# Patient Record
Sex: Female | Born: 1995 | Race: White | Hispanic: No | Marital: Married | Smoking: Former smoker
Health system: Southern US, Community
[De-identification: ages and names within clinical notes are randomized; demographics above are authoritative.]

## PROBLEM LIST (undated history)

## (undated) DIAGNOSIS — F3281 Premenstrual dysphoric disorder: Secondary | ICD-10-CM

## (undated) DIAGNOSIS — O99891 Other specified diseases and conditions complicating pregnancy: Secondary | ICD-10-CM

## (undated) DIAGNOSIS — Z8659 Personal history of other mental and behavioral disorders: Secondary | ICD-10-CM

## (undated) DIAGNOSIS — A6009 Herpesviral infection of other urogenital tract: Secondary | ICD-10-CM

## (undated) DIAGNOSIS — O9989 Other specified diseases and conditions complicating pregnancy, childbirth and the puerperium: Secondary | ICD-10-CM

## (undated) DIAGNOSIS — F329 Major depressive disorder, single episode, unspecified: Secondary | ICD-10-CM

## (undated) DIAGNOSIS — E7800 Pure hypercholesterolemia, unspecified: Secondary | ICD-10-CM

## (undated) DIAGNOSIS — F419 Anxiety disorder, unspecified: Secondary | ICD-10-CM

## (undated) DIAGNOSIS — F32A Depression, unspecified: Secondary | ICD-10-CM

## (undated) HISTORY — DX: Premenstrual dysphoric disorder: F32.81

## (undated) HISTORY — PX: CERVICAL BIOPSY: SHX590

## (undated) HISTORY — DX: Anxiety disorder, unspecified: F41.9

## (undated) HISTORY — DX: Pure hypercholesterolemia, unspecified: E78.00

## (undated) HISTORY — DX: Depression, unspecified: F32.A

## (undated) HISTORY — PX: OTHER SURGICAL HISTORY: SHX169

---
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
## 2015-02-09 ENCOUNTER — Emergency Department: Admit: 2015-02-09 | Disposition: A | Discharge status: Home / Self Care | Payer: Self-pay | Admitting: Internal Medicine

## 2015-02-09 LAB — MONONUCLEOSIS SCREEN: MONO TEST: NEGATIVE

## 2015-02-09 LAB — CBC
HCT: 41.1 % (ref 35.0–47.0)
HGB: 13.7 g/dL (ref 12.0–16.0)
MCH: 31.1 pg (ref 26.0–34.0)
MCHC: 33.2 g/dL (ref 32.0–36.0)
MCV: 94 fL (ref 80–100)
Platelet: 207 10*3/uL (ref 150–440)
RBC: 4.39 10*6/uL (ref 3.80–5.20)
RDW: 12.5 % (ref 11.5–14.5)
WBC: 14.7 10*3/uL — ABNORMAL HIGH (ref 3.6–11.0)

## 2015-02-11 LAB — BETA STREP CULTURE(ARMC)

## 2015-03-07 NOTE — H&P (Signed)
L&D Evaluation:  History:  HPI Pt is an 19 yo G1P0 at 40.[redacted] weeks GA. She presents to L&D with reports of ctx that started at 12:00pm today. She reports +FM, denies vb, lof. She is A+, VI, RI, and GBS-.   Presents with contractions   Patient's Medical History No Chronic Illness  hx of gonorrhea and chlamydia in 2011   Patient's Surgical History none   Medications Pre Natal Vitamins   Allergies NKDA   Social History none   Family History Non-Contributory   ROS:  ROS All systems were reviewed.  HEENT, CNS, GI, GU, Respiratory, CV, Renal and Musculoskeletal systems were found to be normal.   Exam:  Vital Signs stable   General no apparent distress   Mental Status clear   Chest clear   Heart normal sinus rhythm   Abdomen gravid, tender with contractions   Back no CVAT   Edema no edema   Pelvic cervix at 1525- 2/70/0   cervix at 1645 2/70/0   Mebranes Intact   FHT normal rate with no decels, cat 1 fht   Fetal Heart Rate 120   Ucx regular, 4-6 min   Skin dry, no lesions   Lymph no lymphadenopathy   Impression:  Impression reactive NST, IUP at 40.2, no cervical change   Plan:  Plan discharge, therapeutic rest, labor precautions given.   Follow Up Appointment need to schedule   Electronic Signatures: Jannet MantisSubudhi, Kacie Huxtable (CNM)  (Signed 28-Sep-14 17:00)  Authored: L&D Evaluation   Last Updated: 28-Sep-14 17:00 by Jannet MantisSubudhi, Alese Furniss (CNM)

## 2015-03-07 NOTE — H&P (Signed)
L&D Evaluation:  History Expanded:  HPI 19 yo G1P0 at 39 weeks w contractions since MN, mild and irreg, a little stronger this pm.  No vb or rom.  [facility].   Gravida 1   Term 0   Blood Type (Maternal) A positive   Maternal Varicella Immune   Rubella Results (Maternal) immune   Presents with contractions   Patient's Medical History No Chronic Illness   Patient's Surgical History none   Medications Pre Natal Vitamins   Allergies NKDA   Social History none   Family History Non-Contributory   ROS:  ROS All systems were reviewed.  HEENT, CNS, GI, GU, Respiratory, CV, Renal and Musculoskeletal systems were found to be normal.   Exam:  Vital Signs stable   General no apparent distress   Mental Status clear   Abdomen gravid, non-tender   Estimated Fetal Weight Average for gestational age   Back no CVAT   Edema no edema   Pelvic no external lesions, CL/70/-3   Mebranes Intact   FHT normal rate with no decels   Ucx irregular   Impression:  Impression early labor, vs false labor   Plan:  Plan EFM/NST, monitor contractions and for cervical change   Follow Up Appointment already scheduled   Electronic Signatures: Letitia LibraHarris, Jorrell Kuster Paul (MD)  (Signed 19-Sep-14 18:18)  Authored: L&D Evaluation   Last Updated: 19-Sep-14 18:18 by Letitia LibraHarris, Tarek Cravens Paul (MD)

## 2015-03-07 NOTE — H&P (Signed)
L&D Evaluation:  History:  HPI Pt is an 19 yo G1P0 at 40.[redacted] weeks GA by an EDC=07/23/2013 by LMP=10/16/2012. She presents to L&D with reports of ctx that started at 12:00pm yesterday and worsened this AM. She was seen yesterday in L&D and  was 3cm dilated.  On presentation she was 3 /80%/-1, but has now progressed to 5-6cm with ambulation and showering. She reports +FM, denies vb, lof.  She is A+, VI, RI, and GBS-. PNC remarkable for teen pregnancy, a first trimester ultrasound confirming dates, an echogenic intracardiac  focus in fetal heart with a normal fetal echo, and an elevated HSV IGGII (conversion since 2011). She was begun on Valtrex prophyllaxis during later pregnancy.   Presents with contractions   Patient's Medical History No Chronic Illness  hx of gonorrhea and chlamydia in 2011.  Positive HSV II  IGG   Patient's Surgical History none   Medications Pre Natal Vitamins  Valtrex 500 mgm daily   Allergies NKDA   Social History none   Family History Non-Contributory   ROS:  ROS see HPI   Exam:  Vital Signs stable   Urine Protein not completed   General breathing thru contractions   Mental Status clear   Chest clear   Heart normal sinus rhythm, no murmur/gallop/rubs   Abdomen gravid, tender with contractions   Estimated Fetal Weight Average for gestational age   Fetal Position cephalic   Edema trace   Pelvic no external lesions, 5-6/90%/0   Mebranes Intact   FHT normal rate with no decels, 130s with accels to 160s to 170s   FHT Description CAt 1   Ucx regular, q3-4 min apart   Skin dry   Impression:  Impression IUP at 40.3 weeks in active labor   Plan:  Plan EFM/NST, monitor contractions and for cervical change, Intermittent monitoring OK if up ambulating or in shower Stadol for pain if desires or epidural..   Electronic Signatures: Trinna BalloonGutierrez, Cully Luckow L (CNM)  (Signed 29-Sep-14 15:46)  Authored: L&D Evaluation   Last Updated: 29-Sep-14 15:46  by Trinna BalloonGutierrez, Shaunda Tipping L (CNM)

## 2015-07-31 ENCOUNTER — Ambulatory Visit: Payer: Self-pay | Admitting: Urology

## 2015-07-31 ENCOUNTER — Encounter: Payer: Self-pay | Admitting: Urology

## 2015-08-10 ENCOUNTER — Ambulatory Visit (INDEPENDENT_AMBULATORY_CARE_PROVIDER_SITE_OTHER): Payer: Self-pay | Admitting: Obstetrics and Gynecology

## 2015-08-10 ENCOUNTER — Encounter: Payer: Self-pay | Admitting: Obstetrics and Gynecology

## 2015-08-10 VITALS — BP 104/73 | HR 73 | Resp 16 | Ht 62.0 in | Wt 107.0 lb

## 2015-08-10 DIAGNOSIS — R300 Dysuria: Secondary | ICD-10-CM

## 2015-08-10 LAB — URINALYSIS, COMPLETE
BILIRUBIN UA: NEGATIVE
Glucose, UA: NEGATIVE
Ketones, UA: NEGATIVE
NITRITE UA: NEGATIVE
PH UA: 6.5 (ref 5.0–7.5)
Protein, UA: NEGATIVE
RBC, UA: NEGATIVE
Specific Gravity, UA: 1.015 (ref 1.005–1.030)
UUROB: 0.2 mg/dL (ref 0.2–1.0)

## 2015-08-10 LAB — MICROSCOPIC EXAMINATION
RBC MICROSCOPIC, UA: NONE SEEN /HPF (ref 0–?)
Renal Epithel, UA: NONE SEEN /hpf

## 2015-08-10 NOTE — Patient Instructions (Addendum)
Start daily probiotics Start taking antiviral medications daily for herpes outbreak prevention. Increase daily water intake and follow IC dietary recommendations. Keep a bladder diary for a few days to monitor your symptoms.  Follow up with your Gyn doctor as needed for heavy menses.

## 2015-08-10 NOTE — Progress Notes (Signed)
08/10/2015 11:53 AM   Chelsea Parks 1996/03/05 [id number]  Referring provider: No referring provider defined for this encounter.  Chief Complaint  Patient presents with  . Dysuria    [facility]  . Establish Care    HPI: Patient is a 19 year old female is in today with her mother as a referral from her primary care provider for 2 month history of urinary frequency and intermittent dysuria. She reports small volume voids approximately every 2 hours. Nocturia 1-2 times per night. She states that she recently stopped using her NuvaRing for contraception.  Patient was diagnosed and treated for chlamydial infection 2 months ago. She reports that her test of cure was negative. She also had a recent genital herpes outbreak which she reports has resolved. After treatment for her chlamydial infection she was also diagnosed with a vaginal yeast infection. She states that she has continued to experience some increase in vaginal discharge. She denies any vaginal itching or pain.  He reports that her general herpes was diagnosed approximately 2 years ago. She was prescribed daily suppressive therapy but states that she has not been taking it as directed and only takes it when she has an outbreak.  Distal symptoms include occasional stress urinary incontinence. Patient reports that symptoms have occurred since she gave birth to her baby 2 years ago.  Daily H2O intake little to none.  PMH: No past medical history on file.  Surgical History: Past Surgical History  Procedure Laterality Date  . No surgical history      Home Medications:    Medication List       This list is accurate as of: 08/10/15 11:53 AM.  Always use your most recent med list.               etonogestrel-ethinyl estradiol 0.12-0.015 MG/24HR vaginal ring  Commonly known as:  NUVARING  Place 1 each vaginally every 28 (twenty-eight) days. Insert vaginally and leave in place for 3  consecutive weeks, then remove for 1 week.        Allergies: No Known Allergies  Family History: Family History  Problem Relation Age of Onset  . Lupus Maternal Aunt   . Pancreatic cancer Maternal Uncle   . Stroke Maternal Grandfather   . Kidney cancer Neg Hx     Social History:  reports that she has never smoked. She does not have any smokeless tobacco history on file. She reports that she does not drink alcohol or use illicit drugs.  ROS: UROLOGY Frequent Urination?: Yes Hard to postpone urination?: Yes Burning/pain with urination?: Yes Get up at night to urinate?: Yes Leakage of urine?: Yes Urine stream starts and stops?: Yes Trouble starting stream?: Yes Do you have to strain to urinate?: Yes Blood in urine?: No Urinary tract infection?: No Sexually transmitted disease?: No Injury to kidneys or bladder?: No Painful intercourse?: No Weak stream?: Yes Currently pregnant?: No Vaginal bleeding?: No Last menstrual period?: n/a  Gastrointestinal Nausea?: Yes Vomiting?: No Indigestion/heartburn?: No Diarrhea?: No Constipation?: No  Constitutional Fever: No Night sweats?: No Weight loss?: No Fatigue?: Yes  Skin Skin rash/lesions?: No Itching?: No  Eyes Blurred vision?: No Double vision?: No  Ears/Nose/Throat Sore throat?: No Sinus problems?: No  Hematologic/Lymphatic Swollen glands?: No Easy bruising?: No  Cardiovascular Leg swelling?: No Chest pain?: No  Respiratory Cough?: No Shortness of breath?: No  Endocrine Excessive thirst?: No  Musculoskeletal Back pain?: No Joint pain?: No  Neurological Headaches?: Yes Dizziness?: No  Psychologic Depression?: No Anxiety?: No  Physical Exam: BP 104/73 mmHg  Pulse 73  Resp 16  Ht  (1.575 m)  Wt 107 lb (48.535 kg)  BMI 19.57 kg/m2  Constitutional:  Alert and oriented, No acute distress. HEENT: [place] AT, moist mucus membranes.  Trachea midline, no masses. Cardiovascular: No  clubbing, cyanosis, or edema. Respiratory: Normal respiratory effort, no increased work of breathing. GI: Abdomen is soft, nontender, nondistended, no abdominal masses GU: No CVA tenderness. Pelvic:normal external genitalia  Mild CMT, moderate cervical discharge (light green in color), no rashes or lesions, urethra normal, minimal cystocele with no demonstrable SUI Skin: No rashes, bruises or suspicious lesions. Lymph: No cervical or inguinal adenopathy. Neurologic: Grossly intact, no focal deficits, moving all 4 extremities. Psychiatric: Normal mood and affect.  Laboratory Data:   Urinalysis No results found for: COLORURINE, APPEARANCEUR, LABSPEC, PHURINE, GLUCOSEU, HGBUR, BILIRUBINUR, KETONESUR, PROTEINUR, UROBILINOGEN, NITRITE, LEUKOCYTESUR  Pertinent Imaging:  Assessment & Plan:    1. Dysuria/Urinary Frequency- Urinary frequency and intermittent dysuria over the last 2 months. UA unremarkable today. I believed patient's urinary symptoms are most likely due to residual urethral inflammation related to her recent vaginal chlamydial/yeast infections as well as genital herpes outbreak. She was instructed to significantly increase her daily water intake and start a probiotic. I also provided her with a bladder diary for her to document her symptoms and fluid intake for a few days to bring to her next appointment. Patient also provided a list of dietary bladder irritants to avoid. - Urinalysis, Complete -Urine Culture  3. H/o Chlamydial infection- History of recent chlamydial infection. Patient states that her test of cure was negative but today she is continues to have mild cervical motion tenderness as well as a light greenish discharge. I have sent her urine today for GC chlamydial testing.  4. Genital Herpes- Patient instructed to start taking her suppressive antivirals daily as directed.  5. SUI-  she reports minimal episodes of stress urinary Incontinence. Minimal cystocele noted on  exam today. I advised her to begin performing regular Ke[place] exercises to strengthen her pelvic floor.  Return in about 1 month (around 09/10/2015) for recheck frequency and dysuria.  These notes generated with voice recognition software. I apologize for typographical errors.  Earlie Lou, FNP  [facility] [address] [place], [place] [address] [phone]

## 2015-08-12 LAB — CHLAMYDIA/GONOCOCCUS/TRICHOMONAS, NAA
Chlamydia by NAA: POSITIVE — AB
GONOCOCCUS BY NAA: NEGATIVE
Trich vag by NAA: NEGATIVE

## 2015-08-13 LAB — CULTURE, URINE COMPREHENSIVE

## 2015-08-14 ENCOUNTER — Telehealth: Payer: Self-pay

## 2015-08-14 DIAGNOSIS — A749 Chlamydial infection, unspecified: Secondary | ICD-10-CM

## 2015-08-14 MED ORDER — AZITHROMYCIN 1 G PO PACK: 1.0000 g | PACK | Freq: Once | ORAL | Status: DC

## 2015-08-14 NOTE — Telephone Encounter (Signed)
Spoke with pt in reference to chlamydia infection. Made pt aware for the need of further treatment, no sexual contact for a week post tx, partner needs to be treated, and the need to see PCP. Pt voiced understanding. Medication sent to pharmacy. Infection reported to local health dept.

## 2015-08-14 NOTE — Telephone Encounter (Signed)
-----   Message from Fernanda DrumLindsay C Overton, FNP sent at 08/14/2015  8:47 AM EDT ----- Please notify patient that her urine culture was negative for infection.  Her chlamydia test did come back positive. Either the patient was not appropriately treated initially in the infection didn't not resolve or she has been reexposed. Please send in a prescription for her to take azithromycin 1 g as a single dose. She needs to avoid any sexual activity until at least one week after she takes her antibiotics. Her partner needs to also be treated prior to resuming sexual intercourse and she is to use condoms with every sexual encounter. She needs to see her primary care provider and be tested for all STI's because if she has been exposed to chlamydia she could have also been exposed to other infections or viruses. The continued infection is also likely contributing to patient's urinary symptoms. Please report infection to the CDC.  thanks

## 2015-08-24 ENCOUNTER — Telehealth: Payer: Self-pay

## 2015-08-24 ENCOUNTER — Other Ambulatory Visit: Payer: Self-pay

## 2015-08-24 DIAGNOSIS — A749 Chlamydial infection, unspecified: Secondary | ICD-10-CM

## 2015-08-24 MED ORDER — AZITHROMYCIN 1 G PO PACK: 1.0000 g | PACK | Freq: Once | ORAL | Status: DC

## 2015-08-24 NOTE — Telephone Encounter (Signed)
Pt called c/o that her abx was not called in to her pharmacy nor reported to the health dept. Nurse made pt aware [facility]x was [facility] [place] and her case had been reported to the Health Dept. Pt stated that "I spent a whole bunch of money to come see yall thinking it was something serious when really it was just chlamydia and now I have a big bill that I cant pay." Pt then went on to say that "I have had chlamydia for a long time and I just cant get rid of it. [facility] told me I did not have it and yall say I do, so I dont know what to do." Nurse reinforced with pt the need to [facility] at [facility], complete the therapy, and not to have any sexual contact until infection is cleared. Pt stated "well I have sex almost every day and have been since I was originally told I had chlamydia." Nurse reinforced with pt the need to complete abx therapy and use protection during intercourse. Pt stated "yeah well ok bye" and hung up.

## 2015-08-25 ENCOUNTER — Telehealth: Payer: Self-pay

## 2015-08-25 DIAGNOSIS — B379 Candidiasis, unspecified: Secondary | ICD-10-CM

## 2015-08-25 MED ORDER — FLUCONAZOLE 150 MG PO TABS: 150.0000 mg | ORAL_TABLET | Freq: Once | ORAL | Status: DC

## 2015-08-25 NOTE — Telephone Encounter (Signed)
Medication sent to pharmacy  

## 2015-08-25 NOTE — Telephone Encounter (Signed)
Pt called stating if she has to take the abx for chlamydia then she needs a diflucan. Please advise.

## 2015-08-25 NOTE — Telephone Encounter (Signed)
Please send in a Diflucan 150 mg once.  thanks

## 2015-09-15 ENCOUNTER — Ambulatory Visit: Payer: Self-pay | Admitting: Obstetrics and Gynecology

## 2015-09-15 ENCOUNTER — Encounter: Payer: Self-pay | Admitting: Obstetrics and Gynecology

## 2015-10-29 NOTE — L&D Delivery Note (Signed)
Delivery Note At 6:13 AM a viable female was delivered via Vaginal, Spontaneous Delivery (Presentation: Direct OA).  APGAR: 8, 9; weight 7 lb 13.2 oz (3550 g).   Placenta status: delivered spontaneously, intact with 3 vessel cord, .  Cord:  N/a with the following complications: none.    Anesthesia:  Epidural Episiotomy: None Lacerations: None Suture Repair: n/a Est. Blood Loss (mL): 300  Called to see patient.  Mom pushed to deliver a viable female infant.  The head followed by shoulders, which delivered without difficulty, and the rest of the body.  No nuchal cord noted.  Baby to mom's chest.  Cord clamped and cut after > 1 min delay.  No cord blood obtained.  Placenta delivered spontaneously, intact, with a 3-vessel cord.  No perineal lacerations noted.  All counts correct.  Hemostasis obtained with IV pitocin and fundal massage. EBL 300 mL.    Mom to postpartum.  Baby to Couplet care / Skin to Skin.  [name] 05/29/2016, 7:08 AM

## 2015-11-02 LAB — OB RESULTS CONSOLE RUBELLA ANTIBODY, IGM: Rubella: IMMUNE

## 2015-11-02 LAB — OB RESULTS CONSOLE HEPATITIS B SURFACE ANTIGEN: Hepatitis B Surface Ag: NEGATIVE

## 2015-11-02 LAB — OB RESULTS CONSOLE VARICELLA ZOSTER ANTIBODY, IGG: VARICELLA IGG: IMMUNE

## 2015-11-02 LAB — OB RESULTS CONSOLE HIV ANTIBODY (ROUTINE TESTING): HIV: NONREACTIVE

## 2016-03-04 LAB — OB RESULTS CONSOLE RPR: RPR: NONREACTIVE

## 2016-05-28 ENCOUNTER — Inpatient Hospital Stay: Payer: Medicaid Other | Admitting: Anesthesiology

## 2016-05-28 ENCOUNTER — Encounter: Payer: Self-pay | Admitting: Anesthesiology

## 2016-05-28 ENCOUNTER — Encounter: Payer: Self-pay | Admitting: Certified Nurse Midwife

## 2016-05-28 ENCOUNTER — Inpatient Hospital Stay
Admission: EM | Admit: 2016-05-28 | Discharge: 2016-05-30 | DRG: 775 | Disposition: A | Discharge status: Home / Self Care | Payer: Medicaid Other | Attending: Certified Nurse Midwife | Admitting: Certified Nurse Midwife

## 2016-05-28 DIAGNOSIS — Z823 Family history of stroke: Secondary | ICD-10-CM | POA: Diagnosis not present

## 2016-05-28 DIAGNOSIS — Z800 Family history of malignant neoplasm of digestive organs: Secondary | ICD-10-CM

## 2016-05-28 DIAGNOSIS — Z79899 Other long term (current) drug therapy: Secondary | ICD-10-CM | POA: Diagnosis not present

## 2016-05-28 DIAGNOSIS — Z3A40 40 weeks gestation of pregnancy: Secondary | ICD-10-CM | POA: Diagnosis not present

## 2016-05-28 HISTORY — DX: Personal history of other mental and behavioral disorders: Z86.59

## 2016-05-28 HISTORY — DX: Other specified diseases and conditions complicating pregnancy: O99.891

## 2016-05-28 HISTORY — DX: Other specified diseases and conditions complicating pregnancy, childbirth and the puerperium: O99.89

## 2016-05-28 HISTORY — DX: Herpesviral infection of other urogenital tract: A60.09

## 2016-05-28 LAB — TYPE AND SCREEN
ABO/RH(D): A POS
ANTIBODY SCREEN: NEGATIVE

## 2016-05-28 LAB — CBC
HCT: 36.3 % (ref 35.0–47.0)
Hemoglobin: 12.9 g/dL (ref 12.0–16.0)
MCH: 32.7 pg (ref 26.0–34.0)
MCHC: 35.4 g/dL (ref 32.0–36.0)
MCV: 92.5 fL (ref 80.0–100.0)
Platelets: 244 10*3/uL (ref 150–440)
RBC: 3.93 MIL/uL (ref 3.80–5.20)
RDW: 13.2 % (ref 11.5–14.5)
WBC: 21.3 10*3/uL — ABNORMAL HIGH (ref 3.6–11.0)

## 2016-05-28 LAB — CHLAMYDIA/NGC RT PCR (ARMC ONLY)
Chlamydia Tr: NOT DETECTED
N GONORRHOEAE: NOT DETECTED

## 2016-05-28 LAB — OB RESULTS CONSOLE GC/CHLAMYDIA
CHLAMYDIA, DNA PROBE: NEGATIVE
Gonorrhea: NEGATIVE

## 2016-05-28 MED ORDER — OXYTOCIN 40 UNITS IN LACTATED RINGERS INFUSION - SIMPLE MED
INTRAVENOUS | Status: AC
  Administered 2016-05-29: 2 m[IU]/min via INTRAVENOUS
  Filled 2016-05-28: qty 1000

## 2016-05-28 MED ORDER — MISOPROSTOL 200 MCG PO TABS
800.0000 ug | ORAL_TABLET | Freq: Once | ORAL | Status: DC | PRN
  Filled 2016-05-28: qty 4

## 2016-05-28 MED ORDER — BUTORPHANOL TARTRATE 1 MG/ML IJ SOLN
1.0000 mg | INTRAMUSCULAR | Status: DC | PRN
Start: 2016-05-28 — End: 2016-05-29
  Administered 2016-05-28: 1 mg via INTRAVENOUS
  Filled 2016-05-28: qty 1

## 2016-05-28 MED ORDER — FENTANYL 2.5 MCG/ML W/ROPIVACAINE 0.2% IN NS 100 ML EPIDURAL INFUSION (ARMC-ANES)
EPIDURAL | Status: DC | PRN
  Administered 2016-05-28: 9 mL/h via EPIDURAL
  Administered 2016-05-29: 250 ug via EPIDURAL

## 2016-05-28 MED ORDER — CEFAZOLIN SODIUM-DEXTROSE 2-3 GM-% IV SOLR
2.0000 g | Freq: Two times a day (BID) | INTRAVENOUS | Status: DC
  Administered 2016-05-28: 2 g via INTRAVENOUS
  Filled 2016-05-28 (×2): qty 50

## 2016-05-28 MED ORDER — AMMONIA AROMATIC IN INHA
RESPIRATORY_TRACT | Status: DC
Start: 2016-05-28 — End: 2016-05-29
  Filled 2016-05-28: qty 10

## 2016-05-28 MED ORDER — FENTANYL 2.5 MCG/ML W/ROPIVACAINE 0.2% IN NS 100 ML EPIDURAL INFUSION (ARMC-ANES)
EPIDURAL | Status: AC
  Filled 2016-05-28: qty 100

## 2016-05-28 MED ORDER — BUPIVACAINE HCL (PF) 0.25 % IJ SOLN
INTRAMUSCULAR | Status: DC | PRN
  Administered 2016-05-28 (×2): 4 mL via EPIDURAL

## 2016-05-28 MED ORDER — LIDOCAINE-EPINEPHRINE (PF) 1.5 %-1:200000 IJ SOLN
INTRAMUSCULAR | Status: DC | PRN
  Administered 2016-05-28: 3 mL

## 2016-05-28 MED ORDER — MISOPROSTOL 200 MCG PO TABS
ORAL_TABLET | ORAL | Status: DC
Start: 2016-05-28 — End: 2016-05-29
  Filled 2016-05-28: qty 4

## 2016-05-28 MED ORDER — LIDOCAINE HCL (PF) 1 % IJ SOLN: 30.0000 mL | INTRAMUSCULAR | Status: DC | PRN

## 2016-05-28 MED ORDER — OXYTOCIN 40 UNITS IN LACTATED RINGERS INFUSION - SIMPLE MED
2.5000 [IU]/h | INTRAVENOUS | Status: DC
  Filled 2016-05-28: qty 1000

## 2016-05-28 MED ORDER — LACTATED RINGERS IV SOLN
500.0000 mL | INTRAVENOUS | Status: DC | PRN
Start: 2016-05-28 — End: 2016-05-29

## 2016-05-28 MED ORDER — CEFAZOLIN SODIUM-DEXTROSE 2-4 GM/100ML-% IV SOLN
INTRAVENOUS | Status: AC
  Administered 2016-05-28: 2000 mg
  Filled 2016-05-28: qty 100

## 2016-05-28 MED ORDER — LACTATED RINGERS IV SOLN
INTRAVENOUS | Status: DC
  Administered 2016-05-28: 125 mL/h via INTRAVENOUS
  Administered 2016-05-29: 01:00:00 via INTRAVENOUS

## 2016-05-28 MED ORDER — ONDANSETRON HCL 4 MG/2ML IJ SOLN: 4.0000 mg | Freq: Four times a day (QID) | INTRAMUSCULAR | Status: DC | PRN

## 2016-05-28 MED ORDER — OXYTOCIN 10 UNIT/ML IJ SOLN
INTRAMUSCULAR | Status: DC
Start: 2016-05-28 — End: 2016-05-29
  Filled 2016-05-28: qty 2

## 2016-05-28 MED ORDER — AMMONIA AROMATIC IN INHA: 0.3000 mL | Freq: Once | RESPIRATORY_TRACT | Status: DC | PRN

## 2016-05-28 MED ORDER — FLUCONAZOLE 50 MG PO TABS
150.0000 mg | ORAL_TABLET | Freq: Once | ORAL | Status: AC
Start: 2016-05-28 — End: 2016-05-28
  Administered 2016-05-28: 150 mg via ORAL
  Filled 2016-05-28: qty 3

## 2016-05-28 MED ORDER — LIDOCAINE HCL (PF) 1 % IJ SOLN
INTRAMUSCULAR | Status: AC
  Filled 2016-05-28: qty 30

## 2016-05-28 MED ORDER — LACTATED RINGERS IV SOLN: 500.0000 mL | INTRAVENOUS | Status: DC | PRN

## 2016-05-28 MED ORDER — OXYTOCIN BOLUS FROM INFUSION
500.0000 mL | Freq: Once | INTRAVENOUS | Status: AC
  Administered 2016-05-29: 500 mL via INTRAVENOUS

## 2016-05-28 NOTE — H&P (Signed)
OB History & Physical   History of Present Illness:  Chief Complaint:  Strong contractions since 0730 this morning. HPI:  Chelsea Parks is a 20 y.o. G1P0 female with EDC=05/29/2016 at [redacted]w[redacted]d dated by a 11 week ultrasound.  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 Marland Kitchen  She is currently taking Valtrex daily for PPX. She presents to L&D for evaluation of labor.   She denies any bleeding or LOF. Has a little vulvar itching. Baby active.    Prenatal care site: Prenatal care at [facility] has also  been remarkable for a normal anatomy scan, receiving TDAP on 03/28/2016, and a history of postpartum depression with G1. Desires to breast and bottle feed.      Maternal Medical History:   Past Medical History:  Diagnosis Date  . H/O postpartum depression, currently pregnant   . Herpes genitalis in women     Past Surgical History:  Procedure Laterality Date  . no surgical history      No Known Allergies  Prior to Admission medications   Valtrex 500 mgm daily Prenatal vitamins daily          Social History: She  reports that she has never smoked. She does not have any smokeless tobacco history on file. She reports that she does not drink alcohol or use drugs.  Family History: family history includes Lupus in her maternal aunt; Pancreatic cancer in her maternal uncle; Stroke in her maternal grandfather.   Review of Systems: Negative x 10 systems reviewed except as noted in the HPI.      Physical Exam:  Vital Signs: BP 119/71   Pulse 97   Temp 97.6 F (36.4 C) (Oral)   Ht 5\' 2"  (1.575 m)   Wt 62.6 kg (138 lb)   BMI 25.24 kg/m  General: appears uncomfortable, breathing thru contractions. HEENT: normocephalic, atraumatic Heart: regular rate & rhythm.  No murmurs Lungs: clear to auscultation bilaterally Abdomen: soft, gravid, non-tender;  EFW: 7# Pelvic:   External: Normal external female genitalia, no lesions seen, discharge yellowish mucoid  Vagina:  yellow mucoid discharge  Wet prep positive for hyphae  Cervix: 1.5/90%/-2vtx on RN exam on arrival, and then 2 hours later  3/C/-1  Extremities: non-tender, symmetric,no edema bilaterally.  DTRs: +1 to +2  Neurologic: Alert & oriented x 3.    Pertinent Results:  Prenatal Labs: Blood type/Rh A positive  Antibody screen negative  Rubella Varicella Immune immune  RPR Non reactive  HBsAg negative  HIV nonreactive  GC negative  Chlamydia negative  Genetic screening CF negative  1 hour GTT 73  3 hour GTT NA  GBS negative on 05/03/2016   Baseline FHR: 120s with accelerations to 150 and moderate variability Toco: q2-20min apart with some coupling  Placental Location: posterior on anatomy scan  Assessment:  Chelsea Parks is a 20 y.o. G1P0 female at [redacted]w[redacted]d in early labor.   No evidence of herpetic lesions, monilial vulvovaginitis  Plan:  1. Admit to Labor & Delivery - notify attending   2. CBC, T&S, Clrs, IVF 3. GBS negative.   4. Consents obtained. 5. Diflucan x 1 dose  6. Epidural when appropriate  Kentaro Alewine  05/28/2016 12:43 PM

## 2016-05-28 NOTE — OB Triage Note (Signed)
G2 P1 EDC 05/29/2016 [facility] with complaint of uc's since 0730 this am, now Q 5 - 6 mins apart. Pt states active fetal movement noted this morning. Denies leaking fluid, or spotting. Pt placed on EFM for evaluation of contraction pattern Pt states GBS neg but will ask provider to assess records. Will notify [facility] CNM of patients arrival to unit. Ellison Carwin RNC

## 2016-05-28 NOTE — Anesthesia Preprocedure Evaluation (Signed)
Anesthesia Evaluation  Patient identified by MRN, date of birth, ID band Patient awake    Reviewed: Allergy & Precautions, H&P , Patient's Chart, lab work & pertinent test results  History of Anesthesia Complications Negative for: history of anesthetic complications  Airway Mallampati: II       Dental no notable dental hx. (+) Teeth Intact   Pulmonary    Pulmonary exam normal        Cardiovascular Normal cardiovascular exam     Neuro/Psych    GI/Hepatic negative GI ROS,   Endo/Other    Renal/GU      Musculoskeletal   Abdominal   Peds  Hematology negative hematology ROS (+)   Anesthesia Other Findings   Reproductive/Obstetrics (+) Pregnancy                             Anesthesia Physical Anesthesia Plan  ASA: II  Anesthesia Plan: Epidural   Post-op Pain Management:    Induction:   Airway Management Planned:   Additional Equipment:   Intra-op Plan:   Post-operative Plan:   Informed Consent: I have reviewed the patients History and Physical, chart, labs and discussed the procedure including the risks, benefits and alternatives for the proposed anesthesia with the patient or authorized representative who has indicated his/her understanding and acceptance.     Plan Discussed with: Anesthesiologist  Anesthesia Plan Comments:         Anesthesia Quick Evaluation

## 2016-05-28 NOTE — Anesthesia Procedure Notes (Addendum)
Epidural Patient location during procedure: OB Start time: 05/28/2016 2:30 PM End time: 05/28/2016 2:39 PM  Staffing Anesthesiologist: Yves Dill Resident/CRNA: Malva Cogan Performed: resident/CRNA   Preanesthetic Checklist Completed: patient identified, site marked, surgical consent, pre-op evaluation, IV checked, risks and benefits discussed and monitors and equipment checked  Epidural Patient position: sitting Prep: Betadine Patient monitoring: heart rate, continuous pulse ox and blood pressure Approach: midline Location: L3-L4 Injection technique: LOR saline  Needle:  Needle type: Tuohy  Needle gauge: 17 G Needle length: 9 cm Needle insertion depth: 5 cm Catheter type: closed end flexible Catheter size: 19 Gauge Catheter at skin depth: 9 cm Test dose: negative and 1.5% lidocaine with Epi 1:200 K  Assessment Events: blood not aspirated, injection not painful, no injection resistance, negative IV test and no paresthesia  Additional Notes Reason for block:procedure for pain

## 2016-05-29 ENCOUNTER — Encounter: Payer: Self-pay | Admitting: Obstetrics and Gynecology

## 2016-05-29 LAB — RPR: RPR: NONREACTIVE

## 2016-05-29 MED ORDER — IBUPROFEN 600 MG PO TABS
600.0000 mg | ORAL_TABLET | Freq: Four times a day (QID) | ORAL | Status: DC
  Administered 2016-05-29 (×2): 600 mg via ORAL
  Filled 2016-05-29 (×2): qty 1

## 2016-05-29 MED ORDER — ACETAMINOPHEN 325 MG PO TABS
650.0000 mg | ORAL_TABLET | ORAL | Status: DC | PRN
Start: 2016-05-29 — End: 2016-05-30

## 2016-05-29 MED ORDER — DIBUCAINE 1 % RE OINT: 1.0000 "application " | TOPICAL_OINTMENT | RECTAL | Status: DC | PRN

## 2016-05-29 MED ORDER — TERBUTALINE SULFATE 1 MG/ML IJ SOLN: 0.2500 mg | Freq: Once | INTRAMUSCULAR | Status: DC | PRN

## 2016-05-29 MED ORDER — FENTANYL 2.5 MCG/ML W/ROPIVACAINE 0.2% IN NS 100 ML EPIDURAL INFUSION (ARMC-ANES)
EPIDURAL | Status: AC
  Filled 2016-05-29: qty 100

## 2016-05-29 MED ORDER — ONDANSETRON HCL 4 MG/2ML IJ SOLN: 4.0000 mg | INTRAMUSCULAR | Status: DC | PRN

## 2016-05-29 MED ORDER — HYDROCODONE-ACETAMINOPHEN 5-325 MG PO TABS
1.0000 | ORAL_TABLET | Freq: Four times a day (QID) | ORAL | Status: DC | PRN
  Administered 2016-05-29: 1 via ORAL
  Filled 2016-05-29: qty 1

## 2016-05-29 MED ORDER — IBUPROFEN 600 MG PO TABS
ORAL_TABLET | ORAL | Status: AC
  Administered 2016-05-29: 600 mg
  Filled 2016-05-29: qty 1

## 2016-05-29 MED ORDER — ONDANSETRON HCL 4 MG PO TABS: 4.0000 mg | ORAL_TABLET | ORAL | Status: DC | PRN

## 2016-05-29 MED ORDER — DIPHENHYDRAMINE HCL 25 MG PO CAPS: 25.0000 mg | ORAL_CAPSULE | Freq: Four times a day (QID) | ORAL | Status: DC | PRN

## 2016-05-29 MED ORDER — OXYTOCIN 40 UNITS IN LACTATED RINGERS INFUSION - SIMPLE MED
1.0000 m[IU]/min | INTRAVENOUS | Status: DC
  Administered 2016-05-29: 2 m[IU]/min via INTRAVENOUS

## 2016-05-29 MED ORDER — BENZOCAINE-MENTHOL 20-0.5 % EX AERO: 1.0000 "application " | INHALATION_SPRAY | CUTANEOUS | Status: DC | PRN

## 2016-05-29 MED ORDER — PRENATAL MULTIVITAMIN CH
1.0000 | ORAL_TABLET | Freq: Every day | ORAL | Status: DC
  Administered 2016-05-29: 1 via ORAL
  Filled 2016-05-29: qty 1

## 2016-05-29 MED ORDER — FERROUS SULFATE 325 (65 FE) MG PO TABS
325.0000 mg | ORAL_TABLET | Freq: Two times a day (BID) | ORAL | Status: DC
  Administered 2016-05-29 – 2016-05-30 (×3): 325 mg via ORAL
  Filled 2016-05-29 (×3): qty 1

## 2016-05-29 MED ORDER — IBUPROFEN 600 MG PO TABS
600.0000 mg | ORAL_TABLET | Freq: Four times a day (QID) | ORAL | Status: DC
  Administered 2016-05-30 (×2): 600 mg via ORAL
  Filled 2016-05-29 (×2): qty 1

## 2016-05-29 MED ORDER — SENNOSIDES-DOCUSATE SODIUM 8.6-50 MG PO TABS
2.0000 | ORAL_TABLET | ORAL | Status: DC
  Administered 2016-05-29: 2 via ORAL
  Filled 2016-05-29: qty 2

## 2016-05-29 MED ORDER — SIMETHICONE 80 MG PO CHEW: 80.0000 mg | CHEWABLE_TABLET | ORAL | Status: DC | PRN

## 2016-05-29 MED ORDER — COCONUT OIL OIL: 1.0000 "application " | TOPICAL_OIL | Status: DC | PRN

## 2016-05-29 MED ORDER — WITCH HAZEL-GLYCERIN EX PADS: 1.0000 "application " | MEDICATED_PAD | CUTANEOUS | Status: DC | PRN

## 2016-05-29 NOTE — Discharge Summary (Signed)
OB Discharge Summary     Patient Name: Chelsea Parks DOB: 12/30/1995 MRN: [id number]  Date of admission: 05/28/2016 Delivering MD: [name], MD  Date of Delivery: 05/29/2016  Date of discharge: 05/30/16  Admitting diagnosis:  1) intrauterine pregnancy at [redacted]w[redacted]d  2) Labor  Intrauterine pregnancy: [redacted]w[redacted]d      Secondary diagnosis: None     Discharge diagnosis: Term Pregnancy Delivered                                                                                                Post partum procedures:none  Augmentation: Pitocin  Complications: None  Hospital course:  Onset of Labor With Vaginal Delivery     20 y.o. yo G1P0 at [redacted]w[redacted]d was admitted in Active Labor on 05/28/2016. Patient had an uncomplicated labor course as follows:  Membrane Rupture Time/Date: 3:00 PM ,05/28/2016   Intrapartum Procedures: Episiotomy: None [1]                                         Lacerations:  None [1]  Patient had a delivery of a Viable infant. 05/29/2016  Information for the patient's newborn:  Chelsea Parks [[id number]]       Pateint had an uncomplicated postpartum course.  She is ambulating, tolerating a regular diet, passing flatus, and urinating well. Patient is discharged home in stable condition on 05/29/16.    Physical exam  Vitals:   05/29/16 1918 05/29/16 2334 05/30/16 0325 05/30/16 0846  BP: 107/70 109/64 91/66 (!) 110/52  Pulse: (!) 108 96 84 93  Resp: 18 18 20 20   Temp: 98.1 F (36.7 C) 98 F (36.7 C) 97.8 F (36.6 C) 98 F (36.7 C)  TempSrc: Oral Oral Oral Oral  SpO2:    100%  Weight:      Height:       General: alert, cooperative and no distress Lochia: appropriate Uterine Fundus: firm Incision: N/A DVT Evaluation: No evidence of DVT seen on physical exam. No cords or calf tenderness. No significant calf/ankle edema.  Labs: Lab Results  Component Value Date   WBC 16.2 (H) 05/30/2016   HGB 11.1 (L) 05/30/2016   HCT 32.2 (L) 05/30/2016   MCV 94.7  05/30/2016   PLT 199 05/30/2016   Admission HCT: 36.3  No flowsheet data found.  Discharge instruction:  Discharge instructions:   Call office if you have any of the following: headache, visual changes, fever >100 F, chills, breast concerns, excessive vaginal bleeding, incision drainage or problems, leg pain or redness, depression or any other concerns.   Activity: Do not lift > 10 lbs for 6 weeks.  No intercourse or tampons for 6 weeks.  No driving for 1-2 weeks.    Medications:    Medication List    STOP taking these medications   valACYclovir 500 MG tablet Commonly known as:  VALTREX     TAKE these medications   ibuprofen 600 MG tablet Commonly known as:  ADVIL,MOTRIN Take 1 tablet (600 mg total)  by mouth every 6 (six) hours.   multivitamin-prenatal 27-0.8 MG Tabs tablet Take 1 tablet by mouth daily at 12 noon.        Diet: routine diet  Activity: Advance as tolerated. Pelvic rest for 6 weeks.   Outpatient follow up: Follow-up Information    [name], MD Follow up in 2 week(s).   Specialty:  Obstetrics and Gynecology Why:  pp depression check Contact information: [address] [place] [facility] [place] [address] [phone]            Postpartum contraception: Nexplanon Rhogam Given postpartum: no Rubella vaccine given postpartum: no Varicella vaccine given postpartum: no TDaP given antepartum or postpartum: AP  Newborn Data: Live born  Birth Weight:   APGAR: 8, 9  Baby Feeding: Bottle and Breast  Disposition:home with mother  SIGNED: 05/30/16 11:12 AM Marta Antu, CNM

## 2016-05-29 NOTE — Progress Notes (Signed)
Patient ID: Chelsea Parks, female   DOB: 1996-06-11, 20 y.o.   MRN: [id number] Labor Check  Subj:  Complaints: comfortable with epidural   Obj:  BP 95/69   Pulse (!) 111   Temp 99.4 F (37.4 C) (Oral)   Ht 5\' 2"  (1.575 m)   Wt 62.6 kg (138 lb)   BMI 25.24 kg/m     Cervix: Dilation: 8 / Effacement (%): 90 / Station: 0  Baseline FHR: 135    Variability: moderate    Accelerations: present    Decelerations: absent (distant variable decels) Contractions: present frequency: 3 q 10 min  A/P: 20 y.o. G1P0 female at [redacted]w[redacted]d with active labor, now arrested (slight change from before).  1.  Labor: add pitocin and she has made very slow progress.  2.  FWB: reassuring, Overall assessment: category 1  3.  GBS negative  4.  Pain: epidural 5.  Recheck: 2 hour prn   Thomasene Mohair, MD 05/29/2016 3:28 AM

## 2016-05-30 LAB — CBC
HEMATOCRIT: 32.2 % — AB (ref 35.0–47.0)
HEMOGLOBIN: 11.1 g/dL — AB (ref 12.0–16.0)
MCH: 32.5 pg (ref 26.0–34.0)
MCHC: 34.4 g/dL (ref 32.0–36.0)
MCV: 94.7 fL (ref 80.0–100.0)
Platelets: 199 10*3/uL (ref 150–440)
RBC: 3.41 MIL/uL — AB (ref 3.80–5.20)
RDW: 13 % (ref 11.5–14.5)
WBC: 16.2 10*3/uL — AB (ref 3.6–11.0)

## 2016-05-30 MED ORDER — IBUPROFEN 600 MG PO TABS: 600.0000 mg | ORAL_TABLET | Freq: Four times a day (QID) | ORAL | 0 refills | Status: DC

## 2016-05-30 NOTE — Progress Notes (Signed)
Discharge instructions complete and prescriptions given. Patient verbalizes understanding of teaching. Patient discharged home at 1330. 

## 2016-05-30 NOTE — Anesthesia Postprocedure Evaluation (Signed)
Anesthesia Post Note  Patient: Chelsea Parks  Procedure(s) Performed: * No procedures listed *  Patient location during evaluation: Mother Baby Anesthesia Type: Epidural Level of consciousness: awake and alert, oriented and patient cooperative Pain management: satisfactory to patient Vital Signs Assessment: post-procedure vital signs reviewed and stable Respiratory status: spontaneous breathing, respiratory function stable and nonlabored ventilation Cardiovascular status: blood pressure returned to baseline and stable Postop Assessment: no headache, no backache, patient able to bend at knees and no signs of nausea or vomiting Anesthetic complications: no    Last Vitals:  Vitals:   05/30/16 0325 05/30/16 0846  BP: 91/66 (!) 110/52  Pulse: 84 93  Resp: 20 20  Temp: 36.6 C 36.7 C    Last Pain:  Vitals:   05/30/16 0846  TempSrc: Oral  PainSc:                  Melton Krebs

## 2016-08-10 ENCOUNTER — Emergency Department
Admission: EM | Admit: 2016-08-10 | Discharge: 2016-08-10 | Disposition: A | Discharge status: Home / Self Care | Payer: Medicaid Other | Attending: Student | Admitting: Student

## 2016-08-10 DIAGNOSIS — N12 Tubulo-interstitial nephritis, not specified as acute or chronic: Secondary | ICD-10-CM | POA: Insufficient documentation

## 2016-08-10 DIAGNOSIS — R300 Dysuria: Secondary | ICD-10-CM | POA: Diagnosis present

## 2016-08-10 LAB — COMPREHENSIVE METABOLIC PANEL
ALT: 17 U/L (ref 14–54)
AST: 19 U/L (ref 15–41)
Albumin: 4.8 g/dL (ref 3.5–5.0)
Alkaline Phosphatase: 90 U/L (ref 38–126)
Anion gap: 10 (ref 5–15)
BUN: 13 mg/dL (ref 6–20)
CO2: 24 mmol/L (ref 22–32)
Calcium: 9.5 mg/dL (ref 8.9–10.3)
Chloride: 105 mmol/L (ref 101–111)
Creatinine, Ser: 0.84 mg/dL (ref 0.44–1.00)
GFR calc Af Amer: >60 mL/min (ref >60)
GFR calc non Af Amer: >60 mL/min (ref >60)
Glucose, Bld: 117 mg/dL — ABNORMAL HIGH (ref 65–99)
Potassium: 3.3 mmol/L — ABNORMAL LOW (ref 3.5–5.1)
Sodium: 139 mmol/L (ref 135–145)
Total Bilirubin: 1.2 mg/dL (ref 0.3–1.2)
Total Protein: 8 g/dL (ref 6.5–8.1)

## 2016-08-10 LAB — CBC
HEMATOCRIT: 39.9 % (ref 35.0–47.0)
HEMOGLOBIN: 13.7 g/dL (ref 12.0–16.0)
MCH: 31.5 pg (ref 26.0–34.0)
MCHC: 34.2 g/dL (ref 32.0–36.0)
MCV: 92.1 fL (ref 80.0–100.0)
Platelets: 228 10*3/uL (ref 150–440)
RBC: 4.33 MIL/uL (ref 3.80–5.20)
RDW: 13.1 % (ref 11.5–14.5)
WBC: 11.9 10*3/uL — ABNORMAL HIGH (ref 3.6–11.0)

## 2016-08-10 LAB — URINALYSIS COMPLETE WITH MICROSCOPIC (ARMC ONLY)
BILIRUBIN URINE: NEGATIVE
Glucose, UA: NEGATIVE mg/dL
Ketones, ur: NEGATIVE mg/dL
Nitrite: NEGATIVE
PH: 6 (ref 5.0–8.0)
Protein, ur: 100 mg/dL — AB
SPECIFIC GRAVITY, URINE: 1.009 (ref 1.005–1.030)

## 2016-08-10 LAB — POCT PREGNANCY, URINE: Preg Test, Ur: NEGATIVE

## 2016-08-10 LAB — LIPASE, BLOOD: Lipase: 23 U/L (ref 11–51)

## 2016-08-10 MED ORDER — FLUCONAZOLE 150 MG PO TABS: 150.0000 mg | ORAL_TABLET | Freq: Every day | ORAL | 0 refills | Status: AC

## 2016-08-10 MED ORDER — SODIUM CHLORIDE 0.9 % IV BOLUS (SEPSIS)
500.0000 mL | Freq: Once | INTRAVENOUS | Status: AC
  Administered 2016-08-10: 500 mL via INTRAVENOUS

## 2016-08-10 MED ORDER — IBUPROFEN 600 MG PO TABS: 600.0000 mg | ORAL_TABLET | Freq: Three times a day (TID) | ORAL | 0 refills | Status: DC | PRN

## 2016-08-10 MED ORDER — ONDANSETRON 4 MG PO TBDP: 4.0000 mg | ORAL_TABLET | Freq: Three times a day (TID) | ORAL | 0 refills | Status: DC | PRN

## 2016-08-10 MED ORDER — CEPHALEXIN 500 MG PO CAPS: 500.0000 mg | ORAL_CAPSULE | Freq: Two times a day (BID) | ORAL | 0 refills | Status: AC

## 2016-08-10 MED ORDER — CEFTRIAXONE SODIUM IN DEXTROSE 20 MG/ML IV SOLN
1.0000 g | Freq: Once | INTRAVENOUS | Status: AC
  Administered 2016-08-10: 1 g via INTRAVENOUS
  Filled 2016-08-10: qty 50

## 2016-08-10 NOTE — ED Triage Notes (Signed)
Pt arrives to ED with c/o "whole left side" abdominal pain with N/V/D that stated today. Pt reports her urine has had an "ammonia smell" X3-4 days. Pr also states she has cervical biopsy scheduled for this Thursday d/t an "abnormal HPV test". Pt states she had a BC implant placed 2-3 weeks ago and wonders if her s/x's could be related. Pt denies CP or SHOB.

## 2016-08-10 NOTE — ED Provider Notes (Addendum)
[facility] Emergency Department Provider Note   ____________________________________________   First MD Initiated Contact with Patient 08/10/16 0303     (approximate)  I have reviewed the triage vital signs and the nursing notes.   HISTORY  Chief Complaint Abdominal Pain; Dysuria; and Urinary Tract Infection    HPI Chelsea Parks is a 20 y.o. female with no chronic medical problems who presents for evaluation of 4 days painful urination, increased urinary frequency, urinary hesitancy, gradual onset, constant, severe, no modifying factors. She reports her urine has also had a very foul odor. Last night she also developed nonbloody nonbilious emesis. No diarrhea. No fevers or chills. She is having some left flank plain. She is approximately 2 months postpartum status post vaginal delivery which was uncomplicated. 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 He denies any history of kidney stones. She denies any chest pain or difficulty breathing. She is concerned that  her symptoms could be related to nexplanon which was implanted a few weeks ago. She is not breast-feeding.   Past Medical History:  Diagnosis Date  . H/O postpartum depression, currently pregnant   . Herpes genitalis in women     Patient Active Problem List   Diagnosis Date Noted  . Indication for care in labor or delivery 05/28/2016    Past Surgical History:  Procedure Laterality Date  . no surgical history      Prior to Admission medications   Medication Sig Start Date End Date Taking? Authorizing Provider  cephALEXin (KEFLEX) 500 MG capsule Take 1 capsule (500 mg total) by mouth 2 (two) times daily. 08/10/16 08/17/16  Gayla Doss, MD  fluconazole (DIFLUCAN) 150 MG tablet Take 1 tablet (150 mg total) by mouth daily. 08/10/16 08/11/16  Gayla Doss, MD  ibuprofen (ADVIL,MOTRIN) 600 MG tablet Take 1 tablet  (600 mg total) by mouth every 8 (eight) hours as needed for moderate pain. 08/10/16   Gayla Doss, MD  ondansetron (ZOFRAN ODT) 4 MG disintegrating tablet Take 1 tablet (4 mg total) by mouth every 8 (eight) hours as needed for nausea or vomiting. 08/10/16   Gayla Doss, MD  Prenatal Vit-Fe Fumarate-FA (MULTIVITAMIN-PRENATAL) 27-0.8 MG TABS tablet Take 1 tablet by mouth daily at 12 noon.    Historical Provider, MD    Allergies Review of patient's allergies indicates no known allergies.  Family History  Problem Relation Age of Onset  . Lupus Maternal Aunt   . Pancreatic cancer Maternal Uncle   . Stroke Maternal Grandfather   . Kidney cancer Neg Hx     Social History Social History  Substance Use Topics  . Smoking status: Never Smoker  . Smokeless tobacco: Never Used  . Alcohol use No    Review of Systems Constitutional: No fever/chills Eyes: No visual changes. ENT: No sore throat. Cardiovascular: Denies chest pain. Respiratory: Denies shortness of breath. Gastrointestinal: No abdominal pain.  + nausea, + vomiting.  No diarrhea.  No constipation. Genitourinary: Positive for dysuria. Musculoskeletal: Positive for left flank pain. Skin: Negative for rash. Neurological: Negative for headaches, focal weakness or numbness.  10-point ROS otherwise negative.  ____________________________________________   PHYSICAL EXAM:  Vitals:   08/10/16 0400 08/10/16 0415 08/10/16 0430 08/10/16 0503  BP: 109/69 103/68 105/63 110/67  Pulse: 79 77 90 84  Resp:    16  Temp:      TempSrc:  SpO2: 100% 99% 98% 99%  Weight:      Height:        VITAL SIGNS: ED Triage Vitals  Enc Vitals Group     BP 08/10/16 0048 118/78     Pulse Rate 08/10/16 0048 (!) 105     Resp 08/10/16 0048 16     Temp 08/10/16 0048 98.2 F (36.8 C)     Temp Source 08/10/16 0048 Oral     SpO2 08/10/16 0048 100 %     Weight 08/10/16 0050 135 lb (61.2 kg)     Height 08/10/16 0050 5\' 2"  (1.575 m)     Head  Circumference --      Peak Flow --      Pain Score 08/10/16 0050 8     Pain Loc --      Pain Edu? --      Excl. in GC? --     Constitutional: Alert and oriented. Well appearing and in no acute distress. Eyes: Conjunctivae are normal. PERRL. EOMI. Head: Atraumatic. Nose: No congestion/rhinnorhea. Mouth/Throat: Mucous membranes are moist.  Oropharynx non-erythematous. Neck: No stridor. Up without meningismus. Cardiovascular: Normal rate, regular rhythm. Grossly normal heart sounds.  Good peripheral circulation. Respiratory: Normal respiratory effort.  No retractions. Lungs CTAB. Gastrointestinal: Soft and nontender. No distention. Normal bowel sounds. No CVA tenderness. Genitourinary: deferred Musculoskeletal: No lower extremity tenderness nor edema.  No joint effusions. Neurologic:  Normal speech and language. No gross focal neurologic deficits are appreciated. No gait instability. Skin:  Skin is warm, dry and intact. No rash noted. Psychiatric: Mood and affect are normal. Speech and behavior are normal.  ____________________________________________   LABS (all labs ordered are listed, but only abnormal results are displayed)  Labs Reviewed  COMPREHENSIVE METABOLIC PANEL - Abnormal; Notable for the following:       Result Value   Potassium 3.3 (*)    Glucose, Bld 117 (*)    All other components within normal limits  CBC - Abnormal; Notable for the following:    WBC 11.9 (*)    All other components within normal limits  URINALYSIS COMPLETEWITH MICROSCOPIC ([facility] ONLY) - Abnormal; Notable for the following:    Color, Urine YELLOW (*)    APPearance CLOUDY (*)    Hgb urine dipstick 2+ (*)    Protein, ur 100 (*)    Leukocytes, UA 3+ (*)    Bacteria, UA RARE (*)    Squamous Epithelial / LPF 0-5 (*)    All other components within normal limits  URINE CULTURE  CULTURE, BLOOD (ROUTINE X 2)  CULTURE, BLOOD (ROUTINE X 2)  LIPASE, BLOOD  POC URINE PREG, ED  POCT PREGNANCY, URINE    ____________________________________________  EKG  none ____________________________________________  RADIOLOGY  none ____________________________________________   PROCEDURES  Procedure(s) performed: None  Procedures  Critical Care performed: No  ____________________________________________   INITIAL IMPRESSION / ASSESSMENT AND PLAN / ED COURSE  Pertinent labs & imaging results that were available during my care of the patient were reviewed by me and considered in my medical decision making (see chart for details).  Chelsea Parks is a 20 y.o. female with no chronic medical problems who presents for evaluation of 4 days painful urination, increased urinary frequency, urinary hesitancy and foul-smelling urine as well as new left flank pain and nausea vomiting. On arrival to the emergency department she was mildly tachycardic however that has resolved at the time of my assessment. The remainder of her vital signs are  stable and she is afebrile. She has a benign abdominal exam, no CVA tenderness. CBC shows a very mild leukocytosis, white blood cell count 11.9. On remarkable CMP. Urinalysis is concerning for urinary tract infection and I suspect early pyelonephritis. Negative pregnancy test. We'll give fluids, ceftriaxone and reassess.  ----------------------------------------- 4:59 AM on 08/10/2016 ----------------------------------------- Patient continues to appear well at this time. She is tolerating by mouth intake without vomiting. We'll discharge with Keflex, Zofran. We discussed meticulous return precautions and need for close PCP follow-up and she is comfortable with the discharge plan. DC home. She is also requesting Diflucan for prophylaxis against yeast vaginitis.  Clinical Course     ____________________________________________   FINAL CLINICAL IMPRESSION(S) / ED DIAGNOSES  Final diagnoses:  Pyelonephritis      NEW MEDICATIONS STARTED DURING THIS  VISIT:  New Prescriptions   CEPHALEXIN (KEFLEX) 500 MG CAPSULE    Take 1 capsule (500 mg total) by mouth 2 (two) times daily.   FLUCONAZOLE (DIFLUCAN) 150 MG TABLET    Take 1 tablet (150 mg total) by mouth daily.   IBUPROFEN (ADVIL,MOTRIN) 600 MG TABLET    Take 1 tablet (600 mg total) by mouth every 8 (eight) hours as needed for moderate pain.   ONDANSETRON (ZOFRAN ODT) 4 MG DISINTEGRATING TABLET    Take 1 tablet (4 mg total) by mouth every 8 (eight) hours as needed for nausea or vomiting.     Note:  This document was prepared using Dragon voice recognition software and may include unintentional dictation errors.    Gayla DossEryka A Laury Huizar, MD 08/10/16 [id number]    Gayla DossEryka A Danetta Prom, MD 08/10/16 [id number]

## 2016-08-12 LAB — URINE CULTURE

## 2016-09-06 ENCOUNTER — Encounter: Payer: Self-pay | Admitting: *Deleted

## 2016-09-06 ENCOUNTER — Emergency Department
Admission: EM | Admit: 2016-09-06 | Discharge: 2016-09-06 | Disposition: A | Discharge status: Home / Self Care | Payer: Medicaid Other | Attending: Emergency Medicine | Admitting: Emergency Medicine

## 2016-09-06 DIAGNOSIS — N3001 Acute cystitis with hematuria: Secondary | ICD-10-CM | POA: Diagnosis not present

## 2016-09-06 DIAGNOSIS — Z79899 Other long term (current) drug therapy: Secondary | ICD-10-CM | POA: Diagnosis not present

## 2016-09-06 DIAGNOSIS — R300 Dysuria: Secondary | ICD-10-CM | POA: Diagnosis present

## 2016-09-06 LAB — URINALYSIS COMPLETE WITH MICROSCOPIC (ARMC ONLY)
BILIRUBIN URINE: NEGATIVE
Glucose, UA: NEGATIVE mg/dL
KETONES UR: NEGATIVE mg/dL
NITRITE: NEGATIVE
PH: 6 (ref 5.0–8.0)
Protein, ur: NEGATIVE mg/dL
SPECIFIC GRAVITY, URINE: 1.023 (ref 1.005–1.030)

## 2016-09-06 MED ORDER — CIPROFLOXACIN HCL 500 MG PO TABS
ORAL_TABLET | ORAL | Status: AC
  Administered 2016-09-06: 500 mg via ORAL
  Filled 2016-09-06: qty 1

## 2016-09-06 MED ORDER — CIPROFLOXACIN HCL 500 MG PO TABS: 500.0000 mg | ORAL_TABLET | Freq: Two times a day (BID) | ORAL | 0 refills | Status: AC

## 2016-09-06 MED ORDER — CIPROFLOXACIN HCL 500 MG PO TABS
500.0000 mg | ORAL_TABLET | Freq: Once | ORAL | Status: AC
  Administered 2016-09-06: 500 mg via ORAL

## 2016-09-06 NOTE — ED Triage Notes (Signed)
Patient c/o burning sensation and has a strong ammonia smell to her urine.

## 2016-09-06 NOTE — ED Provider Notes (Signed)
[facility]-EMERGENCY DEPARTMENT Provider Note   CSN: [id number] Arrival date & time: 09/06/16  1959     History   Chief Complaint Chief Complaint  Patient presents with  . Urinary Tract Infection    HPI Chelsea Parks is a 20 y.o. female presents to emergency department for evaluation of possible urinary tract infection. Patient had pyelonephritis one month ago. She responded well to treatment and had been doing well up until today when she developed burning sensation with urination. She also noticed an ammonia smell with her urine. She denies any fevers, back pain, abdominal pain. Pain with urination is mild. She has slight increase in frequency.  Marland Kitchen. HPI  Past Medical History:  Diagnosis Date  . H/O postpartum depression, currently pregnant   . Herpes genitalis in women     Patient Active Problem List   Diagnosis Date Noted  . Indication for care in labor or delivery 05/28/2016    Past Surgical History:  Procedure Laterality Date  . CERVICAL BIOPSY    . no surgical history      OB History    Gravida Para Term Preterm AB Living   2 1 1     2    SAB TAB Ectopic Multiple Live Births         0         Home Medications    Prior to Admission medications   Medication Sig Start Date End Date Taking? Authorizing Provider  ciprofloxacin (CIPRO) 500 MG tablet Take 1 tablet (500 mg total) by mouth 2 (two) times daily. X 5 days 09/06/16 09/16/16  Evon Slackhomas C Joyclyn Plazola, PA-C  ibuprofen (ADVIL,MOTRIN) 600 MG tablet Take 1 tablet (600 mg total) by mouth every 8 (eight) hours as needed for moderate pain. 08/10/16   Gayla DossEryka A Gayle, MD  ondansetron (ZOFRAN ODT) 4 MG disintegrating tablet Take 1 tablet (4 mg total) by mouth every 8 (eight) hours as needed for nausea or vomiting. 08/10/16   Gayla DossEryka A Gayle, MD  Prenatal Vit-Fe Fumarate-FA (MULTIVITAMIN-PRENATAL) 27-0.8 MG TABS tablet Take 1 tablet by mouth daily at 12 noon.    Historical Provider, MD    Family History Family History  Problem  Relation Age of Onset  . Lupus Maternal Aunt   . Pancreatic cancer Maternal Uncle   . Stroke Maternal Grandfather   . Kidney cancer Neg Hx     Social History Social History  Substance Use Topics  . Smoking status: Never Smoker  . Smokeless tobacco: Never Used  . Alcohol use 0.0 oz/week     Comment: occasionally     Allergies   Patient has no known allergies.   Review of Systems Review of Systems  Constitutional: Negative for activity change, chills, fatigue and fever.  HENT: Negative for congestion, sinus pressure and sore throat.   Eyes: Negative for visual disturbance.  Respiratory: Negative for cough, chest tightness and shortness of breath.   Cardiovascular: Negative for chest pain and leg swelling.  Gastrointestinal: Negative for abdominal pain, diarrhea, nausea and vomiting.  Genitourinary: Positive for dysuria and frequency. Negative for flank pain.  Musculoskeletal: Negative for arthralgias and gait problem.  Skin: Negative for rash.  Neurological: Negative for weakness, numbness and headaches.  Hematological: Negative for adenopathy.  Psychiatric/Behavioral: Negative for agitation, behavioral problems and confusion.     Physical Exam Updated Vital Signs BP 107/77 (BP Location: Left Arm)   Pulse 78   Temp 98.1 F (36.7 C) (Oral)   Resp 18   Ht 5'  2" (1.575 m)   Wt 61.2 kg   SpO2 99%   BMI 24.69 kg/m   Physical Exam  Constitutional: She is oriented to person, place, and time. She appears well-developed and well-nourished. No distress.  HENT:  Head: Normocephalic and atraumatic.  Mouth/Throat: Oropharynx is clear and moist.  Eyes: EOM are normal. Pupils are equal, round, and reactive to light. Right eye exhibits no discharge. Left eye exhibits no discharge.  Neck: Normal range of motion. Neck supple.  Cardiovascular: Normal rate, regular rhythm and intact distal pulses.   Pulmonary/Chest: Effort normal and breath sounds normal. No respiratory distress.  She exhibits no tenderness.  Abdominal: Soft. She exhibits no distension and no mass. There is no tenderness. There is no guarding.  Musculoskeletal: Normal range of motion. She exhibits no edema.  Neurological: She is alert and oriented to person, place, and time. She has normal reflexes.  Skin: Skin is warm and dry.  Psychiatric: She has a normal mood and affect. Her behavior is normal. Thought content normal.     ED Treatments / Results  Labs (all labs ordered are listed, but only abnormal results are displayed) Labs Reviewed  URINALYSIS COMPLETEWITH MICROSCOPIC ([facility] ONLY) - Abnormal; Notable for the following:       Result Value   Color, Urine YELLOW (*)    APPearance HAZY (*)    Hgb urine dipstick 2+ (*)    Leukocytes, UA 2+ (*)    Bacteria, UA RARE (*)    Squamous Epithelial / LPF 0-5 (*)    All other components within normal limits    EKG  EKG Interpretation None       Radiology No results found.  Procedures Procedures (including critical care time)  Medications Ordered in ED Medications  ciprofloxacin (CIPRO) tablet 500 mg (not administered)     Initial Impression / Assessment and Plan / ED Course  I have reviewed the triage vital signs and the nursing notes.  Pertinent labs & imaging results that were available during my care of the patient were reviewed by me and considered in my medical decision making (see chart for details).  Clinical Course     20 year old female with urinary tract infection. No signs of pyelonephritis. Vital signs are normal. Urine cultures from one month ago show sensitivity to ciprofloxacin. We'll treat for 5 days. She will increase fluids. She is educated on signs and symptoms to return to the emergency department for.  Final Clinical Impressions(s) / ED Diagnoses   Final diagnoses:  Acute cystitis with hematuria    New Prescriptions New Prescriptions   CIPROFLOXACIN (CIPRO) 500 MG TABLET    Take 1 tablet (500 mg total)  by mouth 2 (two) times daily. X 5 days     Evon Slackhomas C Fin Hupp, PA-C 09/06/16 2106    Minna AntisKevin Paduchowski, MD 09/06/16 2255

## 2016-09-06 NOTE — Discharge Instructions (Signed)
Please take medications as prescribed. Drink lots of fluids. Return to the ER for any worsening symptoms urgent changes in her health such as fevers, back pain nausea or vomiting.

## 2017-01-02 ENCOUNTER — Ambulatory Visit (INDEPENDENT_AMBULATORY_CARE_PROVIDER_SITE_OTHER): Payer: Medicaid Other | Admitting: Obstetrics and Gynecology

## 2017-01-02 ENCOUNTER — Encounter: Payer: Self-pay | Admitting: Obstetrics and Gynecology

## 2017-01-02 VITALS — BP 119/76 | HR 109 | Ht 62.0 in | Wt 136.5 lb

## 2017-01-02 DIAGNOSIS — Z3009 Encounter for other general counseling and advice on contraception: Secondary | ICD-10-CM

## 2017-01-02 LAB — POCT URINE PREGNANCY: Preg Test, Ur: NEGATIVE

## 2017-01-02 MED ORDER — MEDROXYPROGESTERONE ACETATE 150 MG/ML IM SUSP: 150.0000 mg | INTRAMUSCULAR | 3 refills | Status: DC

## 2017-01-02 NOTE — Progress Notes (Signed)
GYNECOLOGY CLINIC PROGRESS NOTE  Subjective:    Chelsea Parks is a 21 y.o. [id number] female who presents for contraception counseling. The patient has no complaints today. The patient is sexually active. Pertinent past medical history: none.  Patient states that she was on the pill but her periods were getting worse.  She has tried Depo Provera and NuvaRing in the past which has worked well for her. Also inquires into LARC contraception.   Menstrual History: OB History    Gravida Para Term Preterm AB Living   2 2 2     2    SAB TAB Ectopic Multiple Live Births         0 2      Menarche age: 24 Patient's last menstrual period was 12/05/2016. Period Cycle (Days): 28 Period Duration (Days): 4-5 Period Pattern: Regular Menstrual Flow: Moderate Dysmenorrhea: (!) Severe Dysmenorrhea Symptoms: Cramping, Diarrhea  Past Medical History:  Diagnosis Date  . H/O postpartum depression, currently pregnant   . Herpes genitalis in women     Family History  Problem Relation Age of Onset  . Lupus Maternal Aunt   . Pancreatic cancer Maternal Uncle   . Stroke Maternal Grandfather   . Kidney cancer Neg Hx     Past Surgical History:  Procedure Laterality Date  . CERVICAL BIOPSY    . no surgical history      Social History   Social History  . Marital status: Married    Spouse name: N/A  . Number of children: N/A  . Years of education: N/A   Occupational History  . Not on file.   Social History Main Topics  . Smoking status: Never Smoker  . Smokeless tobacco: Never Used  . Alcohol use 0.0 oz/week     Comment: occasionally  . Drug use: No  . Sexual activity: Yes    Birth control/ protection: Pill   Other Topics Concern  . Not on file   Social History Narrative  . No narrative on file    No current outpatient prescriptions on file prior to visit.   No current facility-administered medications on file prior to visit.     No Known Allergies   Review of Systems A  comprehensive review of systems was negative.   Objective:    BP 119/76 (BP Location: Left Arm, Patient Position: Sitting, Cuff Size: Normal)   Pulse (!) 109   Ht 5\' 2"  (1.575 m)   Wt 136 lb 8 oz (61.9 kg)   LMP 12/05/2016   Breastfeeding? No   BMI 24.97 kg/m  General appearance: alert and no distress Neck: no adenopathy, no carotid bruit, no JVD, supple, symmetrical, trachea midline and thyroid not enlarged, symmetric, no tenderness/mass/nodules Lungs: clear to auscultation bilaterally Heart: regular rate and rhythm, S1, S2 normal, no murmur, click, rub or gallop Abdomen: soft, non-tender; bowel sounds normal; no masses,  no organomegaly Pelvic: deferred Extremities: extremities normal, atraumatic, no cyanosis or edema Neurologic: Grossly normal   Assessment:   Contraception counseling  Plan:   - Reviewed all forms of birth control options available including abstinence; over the counter/barrier methods; hormonal contraceptive medication including pill, patch, ring, injection,contraceptive implant; hormonal and nonhormonal IUDs; permanent sterilization options including vasectomy and the various tubal sterilization modalities. Risks and benefits reviewed.  Questions were answered.  Information was given to patient to review.  Patient currently considering IUD (non-hormonal vs low-dose hormonal), but also considering resuming Depo Provera.  As patient is unsure at this point and  desires time to think it over, offered to prescribe an injection of Depo Provera which will give her up 3 months to make a decision as to whether she will continue Depo Procera or switch to an IUD.    - To f/u in the next several days for Depo Provera injection.    A total of 20 minutes were spent face-to-face with the patient during this encounter and over half of that time dealt with counseling and coordination of care.   Hildred LaserAnika Undrea Archbold, MD [facility]

## 2017-01-03 ENCOUNTER — Telehealth: Payer: Self-pay

## 2017-01-03 ENCOUNTER — Ambulatory Visit (INDEPENDENT_AMBULATORY_CARE_PROVIDER_SITE_OTHER): Payer: Medicaid Other | Admitting: Obstetrics and Gynecology

## 2017-01-03 VITALS — BP 107/75 | HR 90 | Wt 139.2 lb

## 2017-01-03 DIAGNOSIS — Z30013 Encounter for initial prescription of injectable contraceptive: Secondary | ICD-10-CM | POA: Diagnosis not present

## 2017-01-03 MED ORDER — MEDROXYPROGESTERONE ACETATE 150 MG/ML IM SUSP
150.0000 mg | Freq: Once | INTRAMUSCULAR | Status: AC
  Administered 2017-01-03: 150 mg via INTRAMUSCULAR

## 2017-01-03 NOTE — Progress Notes (Signed)
Patient ID: Chelsea Parks, female   DOB: 20-Feb-1996, 21 y.o.   MRN: [id number] Pt presents for first depo-provera injection for contraception. Was seen yesterday in office and UPT-negative. Did not repeat today.

## 2017-01-03 NOTE — Telephone Encounter (Signed)
error 

## 2017-03-28 ENCOUNTER — Ambulatory Visit: Payer: Medicaid Other

## 2017-04-07 ENCOUNTER — Encounter: Payer: Self-pay | Admitting: Certified Nurse Midwife

## 2017-04-07 ENCOUNTER — Ambulatory Visit (INDEPENDENT_AMBULATORY_CARE_PROVIDER_SITE_OTHER): Payer: Medicaid Other | Admitting: Certified Nurse Midwife

## 2017-04-07 VITALS — BP 99/69 | HR 94 | Wt 137.5 lb

## 2017-04-07 DIAGNOSIS — Z3042 Encounter for surveillance of injectable contraceptive: Secondary | ICD-10-CM | POA: Diagnosis not present

## 2017-04-07 LAB — POCT URINE PREGNANCY: PREG TEST UR: NEGATIVE

## 2017-04-07 MED ORDER — MEDROXYPROGESTERONE ACETATE 150 MG/ML IM SUSP
150.0000 mg | Freq: Once | INTRAMUSCULAR | Status: AC
  Administered 2017-04-07: 150 mg via INTRAMUSCULAR

## 2017-04-07 NOTE — Patient Instructions (Addendum)
Medroxyprogesterone injection [Contraceptive] What is this medicine? MEDROXYPROGESTERONE (me DROX ee proe JES te rone) contraceptive injections prevent pregnancy. They provide effective birth control for 3 months. Depo-subQ Provera 104 is also used for treating pain related to endometriosis. This medicine may be used for other purposes; ask your health care provider or pharmacist if you have questions. COMMON BRAND NAME(S): Depo-Provera, Depo-subQ Provera 104 What should I tell my health care provider before I take this medicine? They need to know if you have any of these conditions: -frequently drink alcohol -asthma -blood vessel disease or a history of a blood clot in the lungs or legs -bone disease such as osteoporosis -breast cancer -diabetes -eating disorder (anorexia nervosa or bulimia) -high blood pressure -HIV infection or AIDS -kidney disease -liver disease -mental depression -migraine -seizures (convulsions) -stroke -tobacco smoker -vaginal bleeding -an unusual or allergic reaction to medroxyprogesterone, other hormones, medicines, foods, dyes, or preservatives -pregnant or trying to get pregnant -breast-feeding How should I use this medicine? Depo-Provera Contraceptive injection is given into a muscle. Depo-subQ Provera 104 injection is given under the skin. These injections are given by a health care professional. You must not be pregnant before getting an injection. The injection is usually given during the first 5 days after the start of a menstrual period or 6 weeks after delivery of a baby. Talk to your pediatrician regarding the use of this medicine in children. Special care may be needed. These injections have been used in female children who have started having menstrual periods. Overdosage: If you think you have taken too much of this medicine contact a poison control center or emergency room at once. NOTE: This medicine is only for you. Do not share this medicine  with others. What if I miss a dose? Try not to miss a dose. You must get an injection once every 3 months to maintain birth control. If you cannot keep an appointment, call and reschedule it. If you wait longer than 13 weeks between Depo-Provera contraceptive injections or longer than 14 weeks between Depo-subQ Provera 104 injections, you could get pregnant. Use another method for birth control if you miss your appointment. You may also need a pregnancy test before receiving another injection. What may interact with this medicine? Do not take this medicine with any of the following medications: -bosentan This medicine may also interact with the following medications: -aminoglutethimide -antibiotics or medicines for infections, especially rifampin, rifabutin, rifapentine, and griseofulvin -aprepitant -barbiturate medicines such as phenobarbital or primidone -bexarotene -carbamazepine -medicines for seizures like ethotoin, felbamate, oxcarbazepine, phenytoin, topiramate -modafinil -St. John's wort This list may not describe all possible interactions. Give your health care provider a list of all the medicines, herbs, non-prescription drugs, or dietary supplements you use. Also tell them if you smoke, drink alcohol, or use illegal drugs. Some items may interact with your medicine. What should I watch for while using this medicine? This drug does not protect you against HIV infection (AIDS) or other sexually transmitted diseases. Use of this product may cause you to lose calcium from your bones. Loss of calcium may cause weak bones (osteoporosis). Only use this product for more than 2 years if other forms of birth control are not right for you. The longer you use this product for birth control the more likely you will be at risk for weak bones. Ask your health care professional how you can keep strong bones. You may have a change in bleeding pattern or irregular periods. Many females stop having    periods while taking this drug. If you have received your injections on time, your chance of being pregnant is very low. If you think you may be pregnant, see your health care professional as soon as possible. Tell your health care professional if you want to get pregnant within the next year. The effect of this medicine may last a long time after you get your last injection. What side effects may I notice from receiving this medicine? Side effects that you should report to your doctor or health care professional as soon as possible: -allergic reactions like skin rash, itching or hives, swelling of the face, lips, or tongue -breast tenderness or discharge -breathing problems -changes in vision -depression -feeling faint or lightheaded, falls -fever -pain in the abdomen, chest, groin, or leg -problems with balance, talking, walking -unusually weak or tired -yellowing of the eyes or skin Side effects that usually do not require medical attention (report to your doctor or health care professional if they continue or are bothersome): -acne -fluid retention and swelling -headache -irregular periods, spotting, or absent periods -temporary pain, itching, or skin reaction at site where injected -weight gain This list may not describe all possible side effects. Call your doctor for medical advice about side effects. You may report side effects to FDA at 1-800-FDA-1088. Where should I keep my medicine? This does not apply. The injection will be given to you by a health care professional. NOTE: This sheet is a summary. It may not cover all possible information. If you have questions about this medicine, talk to your doctor, pharmacist, or health care provider.  2018 Elsevier/Gold Standard (2008-11-04 18:37:56) Hormonal Contraception Information Hormonal contraception is a type of birth control that uses hormones to prevent pregnancy. It usually involves a combination of the hormones estrogen and  progesterone or only the hormone progesterone. Hormonal contraception works in these ways:  It thickens the mucus in the cervix, making it harder for sperm to enter the uterus.  It changes the lining of the uterus, making it harder for an egg to implant.  It may stop the ovaries from releasing eggs (ovulation). Some women who take hormonal contraceptives that contain only progesterone may continue to ovulate.  Hormonal contraception cannot prevent sexually transmitted infections (STIs). Pregnancy may still occur. Estrogen and progesterone contraceptives Contraceptives that use a combination of estrogen and progesterone are available in these forms:  Pill. Pills come in different combinations of hormones. They must be taken at the same time each day. Pills can affect your period, causing you to get your period once every three months or not at all.  Patch. The patch must be worn on the lower abdomen for three weeks and then removed on the fourth.  Vaginal ring. The ring is placed in the vagina and left there for three weeks. It is then removed for one week.  Progesterone contraceptives Contraceptives that use progesterone only are available in these forms:  Pill. Pills should be taken every day of the cycle.  Intrauterine device (IUD). This device is inserted into the uterus and removed or replaced every five years or sooner.  Implant. Plastic rods are placed under the skin of the upper arm. They are removed or replaced every three years or sooner.  Injection. The injection is given once every 90 days.  What are the side effects? The side effects of estrogen and progesterone contraceptives include:  Nausea.  Headaches.  Breast tenderness.  Bleeding or spotting between menstrual cycles.  High blood pressure (rare).  Strokes, heart attacks, or blood clots (rare)  Side effects of progesterone-only contraceptives include:  Nausea.  Headaches.  Breast  tenderness.  Unpredictable menstrual bleeding.  High blood pressure (rare).  Talk to your health care provider about what side effects may affect you. Where to find more information:  Ask your health care provider for more information and resources about hormonal contraception.  U.S. Department of Health and CytogeneticistHuman Services Office on Women's Health: http://hoffman.com/www.womenshealth.gov Questions to ask:  What type of hormonal contraception is right for me?  How long should I plan to use hormonal contraception?  What are the side effects of the hormonal contraception method I choose?  How can I prevent STIs while using hormonal contraception? Contact a health care provider if:  You start taking hormonal contraceptives and you develop persistent or severe side effects. Summary  Estrogen and progesterone are hormones used in many forms of birth control.  Talk to your health care provider about what side effects may affect you.  Hormonal contraception cannot prevent sexually transmitted infections (STIs).  Ask your health care provider for more information and resources about hormonal contraception. This information is not intended to replace advice given to you by your health care provider. Make sure you discuss any questions you have with your health care provider. Document Released: 11/03/2007 Document Revised: 09/13/2016 Document Reviewed: 09/13/2016 Elsevier Interactive Patient Education  [employer]2018 Elsevier Inc.

## 2017-04-07 NOTE — Progress Notes (Signed)
Pt is here for depo provera inj, she is doing well, denies any complaints

## 2017-04-07 NOTE — Progress Notes (Signed)
I have reviewed the record and concur with patient management and plan.    Jenkins Michelle Lawhorn, CNM [facility], [facility] 

## 2017-04-17 ENCOUNTER — Ambulatory Visit (INDEPENDENT_AMBULATORY_CARE_PROVIDER_SITE_OTHER): Payer: Medicaid Other | Admitting: Obstetrics and Gynecology

## 2017-04-17 ENCOUNTER — Encounter: Payer: Self-pay | Admitting: Obstetrics and Gynecology

## 2017-04-17 VITALS — BP 103/72 | HR 82 | Temp 98.2°F | Ht 62.0 in | Wt 137.3 lb

## 2017-04-17 DIAGNOSIS — N3000 Acute cystitis without hematuria: Secondary | ICD-10-CM

## 2017-04-17 LAB — POCT URINALYSIS DIPSTICK
GLUCOSE UA: NEGATIVE
Ketones, UA: NEGATIVE
Nitrite, UA: POSITIVE
Protein, UA: NEGATIVE
Spec Grav, UA: 1.01 (ref 1.010–1.025)
UROBILINOGEN UA: 0.2 U/dL
pH, UA: 6.5 (ref 5.0–8.0)

## 2017-04-17 MED ORDER — FLUCONAZOLE 150 MG PO TABS: 150.0000 mg | ORAL_TABLET | Freq: Once | ORAL | 2 refills | Status: AC

## 2017-04-17 MED ORDER — CIPROFLOXACIN HCL 500 MG PO TABS: 500.0000 mg | ORAL_TABLET | Freq: Two times a day (BID) | ORAL | 0 refills | Status: DC

## 2017-04-17 NOTE — Progress Notes (Signed)
    Subjective:    Chelsea Parks is a 21 y.o. female who complains of urinary frequency, abnormal smelling urine and burning with urination. She has had symptoms for 4 days. Patient denies back pain, fever and vaginal discharge. Patient does not have a history of recurrent UTI. Patient does not have a history of pyelonephritis.  Has been trying to self treat with Azo and increased hydration.  The following portions of the patient's history were reviewed and updated as appropriate: allergies, current medications, past family history, past medical history, past social history, past surgical history and problem list.  Review of Systems Pertinent items noted in HPI and remainder of comprehensive ROS otherwise negative.    Objective:    BP 103/72   Pulse 82   Ht 5\' 2"  (1.575 m)   Wt 137 lb 4.8 oz (62.3 kg)   LMP  (LMP Unknown)   Breastfeeding? No   BMI 25.11 kg/m  General appearance: alert and no distress Back: symmetric, no curvature. ROM normal. No CVA tenderness. Abdomen: soft, non-tender; bowel sounds normal; no masses,  no organomegaly Pelvic: deferred   Laboratory:  Results for orders placed or performed in visit on 04/17/17  POCT urinalysis dipstick  Result Value Ref Range   Color, UA dark yellow    Clarity, UA clear    Glucose, UA neg    Bilirubin, UA 1+    Ketones, UA neg    Spec Grav, UA 1.010 1.010 - 1.025   Blood, UA trace non hem    pH, UA 6.5 5.0 - 8.0   Protein, UA neg    Urobilinogen, UA 0.2 0.2 or 1.0 E.U./dL   Nitrite, UA pos    Leukocytes, UA Moderate (2+) (A) Negative      Assessment:    Acute cystitis     Plan:    Medications: ciprofloxacin. Maintain adequate hydration. Follow up if symptoms not improving, and as needed.    Hildred Laserherry, Chelsea Gimbel, MD [facility]

## 2017-04-17 NOTE — Patient Instructions (Addendum)

## 2017-04-20 LAB — URINE CULTURE

## 2017-04-22 ENCOUNTER — Telehealth: Payer: Self-pay

## 2017-04-22 NOTE — Telephone Encounter (Signed)
-----   Message from Hildred LaserAnika Cherry, MD sent at 04/22/2017  8:22 AM EDT ----- Please inform patient that urine culture was positive for E.Coli.  The antibiotics previously prescribed should cover the infection.

## 2017-04-22 NOTE — Telephone Encounter (Signed)
Called pt LM for her informing her of information below.  

## 2017-07-04 ENCOUNTER — Ambulatory Visit (INDEPENDENT_AMBULATORY_CARE_PROVIDER_SITE_OTHER): Payer: Medicaid Other | Admitting: Obstetrics and Gynecology

## 2017-07-04 VITALS — BP 105/68 | HR 79 | Wt 135.8 lb

## 2017-07-04 DIAGNOSIS — Z3042 Encounter for surveillance of injectable contraceptive: Secondary | ICD-10-CM

## 2017-07-04 MED ORDER — MEDROXYPROGESTERONE ACETATE 150 MG/ML IM SUSP
150.0000 mg | Freq: Once | INTRAMUSCULAR | Status: AC
  Administered 2017-07-04: 150 mg via INTRAMUSCULAR

## 2017-07-04 NOTE — Progress Notes (Signed)
Pt is here for depo provera, she is doing well

## 2017-07-20 DIAGNOSIS — O99345 Other mental disorders complicating the puerperium: Secondary | ICD-10-CM | POA: Insufficient documentation

## 2017-07-20 DIAGNOSIS — F53 Postpartum depression: Secondary | ICD-10-CM | POA: Insufficient documentation

## 2017-08-02 ENCOUNTER — Emergency Department
Admission: EM | Admit: 2017-08-02 | Discharge: 2017-08-02 | Disposition: A | Discharge status: Home / Self Care | Payer: Self-pay | Attending: Emergency Medicine | Admitting: Emergency Medicine

## 2017-08-02 DIAGNOSIS — Z793 Long term (current) use of hormonal contraceptives: Secondary | ICD-10-CM | POA: Insufficient documentation

## 2017-08-02 DIAGNOSIS — N3090 Cystitis, unspecified without hematuria: Secondary | ICD-10-CM | POA: Insufficient documentation

## 2017-08-02 DIAGNOSIS — N3000 Acute cystitis without hematuria: Secondary | ICD-10-CM

## 2017-08-02 DIAGNOSIS — R102 Pelvic and perineal pain: Secondary | ICD-10-CM | POA: Insufficient documentation

## 2017-08-02 DIAGNOSIS — R350 Frequency of micturition: Secondary | ICD-10-CM | POA: Insufficient documentation

## 2017-08-02 LAB — URINALYSIS, COMPLETE (UACMP) WITH MICROSCOPIC: Specific Gravity, Urine: 1.03 (ref 1.005–1.030)

## 2017-08-02 LAB — WET PREP, GENITAL
Clue Cells Wet Prep HPF POC: NONE SEEN
Sperm: NONE SEEN
Trich, Wet Prep: NONE SEEN
Yeast Wet Prep HPF POC: NONE SEEN

## 2017-08-02 LAB — POCT PREGNANCY, URINE: PREG TEST UR: NEGATIVE

## 2017-08-02 MED ORDER — CEFTRIAXONE SODIUM 1 G IJ SOLR
1.0000 g | Freq: Once | INTRAMUSCULAR | Status: AC
  Administered 2017-08-02: 1 g via INTRAMUSCULAR
  Filled 2017-08-02: qty 10

## 2017-08-02 MED ORDER — CEPHALEXIN 500 MG PO CAPS: 500.0000 mg | ORAL_CAPSULE | Freq: Four times a day (QID) | ORAL | 0 refills | Status: AC

## 2017-08-02 NOTE — ED Triage Notes (Signed)
Pt states has been having uti symptoms for over 2 weeks. Pt states she was inpatient at [facility] with similar symptoms receiving antibiotics. Pt states symptoms persist with antibiotics given, pt denies vomiting, fever, back pain, states has pelvic pain.

## 2017-08-02 NOTE — ED Provider Notes (Signed)
[facility] Emergency Department Provider Note  ____________________________________________  Time seen: Approximately 10:21 PM  I have reviewed the triage vital signs and the nursing notes.   HISTORY  Chief Complaint Urinary Tract Infection    HPI Chelsea Parks is a 21 y.o. female presents to the emergency department with persistent dysuria for the past 2 weeks. Patient states that she was discharged from [facility] inpatient psych for postpartum depression with suicidal and homicidal ideation on 07/31/2017. Patient states that she was treated with Macrobid twice daily for 7 days. Patient states that her symptoms initially improved but returned. Patient is having suprapubic pain, dysuria and increased urinary frequency but no hematuria. Patient reports that she has taken Azo today in order to relieve her symptoms. She denies changes in vaginal discharge or concern for sexual transmitted diseases. No flank pain, nausea, vomiting or fever. She has a history of pyelonephritis or nephrolithiasis.   Past Medical History:  Diagnosis Date  . H/O postpartum depression, currently pregnant   . Herpes genitalis in women     Patient Active Problem List   Diagnosis Date Noted  . Indication for care in labor or delivery 05/28/2016    Past Surgical History:  Procedure Laterality Date  . CERVICAL BIOPSY    . no surgical history      Prior to Admission medications   Medication Sig Start Date End Date Taking? Authorizing Provider  cephALEXin (KEFLEX) 500 MG capsule Take 1 capsule (500 mg total) by mouth 4 (four) times daily. 08/02/17 08/12/17  Orvil Feil, PA-C  ciprofloxacin (CIPRO) 500 MG tablet Take 1 tablet (500 mg total) by mouth 2 (two) times daily. Patient not taking: Reported on 07/04/2017 04/17/17   Hildred Laser, MD  medroxyPROGESTERone (DEPO-PROVERA) 150 MG/ML injection Inject 1 mL (150 mg total) into the muscle every 3 (three) months. 01/02/17   Hildred Laser, MD     Allergies Patient has no known allergies.  Family History  Problem Relation Age of Onset  . Lupus Maternal Aunt   . Pancreatic cancer Maternal Uncle   . Stroke Maternal Grandfather   . Kidney cancer Neg Hx     Social History Social History  Substance Use Topics  . Smoking status: Never Smoker  . Smokeless tobacco: Never Used  . Alcohol use 0.0 oz/week     Comment: occasionally     Review of Systems  Constitutional: No fever/chills Eyes: No visual changes. No discharge ENT: No upper respiratory complaints. Cardiovascular: no chest pain. Respiratory: no cough. No SOB. Gastrointestinal: No abdominal pain.  No nausea, no vomiting.  No diarrhea.  No constipation. Genitourinary: Patient has dysuria and increased urinary frequency.  Musculoskeletal: Negative for musculoskeletal pain. Skin: Negative for rash, abrasions, lacerations, ecchymosis. Neurological: Negative for headaches, focal weakness or numbness.  ____________________________________________   PHYSICAL EXAM:  VITAL SIGNS: ED Triage Vitals [08/02/17 2033]  Enc Vitals Group     BP 127/76     Pulse Rate 98     Resp 16     Temp 98.2 F (36.8 C)     Temp Source Oral     SpO2 98 %     Weight 136 lb (61.7 kg)     Height  (1.575 m)     Head Circumference      Peak Flow      Pain Score 6     Pain Loc      Pain Edu?      Excl. in GC?  Constitutional: Alert and oriented. Well appearing and in no acute distress. Eyes: Conjunctivae are normal. PERRL. EOMI. Head: Atraumatic. Cardiovascular: Normal rate, regular rhythm. Normal S1 and S2.  Good peripheral circulation. Respiratory: Normal respiratory effort without tachypnea or retractions. Lungs CTAB. Good air entry to the bases with no decreased or absent breath sounds. Gastrointestinal: Bowel sounds 4 quadrants. Patient has suprapubic tenderness to palpation. No guarding or rigidity. No palpable masses. No distention. No CVA  tenderness. Musculoskeletal: Full range of motion to all extremities. No gross deformities appreciated. Neurologic:  Normal speech and language. No gross focal neurologic deficits are appreciated.  Skin:  Skin is warm, dry and intact. No rash noted. Psychiatric: Mood and affect are normal. Speech and behavior are normal. Patient exhibits appropriate insight and judgement.   ____________________________________________   LABS (all labs ordered are listed, but only abnormal results are displayed)  Labs Reviewed  WET PREP, GENITAL - Abnormal; Notable for the following:       Result Value   WBC, Wet Prep HPF POC MODERATE (*)    All other components within normal limits  URINALYSIS, COMPLETE (UACMP) WITH MICROSCOPIC - Abnormal; Notable for the following:    Color, Urine ORANGE (*)    APPearance CLOUDY (*)    Glucose, UA   (*)    Value: TEST NOT REPORTED DUE TO COLOR INTERFERENCE OF URINE PIGMENT   Hgb urine dipstick   (*)    Value: TEST NOT REPORTED DUE TO COLOR INTERFERENCE OF URINE PIGMENT   Bilirubin Urine   (*)    Value: TEST NOT REPORTED DUE TO COLOR INTERFERENCE OF URINE PIGMENT   Ketones, ur   (*)    Value: TEST NOT REPORTED DUE TO COLOR INTERFERENCE OF URINE PIGMENT   Protein, ur   (*)    Value: TEST NOT REPORTED DUE TO COLOR INTERFERENCE OF URINE PIGMENT   Nitrite   (*)    Value: TEST NOT REPORTED DUE TO COLOR INTERFERENCE OF URINE PIGMENT   Leukocytes, UA   (*)    Value: TEST NOT REPORTED DUE TO COLOR INTERFERENCE OF URINE PIGMENT   Bacteria, UA FEW (*)    Squamous Epithelial / LPF 6-30 (*)    All other components within normal limits  URINE CULTURE  POC URINE PREG, ED  POCT PREGNANCY, URINE   ____________________________________________  EKG   ____________________________________________  RADIOLOGY  No results found.  ____________________________________________    PROCEDURES  Procedure(s) performed:    Procedures    Medications  cefTRIAXone  (ROCEPHIN) injection 1 g (1 g Intramuscular Given 08/02/17 2156)     ____________________________________________   INITIAL IMPRESSION / ASSESSMENT AND PLAN / ED COURSE  Pertinent labs & imaging results that were available during my care of the patient were reviewed by me and considered in my medical decision making (see chart for details).  Review of the [place] CSRS was performed in accordance of the [facility] prior to dispensing any controlled drugs.     Assessment and Plan:  Cystitis Patient presents to the emergency department with dysuria and increased urinary frequency refractory to Macrobid. Patient exhibits no CVA tenderness on physical exam. No nausea, vomiting or fever that would indicate possible pyelonephritis. Patient was treated with ceftriaxone in the emergency department and discharged with Keflex. She was advised to follow-up with primary care in 1 week. Urine culture was obtained. All patient questions were answered.     ____________________________________________  FINAL CLINICAL IMPRESSION(S) / ED DIAGNOSES  Final diagnoses:  Acute cystitis  without hematuria      NEW MEDICATIONS STARTED DURING THIS VISIT:  Discharge Medication List as of 08/02/2017  9:48 PM    START taking these medications   Details  cephALEXin (KEFLEX) 500 MG capsule Take 1 capsule (500 mg total) by mouth 4 (four) times daily., Starting Sat 08/02/2017, Until Tue 08/12/2017, Print            This chart was dictated using voice recognition software/Dragon. Despite best efforts to proofread, errors can occur which can change the meaning. Any change was purely unintentional.    Orvil Feil, PA-C 08/02/17 2227    Merrily Brittle, MD 08/02/17 2246

## 2017-08-05 LAB — URINE CULTURE: Culture: >=100000 — AB

## 2017-09-10 DIAGNOSIS — F319 Bipolar disorder, unspecified: Secondary | ICD-10-CM | POA: Insufficient documentation

## 2017-09-15 ENCOUNTER — Other Ambulatory Visit: Payer: Self-pay

## 2017-09-15 ENCOUNTER — Other Ambulatory Visit: Payer: Self-pay | Admitting: Obstetrics and Gynecology

## 2017-09-15 ENCOUNTER — Other Ambulatory Visit: Payer: Medicaid Other

## 2017-09-15 ENCOUNTER — Telehealth: Payer: Self-pay | Admitting: Obstetrics and Gynecology

## 2017-09-15 DIAGNOSIS — R300 Dysuria: Secondary | ICD-10-CM

## 2017-09-15 NOTE — Telephone Encounter (Signed)
ua done and sent culture to [facility]

## 2017-09-15 NOTE — Telephone Encounter (Signed)
Patient called stating she is having burning with urination, irritation and urgency. I put her on the lab schedule for a urine drop off. Thanks

## 2017-09-17 ENCOUNTER — Telehealth: Payer: Self-pay | Admitting: Obstetrics and Gynecology

## 2017-09-17 LAB — URINE CULTURE

## 2017-09-17 NOTE — Telephone Encounter (Signed)
Patient called and stated that she has yet to receive a call back in regards to her results from a urine drop off. The patient would like a call back from a nurse.  Please advise.

## 2017-09-26 ENCOUNTER — Telehealth: Payer: Self-pay

## 2017-09-26 DIAGNOSIS — Z3042 Encounter for surveillance of injectable contraceptive: Secondary | ICD-10-CM

## 2017-09-26 NOTE — Telephone Encounter (Signed)
Tried calling pt several times need to verify number in chart

## 2017-09-26 NOTE — Telephone Encounter (Signed)
Pt had forgotten her medroxyprogesterone for her appt. Pt has requested the NUVARING instead. Phone #[phone]. Pt aware you will here only in am on Monday. Pt received last depo injection on 07/04/17 and was to receive it through dates: 11/23-12/04/2017. If possible she would like a prescription before she will need a pregnancy test. She keeps forgetting her injection.

## 2017-09-29 MED ORDER — ETONOGESTREL-ETHINYL ESTRADIOL 0.12-0.015 MG/24HR VA RING: VAGINAL_RING | VAGINAL | 12 refills | Status: DC

## 2017-09-29 NOTE — Telephone Encounter (Signed)
Ok. I have sent  it in for her (to the [facility] [address] as listed as her preferred pharmacy) . Just contact her to make sure that gets started on it today, and that since she is switching to a different method, she should use a back up method for contraception (such as condoms) for the first 2 weeks. It should remain in for 3 weeks and remove on 4th week to have a period. We should have her follow up in 2-3 months to make sure that she has been able to be more compliant with this method.   Dr. Valentino Saxonherry

## 2017-09-29 NOTE — Telephone Encounter (Signed)
I left detailed message as per Dr. Valentino Saxonherry words as to starting NuvaRing, etc. Pt advised to contact office for any questions.

## 2017-10-28 DIAGNOSIS — F319 Bipolar disorder, unspecified: Secondary | ICD-10-CM

## 2017-10-28 HISTORY — DX: Bipolar disorder, unspecified: F31.9

## 2018-03-10 ENCOUNTER — Ambulatory Visit (INDEPENDENT_AMBULATORY_CARE_PROVIDER_SITE_OTHER): Payer: Medicaid Other | Admitting: Obstetrics and Gynecology

## 2018-03-10 ENCOUNTER — Encounter: Payer: Self-pay | Admitting: Obstetrics and Gynecology

## 2018-03-10 VITALS — BP 122/74 | HR 101 | Ht 62.0 in | Wt 136.3 lb

## 2018-03-10 DIAGNOSIS — B0052 Herpesviral keratitis: Secondary | ICD-10-CM

## 2018-03-10 DIAGNOSIS — N921 Excessive and frequent menstruation with irregular cycle: Secondary | ICD-10-CM

## 2018-03-10 DIAGNOSIS — Z975 Presence of (intrauterine) contraceptive device: Secondary | ICD-10-CM

## 2018-03-10 MED ORDER — ACYCLOVIR 400 MG PO TABS: 400.0000 mg | ORAL_TABLET | Freq: Two times a day (BID) | ORAL | 11 refills | Status: DC

## 2018-03-10 NOTE — Patient Instructions (Signed)

## 2018-03-10 NOTE — Progress Notes (Signed)
Pt stating that she is having bad cramps with n/v. Having outbreaks from genital herpes.

## 2018-03-10 NOTE — Progress Notes (Signed)
    GYNECOLOGY PROGRESS NOTE  Subjective:    Patient ID: Chelsea Parks, female    DOB: [birth year], 22 y.o.   MRN: [id number]  HPI  Patient is a 22 y.o. G77P2002 female who presents for discussion of contraception issue.  Patient notes concern as she states that she had intercourse last week and forgot to replace her Nuvaring for ~ 2 days.  Notes that starting 2 days ago she had some bleeding, unsure if it's her menstrual cycle, however her cycle is not due until ~ 2 weeks from now.  Is concerned that something may be wrong.   Patient also states that she thinks that she is having recurrent herpes infections.  Notes that she has seen "bumps/blisters" that keep reoccurring in the same spot over the past few months, with mild irritation.  Used to take Valtrex as needed, but prescription ran out, and is now more expensive with her current insurance. Notes most recent outbreak may have been last week.   The following portions of the patient's history were reviewed and updated as appropriate: allergies, current medications, past family history, past medical history, past social history, past surgical history and problem list.  Review of Systems Pertinent items noted in HPI and remainder of comprehensive ROS otherwise negative.   Objective:   Blood pressure 122/74, pulse (!) 101, height  (1.575 m), weight 136 lb 4.8 oz (61.8 kg), last menstrual period 03/08/2018, not currently breastfeeding. General appearance: alert and no distress Declined exam today.    Assessment:   Breakthrough bleeding on contraception HSV infection  Plan:   - Advised patient on better compliance with NuvaRing.  Discussed that the breakthrough bleeding likely occurred due to the break during ring removal.  Discussed use of backup method until next cycle occurs. Overall patient notes that she is pleased with NuvaRing.  - HSV infection, recent outbreak last week. Given new prescription for Acyclovir (generic of  Valtrex), will use suppression regimen due to history of multiple outbreaks. Advised on safe sex practices, partner testing.  - Patient to f/u in 2 months for annual exam.   Hildred Laser, MD [facility]

## 2018-05-21 ENCOUNTER — Ambulatory Visit (INDEPENDENT_AMBULATORY_CARE_PROVIDER_SITE_OTHER): Payer: Medicaid Other | Admitting: Obstetrics and Gynecology

## 2018-05-21 ENCOUNTER — Other Ambulatory Visit (HOSPITAL_COMMUNITY)
Admission: RE | Admit: 2018-05-21 | Discharge: 2018-05-21 | Disposition: A | Discharge status: Home / Self Care | Payer: Medicaid Other | Source: Ambulatory Visit | Attending: Obstetrics and Gynecology | Admitting: Obstetrics and Gynecology

## 2018-05-21 ENCOUNTER — Encounter: Payer: Self-pay | Admitting: Obstetrics and Gynecology

## 2018-05-21 VITALS — BP 117/74 | HR 96 | Ht 62.0 in | Wt 133.5 lb

## 2018-05-21 DIAGNOSIS — Z8619 Personal history of other infectious and parasitic diseases: Secondary | ICD-10-CM

## 2018-05-21 DIAGNOSIS — Z309 Encounter for contraceptive management, unspecified: Secondary | ICD-10-CM

## 2018-05-21 DIAGNOSIS — Z9889 Other specified postprocedural states: Secondary | ICD-10-CM

## 2018-05-21 DIAGNOSIS — Z01419 Encounter for gynecological examination (general) (routine) without abnormal findings: Secondary | ICD-10-CM | POA: Insufficient documentation

## 2018-05-21 NOTE — Progress Notes (Signed)
GYNECOLOGY ANNUAL PHYSICAL EXAM PROGRESS NOTE  Subjective:    Chelsea Parks is a 22 y.o. G81P2002 female who presents for an annual exam. The patient has no complaints today. The patient is sexually active.  The patient wears seatbelts: yes. The patient participates in regular exercise: no. Has the patient ever been transfused or tattooed?: no. The patient reports that there is not domestic violence in her life.    Gynecologic History Menarche age: 36 Patient's last menstrual period was 04/24/2018. Contraception: none.  She is currently trying to conceive.  Was previously on NuvaRing.  History of STI's: HSV II Last Pap: unsure, thinks it may have been 1 or 2 years ago. Results were: normal.  Notes h/o a cervical biopsy.    OB History  Gravida Para Term Preterm AB Living  2 2 2  0 0 2  SAB TAB Ectopic Multiple Live Births  0 0 0 0 2    # Outcome Date GA Lbr Len/2nd Weight Sex Delivery Anes PTL Lv  2 Term 05/29/16 [redacted]w[redacted]d 22:15 / 00:28 7 lb 13.2 oz (3.55 kg) M Vag-Spont EPI  LIV     Name: HAYES,BOY Zinia     Apgar1: 8  Apgar5: 9  1 Term 07/27/13 [redacted]w[redacted]d  7 lb 14 oz (3.572 kg) M Vag-Spont   LIV    Past Medical History:  Diagnosis Date  . H/O postpartum depression, currently pregnant   . Herpes genitalis in women     Past Surgical History:  Procedure Laterality Date  . CERVICAL BIOPSY    . no surgical history      Family History  Problem Relation Age of Onset  . Lupus Maternal Aunt   . Pancreatic cancer Maternal Uncle   . Stroke Maternal Grandfather   . Kidney cancer Neg Hx     Social History   Socioeconomic History  . Marital status: Married    Spouse name: Not on file  . Number of children: Not on file  . Years of education: Not on file  . Highest education level: Not on file  Occupational History  . Not on file  Social Needs  . Financial resource strain: Not on file  . Food insecurity:    Worry: Not on file    Inability: Not on file  . Transportation  needs:    Medical: Not on file    Non-medical: Not on file  Tobacco Use  . Smoking status: Never Smoker  . Smokeless tobacco: Never Used  Substance and Sexual Activity  . Alcohol use: Not Currently    Alcohol/week: 0.0 oz    Comment: occasionally  . Drug use: No  . Sexual activity: Yes    Birth control/protection: None  Lifestyle  . Physical activity:    Days per week: Not on file    Minutes per session: Not on file  . Stress: Not on file  Relationships  . Social connections:    Talks on phone: Not on file    Gets together: Not on file    Attends religious service: Not on file    Active member of club or organization: Not on file    Attends meetings of clubs or organizations: Not on file    Relationship status: Not on file  . Intimate partner violence:    Fear of current or ex partner: Not on file    Emotionally abused: Not on file    Physically abused: Not on file    Forced sexual activity:  Not on file  Other Topics Concern  . Not on file  Social History Narrative  . Not on file    Current Outpatient Medications on File Prior to Visit  Medication Sig Dispense Refill  . acyclovir (ZOVIRAX) 400 MG tablet Take 1 tablet (400 mg total) by mouth 2 (two) times daily. 60 tablet 11  . etonogestrel-ethinyl estradiol (NUVARING) 0.12-0.015 MG/24HR vaginal ring Insert vaginally and leave in place for 3 consecutive weeks, then remove for 1 week. (Patient not taking: Reported on 05/21/2018) 1 each 12   No current facility-administered medications on file prior to visit.     No Known Allergies   Review of Systems Constitutional: negative for chills, fatigue, fevers and sweats Eyes: negative for irritation, redness and visual disturbance Ears, nose, mouth, throat, and face: negative for hearing loss, nasal congestion, snoring and tinnitus Respiratory: negative for asthma, cough, sputum Cardiovascular: negative for chest pain, dyspnea, exertional chest pressure/discomfort,  irregular heart beat, palpitations and syncope Gastrointestinal: negative for abdominal pain, change in bowel habits, nausea and vomiting Genitourinary: negative for abnormal menstrual periods, genital lesions, sexual problems and vaginal discharge, dysuria and urinary incontinence Integument/breast: negative for breast lump, breast tenderness and nipple discharge Hematologic/lymphatic: negative for bleeding and easy bruising Musculoskeletal:negative for back pain and muscle weakness Neurological: negative for dizziness, headaches, vertigo and weakness Endocrine: negative for diabetic symptoms including polydipsia, polyuria and skin dryness Allergic/Immunologic: negative for hay fever and urticaria        Objective:  Blood pressure 117/74, pulse 96, height 5\' 2"  (1.575 m), weight 133 lb 8 oz (60.6 kg), last menstrual period 04/24/2018, not currently breastfeeding. Body mass index is 24.42 kg/m.  General Appearance:    Alert, cooperative, no distress, appears stated age  Head:    Normocephalic, without obvious abnormality, atraumatic  Eyes:    PERRL, conjunctiva/corneas clear, EOM's intact, both eyes  Ears:    Normal external ear canals, both ears  Nose:   Nares normal, septum midline, mucosa normal, no drainage or sinus tenderness  Throat:   Lips, mucosa, and tongue normal; teeth and gums normal  Neck:   Supple, symmetrical, trachea midline, no adenopathy; thyroid: no enlargement/tenderness/nodules; no carotid bruit or JVD  Back:     Symmetric, no curvature, ROM normal, no CVA tenderness  Lungs:     Clear to auscultation bilaterally, respirations unlabored  Chest Wall:    No tenderness or deformity   Heart:    Regular rate and rhythm, S1 and S2 normal, no murmur, rub or gallop  Breast Exam:    No tenderness, masses, or nipple abnormality  Abdomen:     Soft, non-tender, bowel sounds active all four quadrants, no masses, no organomegaly.    Genitalia:    Pelvic:external genitalia normal,  vagina without lesions, discharge, or tenderness, rectovaginal septum  normal. Cervix normal in appearance, no cervical motion tenderness, no adnexal masses or tenderness.  Uterus normal size, shape, mobile, regular contours, nontender.  Rectal:    Normal external sphincter.  No hemorrhoids appreciated. Internal exam not done.   Extremities:   Extremities normal, atraumatic, no cyanosis or edema  Pulses:   2+ and symmetric all extremities  Skin:   Skin color, texture, turgor normal, no rashes or lesions  Lymph nodes:   Cervical, supraclavicular, and axillary nodes normal  Neurologic:   CNII-XII intact, normal strength, sensation and reflexes throughout   .  Labs:  Lab Results  Component Value Date   WBC 11.9 (H) 08/10/2016   HGB  13.7 08/10/2016   HCT 39.9 08/10/2016   MCV 92.1 08/10/2016   PLT 228 08/10/2016    Lab Results  Component Value Date   CREATININE 0.84 08/10/2016   BUN 13 08/10/2016   NA 139 08/10/2016   K 3.3 (L) 08/10/2016   CL 105 08/10/2016   CO2 24 08/10/2016    Lab Results  Component Value Date   ALT 17 08/10/2016   AST 19 08/10/2016   ALKPHOS 90 08/10/2016   BILITOT 1.2 08/10/2016    No results found for: TSH   Assessment:    Healthy female exam.   H/o cervical biopsy H/o HSV  Plan:     Blood tests: CBC with diff and Comprehensive metabolic panel. Breast self exam technique reviewed and patient encouraged to perform self-exam monthly. Discussed healthy lifestyle modifications. Contraception: none. Patient desires to conceive.  Encouraged beginning a PNV and folic acid supplementation.  Pap smear performed today as no record in Epic of pap smear, and patient has a h/o a cervical biopsy (although patient reports a normal pap smear) .   H/o HSV, last outbreak in May 2018. Acyclovir as needed. If more than 3 outbreaks in a year, will need suppressive therapy.  Follow up in 1 year, or sooner as needed.    Hildred Laser, MD [facility]

## 2018-05-21 NOTE — Patient Instructions (Signed)
Preventive Care for [place], Female The transition to life after high school as a young adult can be a stressful time with many changes. You may start seeing a primary care physician instead of a pediatrician. This is the time when your health care becomes your responsibility. Preventive care refers to lifestyle choices and visits with your health care provider that can promote health and wellness. What does preventive care include?  A yearly physical exam. This is also called an annual wellness visit.  Dental exams once or twice a year.  Routine eye exams. Ask your health care provider how often you should have your eyes checked.  Personal lifestyle choices, including: ? Daily care of your teeth and gums. ? Regular physical activity. ? Eating a healthy diet. ? Avoiding tobacco and drug use. ? Avoiding or limiting alcohol use. ? Practicing safe sex. ? Taking vitamin and mineral supplements as recommended by your health care provider. What happens during an annual wellness visit? Preventive care starts with a yearly visit to your primary care physician. The services and screenings done by your health care provider during your annual wellness visit will depend on your overall health, lifestyle risk factors, and family history of disease. Counseling Your health care provider may ask you questions about:  Past medical problems and your family's medical history.  Medicines or supplements you take.  Health insurance and access to health care.  Alcohol, tobacco, and drug use.  Your safety at home, work, or school.  Access to firearms.  Emotional well-being and how you cope with stress.  Relationship well-being.  Diet, exercise, and sleep habits.  Your sexual health and activity.  Your methods of birth control.  Your menstrual cycle.  Your pregnancy history.  Screening You may have the following tests or measurements:  Height, weight, and BMI.  Blood  pressure.  Lipid and cholesterol levels.  Tuberculosis skin test.  Skin exam.  Vision and hearing tests.  Screening test for hepatitis.  Screening tests for sexually transmitted diseases (STDs), if you are at risk.  BRCA-related cancer screening. This may be done if you have a family history of breast, ovarian, tubal, or peritoneal cancers.  Pelvic exam and Pap test. This may be done every 3 years starting at age 35.  Vaccines Your health care provider may recommend certain vaccines, such as:  Influenza vaccine. This is recommended every year.  Tetanus, diphtheria, and acellular pertussis (Tdap, Td) vaccine. You may need a Td booster every 10 years.  Varicella vaccine. You may need this if you have not been vaccinated.  HPV vaccine. If you are 25 or younger, you may need three doses over 6 months.  Measles, mumps, and rubella (MMR) vaccine. You may need at least one dose of MMR. You may also need a second dose.  Pneumococcal 13-valent conjugate (PCV13) vaccine. You may need this if you have certain conditions and were not previously vaccinated.  Pneumococcal polysaccharide (PPSV23) vaccine. You may need one or two doses if you smoke cigarettes or if you have certain conditions.  Meningococcal vaccine. One dose is recommended if you are age 24-21 years and a first-year college student living in a residence hall, or if you have one of several medical conditions. You may also need additional booster doses.  Hepatitis A vaccine. You may need this if you have certain conditions or if you travel or work in places where you may be exposed to hepatitis A.  Hepatitis B vaccine. You may need this if  you have certain conditions or if you travel or work in places where you may be exposed to hepatitis B.  Haemophilus influenzae type b (Hib) vaccine. You may need this if you have certain risk factors.  Talk to your health care provider about which screenings and vaccines you need and how  often you need them. What steps can I take to develop healthy behaviors?  Have regular preventive health care visits with your primary care physician and dentist.  Eat a healthy diet.  Drink enough fluid to keep your urine clear or pale yellow.  Stay active. Exercise at least 30 minutes 5 or more days of the week.  Use alcohol responsibly.  Maintain a healthy weight.  Do not use any products that contain nicotine, such as cigarettes, chewing tobacco, and e-cigarettes. If you need help quitting, ask your health care provider.  Do not use drugs.  Practice safe sex.  Use birth control (contraception) to prevent unwanted pregnancy. If you plan to become pregnant, see your health care provider for a pre-conception visit.  Find healthy ways to manage stress. How can I protect myself from injury? Injuries from violence or accidents are the leading cause of death among young adults and can often be prevented. Take these steps to help protect yourself:  Always wear your seat belt while driving or riding in a vehicle.  Do not drive if you have been drinking alcohol. Do not ride with someone who has been drinking.  Do not drive when you are tired or distracted. Do not text while driving.  Wear a helmet and other protective equipment during sports activities.  If you have firearms in your house, make sure you follow all gun safety procedures.  Seek help if you have been bullied, physically abused, or sexually abused.  Use the Internet responsibly to avoid dangers such as online bullying and online sexual predators.  What can I do to cope with stress? Young adults may face many new challenges that can be stressful, such as finding a job, going to college, moving away from home, managing money, being in a relationship, getting married, and having children. To manage stress:  Avoid known stressful situations when you can.  Exercise regularly.  Find a stress-reducing activity that  works best for you. Examples include meditation, yoga, listening to music, or reading.  Spend time in nature.  Keep a journal to write about your stress and how you respond.  Talk to your health care provider about stress. He or she may suggest counseling.  Spend time with supportive friends or family.  Do not cope with stress by: ? Drinking alcohol or using drugs. ? Smoking cigarettes. ? Eating.  Where can I get more information? Learn more about preventive care and healthy habits from:  [place] and Gynecologists: KaraokeExchange.nl  U.S. Probation officer Task Force: StageSync.si  National Adolescent and [place]: StrategicRoad.nl  American Academy of Pediatrics Bright Futures: https://brightfutures.MemberVerification.co.za  Society for Adolescent Health and Medicine: MoralBlog.co.za.aspx  PodExchange.nl: ToyLending.fr  This information is not intended to replace advice given to you by your health care provider. Make sure you discuss any questions you have with your health care provider. Document Released: 02/29/2016 Document Revised: 03/21/2016 Document Reviewed: 02/29/2016 Elsevier Interactive Patient Education  [employer].

## 2018-05-21 NOTE — Progress Notes (Signed)
Pt stated that she is doing well no complaints.  

## 2018-05-22 LAB — COMPREHENSIVE METABOLIC PANEL
ALBUMIN: 4.6 g/dL (ref 3.5–5.5)
ALK PHOS: 90 IU/L (ref 39–117)
ALT: 9 IU/L (ref 0–32)
AST: 15 IU/L (ref 0–40)
Albumin/Globulin Ratio: 2 (ref 1.2–2.2)
BILIRUBIN TOTAL: 0.4 mg/dL (ref 0.0–1.2)
BUN / CREAT RATIO: 11 (ref 9–23)
BUN: 8 mg/dL (ref 6–20)
CHLORIDE: 104 mmol/L (ref 96–106)
CO2: 21 mmol/L (ref 20–29)
Calcium: 9.2 mg/dL (ref 8.7–10.2)
Creatinine, Ser: 0.76 mg/dL (ref 0.57–1.00)
GFR calc Af Amer: 128 mL/min/{1.73_m2} (ref >59)
GFR calc non Af Amer: 111 mL/min/{1.73_m2} (ref >59)
GLOBULIN, TOTAL: 2.3 g/dL (ref 1.5–4.5)
GLUCOSE: 91 mg/dL (ref 65–99)
Potassium: 4 mmol/L (ref 3.5–5.2)
SODIUM: 139 mmol/L (ref 134–144)
Total Protein: 6.9 g/dL (ref 6.0–8.5)

## 2018-05-22 LAB — CBC
HEMATOCRIT: 39.8 % (ref 34.0–46.6)
HEMOGLOBIN: 13.2 g/dL (ref 11.1–15.9)
MCH: 30.3 pg (ref 26.6–33.0)
MCHC: 33.2 g/dL (ref 31.5–35.7)
MCV: 92 fL (ref 79–97)
Platelets: 273 10*3/uL (ref 150–450)
RBC: 4.35 x10E6/uL (ref 3.77–5.28)
RDW: 14 % (ref 12.3–15.4)
WBC: 9 10*3/uL (ref 3.4–10.8)

## 2018-05-25 LAB — CYTOLOGY - PAP
CHLAMYDIA, DNA PROBE: NEGATIVE
Diagnosis: NEGATIVE
NEISSERIA GONORRHEA: NEGATIVE

## 2018-08-13 ENCOUNTER — Encounter: Payer: Self-pay | Admitting: Obstetrics and Gynecology

## 2018-08-13 ENCOUNTER — Ambulatory Visit (INDEPENDENT_AMBULATORY_CARE_PROVIDER_SITE_OTHER): Payer: Self-pay | Admitting: Obstetrics and Gynecology

## 2018-08-13 VITALS — BP 98/60 | HR 87 | Ht 62.0 in | Wt 130.2 lb

## 2018-08-13 DIAGNOSIS — R399 Unspecified symptoms and signs involving the genitourinary system: Secondary | ICD-10-CM

## 2018-08-13 LAB — POCT URINALYSIS DIPSTICK
Bilirubin, UA: NEGATIVE
Blood, UA: NEGATIVE
GLUCOSE UA: NEGATIVE
Ketones, UA: NEGATIVE
Leukocytes, UA: NEGATIVE
Nitrite, UA: NEGATIVE
PH UA: 6 (ref 5.0–8.0)
PROTEIN UA: POSITIVE — AB
Spec Grav, UA: 1.02 (ref 1.010–1.025)
Urobilinogen, UA: 0.2 E.U./dL

## 2018-08-13 NOTE — Progress Notes (Signed)
    GYNECOLOGY PROGRESS NOTE  Subjective:    Patient ID: Chelsea Parks, female    DOB: [birth year], 22 y.o.   MRN: [id number]  HPI  Patient is a 22 y.o. G60P2002 female who presents for complaints of urinary symptoms x 1 week.  Notes that she was having bladder pain and strong urge to urinate that woke her up out of her sleep at the onset of her symptoms.  Has been drinking water and cranberry juice. Symptoms have started to subside. Does have a h/o kidney infection in the past.   The following portions of the patient's history were reviewed and updated as appropriate: allergies, current medications, past family history, past medical history, past social history, past surgical history and problem list.  Review of Systems Pertinent items noted in HPI and remainder of comprehensive ROS otherwise negative.   Objective:   Blood pressure 98/60, pulse 87, height 5\' 2"  (1.575 m), weight 130 lb 3.2 oz (59.1 kg), last menstrual period 07/23/2018. General appearance: alert and no distress Abdomen: soft, non-tender; bowel sounds normal; no masses,  no organomegaly Back: symmetric, no curvature. ROM normal. No CVA tenderness. Extremities: extremities normal, atraumatic, no cyanosis or edema Neurologic: Grossly normal   Labs:  Results for orders placed or performed in visit on 08/13/18  POCT urinalysis dipstick  Result Value Ref Range   Color, UA yellow    Clarity, UA clear    Glucose, UA Negative Negative   Bilirubin, UA neg    Ketones, UA neg    Spec Grav, UA 1.020 1.010 - 1.025   Blood, UA neg    pH, UA 6.0 5.0 - 8.0   Protein, UA Positive (A) Negative   Urobilinogen, UA 0.2 0.2 or 1.0 E.U./dL   Nitrite, UA neg    Leukocytes, UA Negative Negative   Appearance yellow    Odor      Assessment:   UTI symptoms  Plan:   - Patient with UTI symptoms, no evidence of UTI on labs today. Encouraged to continue aggressive hydration with water and cranberry juice for symptoms. Return to  clinic for any scheduled appointments or for any gynecologic concerns as needed.     Hildred Laser, MD [facility]

## 2018-08-13 NOTE — Progress Notes (Signed)
Pt is present today for UTI symptoms. Pt stated that she noticed the symptoms x 1 week. Pt stated that she has burning with urination with odor.

## 2018-09-08 ENCOUNTER — Ambulatory Visit (INDEPENDENT_AMBULATORY_CARE_PROVIDER_SITE_OTHER): Payer: Medicaid Other | Admitting: Obstetrics and Gynecology

## 2018-09-08 ENCOUNTER — Other Ambulatory Visit: Payer: Self-pay | Admitting: Obstetrics and Gynecology

## 2018-09-08 ENCOUNTER — Encounter: Payer: Self-pay | Admitting: Obstetrics and Gynecology

## 2018-09-08 VITALS — BP 119/75 | HR 112 | Ht 62.0 in | Wt 130.8 lb

## 2018-09-08 DIAGNOSIS — O99891 Other specified diseases and conditions complicating pregnancy: Secondary | ICD-10-CM

## 2018-09-08 DIAGNOSIS — Z3201 Encounter for pregnancy test, result positive: Secondary | ICD-10-CM

## 2018-09-08 DIAGNOSIS — Z8659 Personal history of other mental and behavioral disorders: Secondary | ICD-10-CM

## 2018-09-08 DIAGNOSIS — F319 Bipolar disorder, unspecified: Secondary | ICD-10-CM

## 2018-09-08 DIAGNOSIS — O9989 Other specified diseases and conditions complicating pregnancy, childbirth and the puerperium: Secondary | ICD-10-CM

## 2018-09-08 DIAGNOSIS — N926 Irregular menstruation, unspecified: Secondary | ICD-10-CM

## 2018-09-08 DIAGNOSIS — B009 Herpesviral infection, unspecified: Secondary | ICD-10-CM

## 2018-09-08 LAB — POCT URINE PREGNANCY: PREG TEST UR: POSITIVE — AB

## 2018-09-08 MED ORDER — LAMOTRIGINE 25 MG PO TABS: 25.0000 mg | ORAL_TABLET | Freq: Every day | ORAL | 1 refills | Status: DC

## 2018-09-08 MED ORDER — SERTRALINE HCL 50 MG PO TABS: 50.0000 mg | ORAL_TABLET | Freq: Every day | ORAL | 3 refills | Status: DC

## 2018-09-08 MED ORDER — CITRANATAL B-CALM 20-1 MG & 2 X 25 MG PO MISC: 1.0000 | Freq: Every day | ORAL | 11 refills | Status: DC

## 2018-09-08 NOTE — Progress Notes (Signed)
PT is present today for confirmation of pregnancy. Pt LMP 06/23/18. UPT done today results were positive. EPDS= 14. Pt stated that she is doing well no complaints.

## 2018-09-08 NOTE — Patient Instructions (Signed)
First Trimester of Pregnancy The first trimester of pregnancy is from week 1 until the end of week 13 (months 1 through 3). A week after a sperm fertilizes an egg, the egg will implant on the wall of the uterus. This embryo will begin to develop into a baby. Genes from you and your partner will form the baby. The female genes will determine whether the baby will be a boy or a girl. At 6-8 weeks, the eyes and face will be formed, and the heartbeat can be seen on ultrasound. At the end of 12 weeks, all the baby's organs will be formed. Now that you are pregnant, you will want to do everything you can to have a healthy baby. Two of the most important things are to get good prenatal care and to follow your health care provider's instructions. Prenatal care is all the medical care you receive before the baby's birth. This care will help prevent, find, and treat any problems during the pregnancy and childbirth. Body changes during your first trimester Your body goes through many changes during pregnancy. The changes vary from woman to woman.  You may gain or lose a couple of pounds at first.  You may feel sick to your stomach (nauseous) and you may throw up (vomit). If the vomiting is uncontrollable, call your health care provider.  You may tire easily.  You may develop headaches that can be relieved by medicines. All medicines should be approved by your health care provider.  You may urinate more often. Painful urination may mean you have a bladder infection.  You may develop heartburn as a result of your pregnancy.  You may develop constipation because certain hormones are causing the muscles that push stool through your intestines to slow down.  You may develop hemorrhoids or swollen veins (varicose veins).  Your breasts may begin to grow larger and become tender. Your nipples may stick out more, and the tissue that surrounds them (areola) may become darker.  Your gums may bleed and may be  sensitive to brushing and flossing.  Dark spots or blotches (chloasma, mask of pregnancy) may develop on your face. This will likely fade after the baby is born.  Your menstrual periods will stop.  You may have a loss of appetite.  You may develop cravings for certain kinds of food.  You may have changes in your emotions from day to day, such as being excited to be pregnant or being concerned that something may go wrong with the pregnancy and baby.  You may have more vivid and strange dreams.  You may have changes in your hair. These can include thickening of your hair, rapid growth, and changes in texture. Some women also have hair loss during or after pregnancy, or hair that feels dry or thin. Your hair will most likely return to normal after your baby is born.  What to expect at prenatal visits During a routine prenatal visit:  You will be weighed to make sure you and the baby are growing normally.  Your blood pressure will be taken.  Your abdomen will be measured to track your baby's growth.  The fetal heartbeat will be listened to between weeks 10 and 14 of your pregnancy.  Test results from any previous visits will be discussed.  Your health care provider may ask you:  How you are feeling.  If you are feeling the baby move.  If you have had any abnormal symptoms, such as leaking fluid, bleeding, severe headaches,   or abdominal cramping.  If you are using any tobacco products, including cigarettes, chewing tobacco, and electronic cigarettes.  If you have any questions.  Other tests that may be performed during your first trimester include:  Blood tests to find your blood type and to check for the presence of any previous infections. The tests will also be used to check for low iron levels (anemia) and protein on red blood cells (Rh antibodies). Depending on your risk factors, or if you previously had diabetes during pregnancy, you may have tests to check for high blood  sugar that affects pregnant women (gestational diabetes).  Urine tests to check for infections, diabetes, or protein in the urine.  An ultrasound to confirm the proper growth and development of the baby.  Fetal screens for spinal cord problems (spina bifida) and Down syndrome.  HIV (human immunodeficiency virus) testing. Routine prenatal testing includes screening for HIV, unless you choose not to have this test.  You may need other tests to make sure you and the baby are doing well.  Follow these instructions at home: Medicines  Follow your health care provider's instructions regarding medicine use. Specific medicines may be either safe or unsafe to take during pregnancy.  Take a prenatal vitamin that contains at least 600 micrograms (mcg) of folic acid.  If you develop constipation, try taking a stool softener if your health care provider approves. Eating and drinking  Eat a balanced diet that includes fresh fruits and vegetables, whole grains, good sources of protein such as meat, eggs, or tofu, and low-fat dairy. Your health care provider will help you determine the amount of weight gain that is right for you.  Avoid raw meat and uncooked cheese. These carry germs that can cause birth defects in the baby.  Eating four or five small meals rather than three large meals a day may help relieve nausea and vomiting. If you start to feel nauseous, eating a few soda crackers can be helpful. Drinking liquids between meals, instead of during meals, also seems to help ease nausea and vomiting.  Limit foods that are high in fat and processed sugars, such as fried and sweet foods.  To prevent constipation: ? Eat foods that are high in fiber, such as fresh fruits and vegetables, whole grains, and beans. ? Drink enough fluid to keep your urine clear or pale yellow. Activity  Exercise only as directed by your health care provider. Most women can continue their usual exercise routine during  pregnancy. Try to exercise for 30 minutes at least 5 days a week. Exercising will help you: ? Control your weight. ? Stay in shape. ? Be prepared for labor and delivery.  Experiencing pain or cramping in the lower abdomen or lower back is a good sign that you should stop exercising. Check with your health care provider before continuing with normal exercises.  Try to avoid standing for long periods of time. Move your legs often if you must stand in one place for a long time.  Avoid heavy lifting.  Wear low-heeled shoes and practice good posture.  You may continue to have sex unless your health care provider tells you not to. Relieving pain and discomfort  Wear a good support bra to relieve breast tenderness.  Take warm sitz baths to soothe any pain or discomfort caused by hemorrhoids. Use hemorrhoid cream if your health care provider approves.  Rest with your legs elevated if you have leg cramps or low back pain.  If you develop   varicose veins in your legs, wear support hose. Elevate your feet for 15 minutes, 3-4 times a day. Limit salt in your diet. Prenatal care  Schedule your prenatal visits by the twelfth week of pregnancy. They are usually scheduled monthly at first, then more often in the last 2 months before delivery.  Write down your questions. Take them to your prenatal visits.  Keep all your prenatal visits as told by your health care provider. This is important. Safety  Wear your seat belt at all times when driving.  Make a list of emergency phone numbers, including numbers for family, friends, the hospital, and police and fire departments. General instructions  Ask your health care provider for a referral to a local prenatal education class. Begin classes no later than the beginning of month 6 of your pregnancy.  Ask for help if you have counseling or nutritional needs during pregnancy. Your health care provider can offer advice or refer you to specialists for help  with various needs.  Do not use hot tubs, steam rooms, or saunas.  Do not douche or use tampons or scented sanitary pads.  Do not cross your legs for long periods of time.  Avoid cat litter boxes and soil used by cats. These carry germs that can cause birth defects in the baby and possibly loss of the fetus by miscarriage or stillbirth.  Avoid all smoking, herbs, alcohol, and medicines not prescribed by your health care provider. Chemicals in these products affect the formation and growth of the baby.  Do not use any products that contain nicotine or tobacco, such as cigarettes and e-cigarettes. If you need help quitting, ask your health care provider. You may receive counseling support and other resources to help you quit.  Schedule a dentist appointment. At home, brush your teeth with a soft toothbrush and be gentle when you floss. Contact a health care provider if:  You have dizziness.  You have mild pelvic cramps, pelvic pressure, or nagging pain in the abdominal area.  You have persistent nausea, vomiting, or diarrhea.  You have a bad smelling vaginal discharge.  You have pain when you urinate.  You notice increased swelling in your face, hands, legs, or ankles.  You are exposed to fifth disease or chickenpox.  You are exposed to German measles (rubella) and have never had it. Get help right away if:  You have a fever.  You are leaking fluid from your vagina.  You have spotting or bleeding from your vagina.  You have severe abdominal cramping or pain.  You have rapid weight gain or loss.  You vomit blood or material that looks like coffee grounds.  You develop a severe headache.  You have shortness of breath.  You have any kind of trauma, such as from a fall or a car accident. Summary  The first trimester of pregnancy is from week 1 until the end of week 13 (months 1 through 3).  Your body goes through many changes during pregnancy. The changes vary from  woman to woman.  You will have routine prenatal visits. During those visits, your health care provider will examine you, discuss any test results you may have, and talk with you about how you are feeling. This information is not intended to replace advice given to you by your health care provider. Make sure you discuss any questions you have with your health care provider. Document Released: 10/08/2001 Document Revised: 09/25/2016 Document Reviewed: 09/25/2016 Elsevier Interactive Patient Education  2018 Else[employer]   Inc[employer]  

## 2018-09-08 NOTE — Progress Notes (Signed)
   GYNECOLOGY CLINIC PROGRESS NOTE Subjective:    Chelsea Parks is a 22 y.o. [id number]  female who presents for evaluation of amenorrhea. She believes she could be pregnant. Pregnancy is desired. Pregnancy was planned. Sexual Activity: single partner, contraception: none. Current symptoms also include: morning sickness, nausea, positive home pregnancy test and migraines. Last period was normal. Patient's last menstrual period was 07/24/2018.   Patient notes that her PCP informed her that she needed to discuss her history of severe postpartum depression (with onset approximately 8-9 months after her delivery), [facility] (review of chart notes she was also diagnosed with bipolar disorder, and placed on Lithium and Seroquel).  Patient notes that she self- discontinued the medications last year. Notes that she is having some mild mood changes now, but is unsure if it is due to the hormones of a new pregnancy or her history of depression.  Patient also notes that she thinks she is having an HSV outbreak. She has a h/o outbreaks in the past. She wonders if it will be safe to continue current medication.   The following portions of the patient's history were reviewed and updated as appropriate: allergies, current medications, past family history, past medical history, past social history, past surgical history and problem list.  Review of Systems Pertinent items noted in HPI and remainder of comprehensive ROS otherwise negative.     Objective:    BP 119/75   Pulse (!) 112   Ht 5\' 2"  (1.575 m)   Wt 130 lb 12.8 oz (59.3 kg)   LMP 07/24/2018   BMI 23.92 kg/m  General: alert, no distress and no acute distress    Lab Review Urine HCG: positive    Assessment:    Absence of menstruation.   HSV infection H/o severe PPD (required hospitalization) and bipolar depression  Plan:   - Pregnancy Test: Positive: EDC: 04/30/2019, with EGA of 6.4 weeks. Briefly  discussed pre-natal care options. Pregnancy, Childbirth and the Newborn book given. Encouraged well-balanced diet, plenty of rest when needed, pre-natal vitamins daily and walking for exercise. Discussed self-help for nausea, avoiding OTC medications until consulting provider or pharmacist, other than Tylenol as needed, minimal caffeine (1-2 cups daily) and avoiding alcohol.  She will return in 2 weeks for a NOB intake and ultrasound.  - H/o severe PPD and bipolar depression. Patient currently noting mood change symptoms.  Discussed that she should begin therapy early in the pregnancy to prevent progression.  Paitent notes she does not want to be on lithium due to previous side effects. Will start on Zoloft (for depression) and Lamictal (mood stabilizer). Discussed that both medications are Category C.  - HSV infection, patient noting current outbreak, is on medications. Discussed that she will need to continue her suppression therapy during the pregnancy.    A total of 15 minutes were spent face-to-face with the patient during this encounter and over half of that time dealt with counseling and coordination of care.    Hildred Laserherry, Natally Ribera, MD [facility]

## 2018-09-09 ENCOUNTER — Encounter: Payer: Self-pay | Admitting: Obstetrics and Gynecology

## 2018-09-09 DIAGNOSIS — B009 Herpesviral infection, unspecified: Secondary | ICD-10-CM | POA: Insufficient documentation

## 2018-09-09 DIAGNOSIS — O9989 Other specified diseases and conditions complicating pregnancy, childbirth and the puerperium: Secondary | ICD-10-CM

## 2018-09-09 DIAGNOSIS — O99891 Other specified diseases and conditions complicating pregnancy: Secondary | ICD-10-CM | POA: Insufficient documentation

## 2018-09-09 DIAGNOSIS — Z8659 Personal history of other mental and behavioral disorders: Secondary | ICD-10-CM | POA: Insufficient documentation

## 2018-09-10 ENCOUNTER — Telehealth: Payer: Self-pay

## 2018-09-10 ENCOUNTER — Telehealth: Payer: Self-pay | Admitting: Obstetrics and Gynecology

## 2018-09-10 ENCOUNTER — Other Ambulatory Visit: Payer: Self-pay

## 2018-09-10 NOTE — Telephone Encounter (Signed)
Pt is aware that MS([facility]teele) received confirmation that pt has [facility]. Pt is aware that now she should be able to pick up her medication without any problems.

## 2018-09-10 NOTE — Telephone Encounter (Signed)
Pt called no answer LM via voicemail to call the office to speak more about the [facility] to her about.

## 2018-09-10 NOTE — Telephone Encounter (Signed)
Spoke with pt concerning medicaid not paying for her Zoloft. Pt was informed that as soon as she is approved for medicaid pregnancy she will be covered for that medication. Pt was advised to wait for a couple of days. Pt stated that she understood.

## 2018-09-10 NOTE — Telephone Encounter (Signed)
The patient called and stated that she needs to speak with [place] again. The patient did not disclose any other information. Please advise.

## 2018-09-10 NOTE — Telephone Encounter (Signed)
Pt called no answer LM via voicemail letting pt know that we were working on getting her prenatal vitamins covered or send in another form of prenatal. Pt was advised to call the office.

## 2018-09-10 NOTE — Telephone Encounter (Signed)
Spoke with pt and inform [facility] of the [facility]. Pt is also aware that we know about her medicaid issues and MS(steele) is working on fixing her medicaid issues.

## 2018-09-10 NOTE — Telephone Encounter (Signed)
The patient called and stated that she was informed by Dr. Valentino Saxonherry to call her back. The patient is requesting a call back if possible. Please advise.

## 2018-09-14 NOTE — Telephone Encounter (Signed)
The patient called and left a voice message today at 1:29 PM asking for her nurse, Geraldo PitterFikisha, to contact her, please advise, thanks.

## 2018-09-18 ENCOUNTER — Other Ambulatory Visit: Payer: Self-pay

## 2018-09-18 MED ORDER — PROMETHAZINE HCL 25 MG PO TABS: 25.0000 mg | ORAL_TABLET | Freq: Four times a day (QID) | ORAL | 1 refills | Status: DC | PRN

## 2018-09-23 ENCOUNTER — Other Ambulatory Visit: Payer: Self-pay

## 2018-09-23 ENCOUNTER — Ambulatory Visit (INDEPENDENT_AMBULATORY_CARE_PROVIDER_SITE_OTHER): Payer: Medicaid Other | Admitting: Obstetrics and Gynecology

## 2018-09-23 ENCOUNTER — Ambulatory Visit (INDEPENDENT_AMBULATORY_CARE_PROVIDER_SITE_OTHER): Payer: Medicaid Other

## 2018-09-23 VITALS — BP 93/66 | HR 76 | Ht 62.0 in | Wt 132.7 lb

## 2018-09-23 DIAGNOSIS — Z3481 Encounter for supervision of other normal pregnancy, first trimester: Secondary | ICD-10-CM

## 2018-09-23 DIAGNOSIS — Z3201 Encounter for pregnancy test, result positive: Secondary | ICD-10-CM

## 2018-09-23 DIAGNOSIS — O219 Vomiting of pregnancy, unspecified: Secondary | ICD-10-CM

## 2018-09-23 DIAGNOSIS — Z3687 Encounter for antenatal screening for uncertain dates: Secondary | ICD-10-CM | POA: Diagnosis not present

## 2018-09-23 MED ORDER — PROMETHAZINE HCL 25 MG PO TABS: 25.0000 mg | ORAL_TABLET | Freq: Four times a day (QID) | ORAL | 1 refills | Status: DC | PRN

## 2018-09-23 NOTE — Progress Notes (Signed)
Chelsea Parks presents for NOB nurse interview visit. Pregnancy confirmation done 09/08/18. G3. P2-. Pregnancy education material explained and given. NO cats in home. NOB labs ordered. TSH/HbgA1c ordered due to BMI 30 or greater. Sickle cell ordered due to patient's race. HIV labs and drug screen were explained and ordered. PNV encouraged. Genetic screening options discussed. Genetic testing:Unsure. Patient may discuss with the provider. Patient to follow up with provider in 3 weeks for NOB physical. All questions answered.

## 2018-09-24 LAB — URINALYSIS, ROUTINE W REFLEX MICROSCOPIC
BILIRUBIN UA: NEGATIVE
Glucose, UA: NEGATIVE
KETONES UA: NEGATIVE
Nitrite, UA: NEGATIVE
PROTEIN UA: NEGATIVE
RBC UA: NEGATIVE
SPEC GRAV UA: 1.015 (ref 1.005–1.030)
Urobilinogen, Ur: 0.2 mg/dL (ref 0.2–1.0)
pH, UA: 6 (ref 5.0–7.5)

## 2018-09-24 LAB — MICROSCOPIC EXAMINATION
BACTERIA UA: NONE SEEN
Casts: NONE SEEN /lpf
RBC, UA: NONE SEEN /hpf (ref 0–2)

## 2018-09-24 LAB — HIV ANTIBODY (ROUTINE TESTING W REFLEX): HIV SCREEN 4TH GENERATION: NONREACTIVE

## 2018-09-24 LAB — RUBELLA SCREEN: RUBELLA: 1.34 {index} (ref >0.99)

## 2018-09-24 LAB — ABO AND RH: Rh Factor: POSITIVE

## 2018-09-24 LAB — HEPATITIS B SURFACE ANTIGEN: Hepatitis B Surface Ag: NEGATIVE

## 2018-09-24 LAB — VARICELLA ZOSTER ANTIBODY, IGG: Varicella zoster IgG: 739 index (ref >165)

## 2018-09-24 LAB — RPR: RPR: NONREACTIVE

## 2018-09-24 LAB — ANTIBODY SCREEN: Antibody Screen: NEGATIVE

## 2018-09-25 LAB — MONITOR DRUG PROFILE 14(MW)
AMPHETAMINE SCREEN URINE: NEGATIVE ng/mL
BARBITURATE SCREEN URINE: NEGATIVE ng/mL
BENZODIAZEPINE SCREEN, URINE: NEGATIVE ng/mL
BUPRENORPHINE, URINE: NEGATIVE ng/mL
CANNABINOIDS UR QL SCN: NEGATIVE ng/mL
Cocaine (Metab) Scrn, Ur: NEGATIVE ng/mL
Creatinine(Crt), U: 68.2 mg/dL (ref 20.0–300.0)
Fentanyl, Urine: NEGATIVE pg/mL
Meperidine Screen, Urine: NEGATIVE ng/mL
Methadone Screen, Urine: NEGATIVE ng/mL
OXYCODONE+OXYMORPHONE UR QL SCN: NEGATIVE ng/mL
Opiate Scrn, Ur: NEGATIVE ng/mL
PH UR, DRUG SCRN: 6.1 (ref 4.5–8.9)
PROPOXYPHENE SCREEN URINE: NEGATIVE ng/mL
Phencyclidine Qn, Ur: NEGATIVE ng/mL
SPECIFIC GRAVITY: 1.017
Tramadol Screen, Urine: NEGATIVE ng/mL

## 2018-09-25 LAB — NICOTINE SCREEN, URINE: Cotinine Ql Scrn, Ur: NEGATIVE ng/mL

## 2018-09-25 LAB — URINE CULTURE, OB REFLEX: ORGANISM ID, BACTERIA: NO GROWTH

## 2018-09-25 LAB — CULTURE, OB URINE

## 2018-09-26 LAB — GC/CHLAMYDIA PROBE AMP
Chlamydia trachomatis, NAA: NEGATIVE
Neisseria gonorrhoeae by PCR: NEGATIVE

## 2018-09-26 NOTE — Progress Notes (Signed)
I have reviewed the record and concur with patient management and plan.  Sharlotte Baka, MD [facility]     

## 2018-10-09 ENCOUNTER — Ambulatory Visit (INDEPENDENT_AMBULATORY_CARE_PROVIDER_SITE_OTHER): Payer: Medicaid Other | Admitting: Obstetrics and Gynecology

## 2018-10-09 VITALS — BP 99/64 | HR 90 | Ht 62.0 in | Wt 130.6 lb

## 2018-10-09 DIAGNOSIS — O26891 Other specified pregnancy related conditions, first trimester: Secondary | ICD-10-CM

## 2018-10-09 DIAGNOSIS — O9989 Other specified diseases and conditions complicating pregnancy, childbirth and the puerperium: Secondary | ICD-10-CM

## 2018-10-09 DIAGNOSIS — Z3A11 11 weeks gestation of pregnancy: Secondary | ICD-10-CM

## 2018-10-09 DIAGNOSIS — O99341 Other mental disorders complicating pregnancy, first trimester: Secondary | ICD-10-CM

## 2018-10-09 DIAGNOSIS — Z1379 Encounter for other screening for genetic and chromosomal anomalies: Secondary | ICD-10-CM

## 2018-10-09 DIAGNOSIS — Z3491 Encounter for supervision of normal pregnancy, unspecified, first trimester: Secondary | ICD-10-CM

## 2018-10-09 DIAGNOSIS — R110 Nausea: Secondary | ICD-10-CM

## 2018-10-09 DIAGNOSIS — Z8659 Personal history of other mental and behavioral disorders: Secondary | ICD-10-CM

## 2018-10-09 DIAGNOSIS — B009 Herpesviral infection, unspecified: Secondary | ICD-10-CM

## 2018-10-09 DIAGNOSIS — O99891 Other specified diseases and conditions complicating pregnancy: Secondary | ICD-10-CM

## 2018-10-09 DIAGNOSIS — F319 Bipolar disorder, unspecified: Secondary | ICD-10-CM

## 2018-10-09 LAB — POCT URINALYSIS DIPSTICK OB
Bilirubin, UA: NEGATIVE
GLUCOSE, UA: NEGATIVE
Ketones, UA: NEGATIVE
Leukocytes, UA: NEGATIVE
NITRITE UA: NEGATIVE
POC,PROTEIN,UA: NEGATIVE
RBC UA: NEGATIVE
SPEC GRAV UA: 1.01 (ref 1.010–1.025)
Urobilinogen, UA: 0.2 E.U./dL
pH, UA: 7 (ref 5.0–8.0)

## 2018-10-09 MED ORDER — ONDANSETRON 4 MG PO TBDP: 4.0000 mg | ORAL_TABLET | Freq: Four times a day (QID) | ORAL | 1 refills | Status: DC | PRN

## 2018-10-09 NOTE — Progress Notes (Signed)
OBSTETRIC INITIAL PRENATAL VISIT  Subjective:    Chelsea Parks is being seen today for her first obstetrical visit.  This is not a planned pregnancy. She is a [id number] female at [redacted]w[redacted]d gestation, Estimated Date of Delivery: 04/29/19 with last menstrual period was 07/24/2018, consistent with 8 week sono. Her obstetrical history is significant for history of severe postpartum depression in last pregnancy (requiring hospitalization), Bipolar disorder (on Zoloft and Lamictal, initiated at pregnancy confirmation visit), and h/o HSV-II.  Relationship with FOB: significant other, living together. Patient does not intend to breast feed. Pregnancy history fully reviewed.    OB History  Gravida Para Term Preterm AB Living  3 2 2  0 0 2  SAB TAB Ectopic Multiple Live Births  0 0 0 0 2    # Outcome Date GA Lbr Len/2nd Weight Sex Delivery Anes PTL Lv  3 Current           2 Term 05/29/16 [redacted]w[redacted]d 22:15 / 00:28 7 lb 13.2 oz (3.55 kg) M Vag-Spont EPI  LIV     Name: HAYES,BOY Beaulah     Apgar1: 8  Apgar5: 9  1 Term 07/27/13 [redacted]w[redacted]d  7 lb 14 oz (3.572 kg) M Vag-Spont   LIV    Gynecologic History:  Last pap smear was 05/21/2018.  Results were normal.  Denies h/o abnormal pap smears in the past.  Admits history of STIs: HSV-II (~ 5 outbreaks per year), on suppression therapy.    Past Medical History:  Diagnosis Date  . H/O postpartum depression, currently pregnant   . Herpes genitalis in women      Family History  Problem Relation Age of Onset  . Healthy Mother   . Healthy Father   . Lupus Maternal Aunt   . Pancreatic cancer Maternal Uncle   . Stroke Maternal Grandfather   . Kidney cancer Neg Hx      Past Surgical History:  Procedure Laterality Date  . CERVICAL BIOPSY    . no surgical history       Social History   Socioeconomic History  . Marital status: Married    Spouse name: Not on file  . Number of children: Not on file  . Years of education: Not on file  . Highest education  level: Not on file  Occupational History  . Not on file  Social Needs  . Financial resource strain: Not on file  . Food insecurity:    Worry: Not on file    Inability: Not on file  . Transportation needs:    Medical: Not on file    Non-medical: Not on file  Tobacco Use  . Smoking status: Former Games developer  . Smokeless tobacco: Never Used  Substance and Sexual Activity  . Alcohol use: Not Currently    Alcohol/week: 0.0 standard drinks    Comment: occasionally  . Drug use: No  . Sexual activity: Yes    Birth control/protection: None  Lifestyle  . Physical activity:    Days per week: Not on file    Minutes per session: Not on file  . Stress: Not on file  Relationships  . Social connections:    Talks on phone: Not on file    Gets together: Not on file    Attends religious service: Not on file    Active member of club or organization: Not on file    Attends meetings of clubs or organizations: Not on file    Relationship status: Not on file  .  Intimate partner violence:    Fear of current or ex partner: Not on file    Emotionally abused: Not on file    Physically abused: Not on file    Forced sexual activity: Not on file  Other Topics Concern  . Not on file  Social History Narrative  . Not on file     Current Outpatient Medications on File Prior to Visit  Medication Sig Dispense Refill  . lamoTRIgine (LAMICTAL) 25 MG tablet Take 1 tablet (25 mg total) by mouth daily. Take 1 tablet by mouth daily x 2 weeks, then increase to 2 tablets by mouth daily. 45 tablet 1  . Prenat w/o A FeCbnFeGlu-FA &B6 (CITRANATAL B-CALM) 20-1 MG & 2 x 25 MG MISC Take 1 tablet by mouth daily. 30 each 11  . promethazine (PHENERGAN) 25 MG tablet Take 1 tablet (25 mg total) by mouth every 6 (six) hours as needed for nausea or vomiting. 20 tablet 1  . sertraline (ZOLOFT) 50 MG tablet Take 1 tablet (50 mg total) by mouth daily. 30 tablet 3   No current facility-administered medications on file prior to  visit.      No Known Allergies    Review of Systems General:Not Present- Fever, Weight Loss and Weight Gain. Skin:Not Present- Rash. HEENT:Not Present- Blurred Vision, Headache and Bleeding Gums. Respiratory:Not Present- Difficulty Breathing. Breast:Not Present- Breast Mass. Cardiovascular:Not Present- Chest Pain, Elevated Blood Pressure, Fainting / Blacking Out and Shortness of Breath. Gastrointestinal:Not Present- Abdominal Pain, Constipation, Vomiting.  Positive for Nausea, not relieved with Phenergan. Has been unable to get Diclegis covered by insurance.  Female Genitourinary:Not Present- Frequency, Painful Urination, Pelvic Pain, Vaginal Bleeding, Vaginal Discharge, Contractions, regular, Fetal Movements Decreased, Urinary Complaints and Vaginal Fluid. Musculoskeletal:Not Present- Back Pain and Leg Cramps. Neurological:Not Present- Dizziness. Psychiatric:Not Present- Depression.     Objective:   Blood pressure 99/64, pulse 90, height 5\' 2"  (1.575 m), weight 130 lb 9.6 oz (59.2 kg), last menstrual period 07/24/2018.  Body mass index is 23.89 kg/m.  General Appearance:    Alert, cooperative, no distress, appears stated age  Head:    Normocephalic, without obvious abnormality, atraumatic  Eyes:    PERRL, conjunctiva/corneas clear, EOM's intact, both eyes  Ears:    Normal external ear canals, both ears  Nose:   Nares normal, septum midline, mucosa normal, no drainage or sinus tenderness  Throat:   Lips, mucosa, and tongue normal; teeth and gums normal  Neck:   Supple, symmetrical, trachea midline, no adenopathy; thyroid: no enlargement/tenderness/nodules; no carotid bruit or JVD  Back:     Symmetric, no curvature, ROM normal, no CVA tenderness  Lungs:     Clear to auscultation bilaterally, respirations unlabored  Chest Wall:    No tenderness or deformity   Heart:    Regular rate and rhythm, S1 and S2 normal, no murmur, rub or gallop  Breast Exam:    No tenderness,  masses, or nipple abnormality  Abdomen:     Soft, non-tender, bowel sounds active all four quadrants, no masses, no organomegaly.  FH 11.  FHT 171  bpm.  Genitalia:    Pelvic:external genitalia normal, vagina without lesions, discharge, or tenderness, rectovaginal septum  normal. Cervix normal in appearance, no cervical motion tenderness, no adnexal masses or tenderness.  Pregnancy positive findings: uterine enlargement: 11 wk size, nontender.   Rectal:    Normal external sphincter.  No hemorrhoids appreciated. Internal exam not done.   Extremities:   Extremities normal, atraumatic, no cyanosis  or edema  Pulses:   2+ and symmetric all extremities  Skin:   Skin color, texture, turgor normal, no rashes or lesions  Lymph nodes:   Cervical, supraclavicular, and axillary nodes normal  Neurologic:   CNII-XII intact, normal strength, sensation and reflexes throughout      Assessment:   Pregnancy at 11 and 1/7 weeks  Nausea Bipolar 1 disorder (HCC) HSV infection History of postpartum depression, currently pregnant Nausea  Plan:   1. Pregnancy at [redacted] weeks gestation  - Initial labs reviewed. - Prenatal vitamins encouraged. - Problem list reviewed and updated. - New OB counseling:  The patient has been given an overview regarding routine prenatal care.  Recommendations regarding diet, weight gain, and exercise in pregnancy were given.  - Prenatal testing, optional genetic testing, and ultrasound use in pregnancy were reviewed.   1st trimester screen, 2nd trimester screen, and cell-free DNA testing discussed: requested cell-free DNA testing with [facility]. - Benefits of Breast Feeding were discussed. The patient is encouraged to consider nursing her baby post partum.  2. Bipolar disorder - Patient began noting mood symptoms at confirmation visit.  Had not been on medications for ~ over 1 year. Had previously been on Lithium and Seroquel, however did not like the way they made her feel. Currently  on Zoloft and Lamictal, initiated last visit. Patient denies any side effects or issues at this time.  Encouraged to also establish with a therapist.  Will need growth scan at 28-32 weeks due to medication use.   3. H/o severe postpartum depression  - Occurred ~ 8 months after prior pregnancy, [facility]. Discussed risk of recurrence during and after pregnancy. Will monitor closely.  Currently on medication (Zoloft).   4. H/o HSV-II - Patient  has had approximately 5 outbreaks during the past year. Is on suppression therapy with Acyclovir. To continue throughout the pregnancy.   5. Nausea - Not relieved with Phenergan. Will change prescription to Zofran.  Has had difficulty getting Diclegis covered by insurance. Will contact pharmacy.   Follow up in 4 weeks.   The patient has Medicaid.  [facility] Medicaid Risk Screening Form completed today  50% of 30 min visit spent on counseling and coordination of care.     Hildred Laserherry, Hayden Kihara, MD [facility]

## 2018-10-09 NOTE — Progress Notes (Signed)
ROB-Pt present today for her NOB PE. Pt stated that she is doing well no complaints.

## 2018-10-15 LAB — MATERNIT21  PLUS CORE+ESS+SCA, BLOOD
CHROMOSOME 18: NEGATIVE
CHROMOSOME 21: NEGATIVE
Chromosome 13: NEGATIVE
Fetal Fraction: 11
Y Chromosome: NOT DETECTED

## 2018-10-16 ENCOUNTER — Telehealth: Payer: Self-pay | Admitting: Obstetrics and Gynecology

## 2018-10-16 NOTE — Telephone Encounter (Signed)
Pt was called and informed that [facility] had to review [facility] results before they could be released. Pt was informed that their is no providers in the office and that DJE would review new week and I would call her back then. Pt stated that she understood.

## 2018-10-16 NOTE — Telephone Encounter (Signed)
The patient is asking for her Matern[facility] 21 results today if possible.  She was wanting it printed out to be picked up, but also is asking her nurse to call her, please advise, thanks.

## 2018-10-20 NOTE — Telephone Encounter (Signed)
Please see  Result notes encounter. Spoke with pt on 10/20/18 and she is aware of genetic testing and sex of the fetus.

## 2018-10-28 NOTE — L&D Delivery Note (Signed)
Delivery Summary for [facility]  Labor Events:   Preterm labor:   Rupture date:   Rupture time:   Rupture type:   Fluid Color:   Induction:   Augmentation:   Complications:   Cervical ripening:          Delivery:   Episiotomy:   Lacerations:   Repair suture:   Repair # of packets:   Blood loss (ml): 250   Information for the patient's newbornGUILIANNA, Chelsea Parks [[id number]]    Delivery 04/30/2019 3:04 AM by  Vaginal, Spontaneous Sex:  female Gestational Age: [redacted]w[redacted]d Delivery Clinician:   Living?:         APGARS  One minute Five minutes Ten minutes  Skin color:        Heart rate:        Grimace:        Muscle tone:        Breathing:        Totals: 8  9      Presentation/position:      Resuscitation:   Cord information:    Disposition of cord blood:     Blood gases sent?  Complications:   Placenta: Delivered:       appearance Newborn Measurements: Weight: 8 lb 12.7 oz (3990 g)  Height: 21.06"  Head circumference:    Chest circumference:    Other providers:    Additional  information: Forceps:   Vacuum:   Breech:   Observed anomalies        Operative Delivery Note At 3:04 AM a viable and healthy female was delivered via Vaginal, Spontaneous.  Presentation: vertex; Position: Left,, Occiput,, Anterior; Station: +3.  Delivery of the head: 3:04 a.m.  Delivery of remainder of the body occurred approximately 30-45 seconds after maneuvers performed. First maneuver: McRoberts position , performed at 3:04 a.m. Second maneuver: Suprapubic pressure , performed at 3:04 a.m.   Third maneuver: ,   Fourth maneuver: ,   Fifth maneuver: ,   Sixth maneuver: ,    Verbal consent: obtained from patient.  APGAR: 8, 9; weight  3990 grams.    Placenta status: spontaneously removed, intact.   Cord: 3-vessel with the following complications: None.  Cord pH: not obtained. Delayed cord clamping observed.   Anesthesia:  Epidural Episiotomy:  None Lacerations:   None Suture Repair: None Est. Blood Loss (mL):  250  Mom to postpartum.  Baby to Couplet care / Skin to Skin.  Rubie Maid, MD 04/30/2019, 3:20 AM

## 2018-11-05 ENCOUNTER — Encounter: Payer: Self-pay | Admitting: Obstetrics and Gynecology

## 2018-11-05 ENCOUNTER — Ambulatory Visit (INDEPENDENT_AMBULATORY_CARE_PROVIDER_SITE_OTHER): Payer: Medicaid Other | Admitting: Obstetrics and Gynecology

## 2018-11-05 VITALS — BP 93/64 | HR 80 | Ht 62.0 in | Wt 129.7 lb

## 2018-11-05 DIAGNOSIS — Z3491 Encounter for supervision of normal pregnancy, unspecified, first trimester: Secondary | ICD-10-CM

## 2018-11-05 DIAGNOSIS — Z3492 Encounter for supervision of normal pregnancy, unspecified, second trimester: Secondary | ICD-10-CM

## 2018-11-05 DIAGNOSIS — Z1379 Encounter for other screening for genetic and chromosomal anomalies: Secondary | ICD-10-CM

## 2018-11-05 DIAGNOSIS — Z3A15 15 weeks gestation of pregnancy: Secondary | ICD-10-CM

## 2018-11-05 DIAGNOSIS — F319 Bipolar disorder, unspecified: Secondary | ICD-10-CM

## 2018-11-05 LAB — POCT URINALYSIS DIPSTICK OB
BILIRUBIN UA: NEGATIVE
GLUCOSE, UA: NEGATIVE
Ketones, UA: NEGATIVE
Leukocytes, UA: NEGATIVE
Nitrite, UA: NEGATIVE
POC,PROTEIN,UA: NEGATIVE
RBC UA: NEGATIVE
SPEC GRAV UA: 1.01 (ref 1.010–1.025)
Urobilinogen, UA: 0.2 E.U./dL
pH, UA: 6 (ref 5.0–8.0)

## 2018-11-05 MED ORDER — SERTRALINE HCL 100 MG PO TABS: 100.0000 mg | ORAL_TABLET | Freq: Every day | ORAL | 1 refills | Status: DC

## 2018-11-05 NOTE — Progress Notes (Signed)
Patient comes in today for her 15 week OB visit. She has several concerns today. Patient states that she has some bleeding after sex and sometimes when she wipes. Denies heavy bleeding. Patient states that she is having a lot more fatigue. She said that she will stay in the bed until 3 in the afternoon sometimes.

## 2018-11-05 NOTE — Progress Notes (Signed)
ROB: Patient states that she is having difficulty getting out of bed during the day and is affected her caring for her family.  She believes it is part of her depression.  Increase Zoloft to 100 daily.  Patient desires AFP today.  FAS ultrasound next visit.  Patient had one episode of spotting after intercourse and we discussed this in detail.

## 2018-11-07 LAB — AFP, SERUM, OPEN SPINA BIFIDA
AFP MoM: 0.51
AFP VALUE AFPOSL: 16.4 ng/mL
GEST. AGE ON COLLECTION DATE: 15 wk
Maternal Age At EDD: 24 yr
OSBR RISK 1 IN: 10000
Test Results:: NEGATIVE
Weight: 129 [lb_av]

## 2018-11-09 ENCOUNTER — Telehealth: Payer: Self-pay | Admitting: Obstetrics and Gynecology

## 2018-11-09 NOTE — Telephone Encounter (Signed)
The patient sent a MyChart message stating that she may have a possible yeast infection. The patient would like to know if she needs to schedule an appointment or if something can be sent into her pharmacy. The patient is requesting a call back or response on MyChart if possible. Please advise.

## 2018-11-09 NOTE — Telephone Encounter (Signed)
LM for patient to return call.

## 2018-11-12 ENCOUNTER — Other Ambulatory Visit: Payer: Self-pay

## 2018-11-12 MED ORDER — CITRANATAL B-CALM 20-1 MG & 2 X 25 MG PO MISC: 1.0000 | Freq: Every day | ORAL | 11 refills | Status: DC

## 2018-11-13 NOTE — Telephone Encounter (Signed)
Patient did not return call.

## 2018-12-01 ENCOUNTER — Other Ambulatory Visit: Payer: Self-pay | Admitting: Obstetrics and Gynecology

## 2018-12-01 DIAGNOSIS — Z3491 Encounter for supervision of normal pregnancy, unspecified, first trimester: Secondary | ICD-10-CM

## 2018-12-02 ENCOUNTER — Ambulatory Visit (INDEPENDENT_AMBULATORY_CARE_PROVIDER_SITE_OTHER): Payer: Medicaid Other

## 2018-12-02 ENCOUNTER — Ambulatory Visit (INDEPENDENT_AMBULATORY_CARE_PROVIDER_SITE_OTHER): Payer: Medicaid Other | Admitting: Obstetrics and Gynecology

## 2018-12-02 VITALS — BP 91/58 | HR 86 | Wt 133.8 lb

## 2018-12-02 DIAGNOSIS — O35BXX0 Maternal care for other (suspected) fetal abnormality and damage, fetal cardiac anomalies, not applicable or unspecified: Secondary | ICD-10-CM

## 2018-12-02 DIAGNOSIS — N760 Acute vaginitis: Secondary | ICD-10-CM

## 2018-12-02 DIAGNOSIS — O358XX0 Maternal care for other (suspected) fetal abnormality and damage, not applicable or unspecified: Secondary | ICD-10-CM

## 2018-12-02 DIAGNOSIS — B373 Candidiasis of vulva and vagina: Secondary | ICD-10-CM

## 2018-12-02 DIAGNOSIS — B9689 Other specified bacterial agents as the cause of diseases classified elsewhere: Secondary | ICD-10-CM

## 2018-12-02 DIAGNOSIS — B3731 Acute candidiasis of vulva and vagina: Secondary | ICD-10-CM

## 2018-12-02 DIAGNOSIS — O2612 Low weight gain in pregnancy, second trimester: Secondary | ICD-10-CM

## 2018-12-02 DIAGNOSIS — Z3482 Encounter for supervision of other normal pregnancy, second trimester: Secondary | ICD-10-CM

## 2018-12-02 DIAGNOSIS — Z3491 Encounter for supervision of normal pregnancy, unspecified, first trimester: Secondary | ICD-10-CM

## 2018-12-02 LAB — POCT URINALYSIS DIPSTICK OB
Bilirubin, UA: NEGATIVE
Glucose, UA: NEGATIVE
Ketones, UA: NEGATIVE
LEUKOCYTES UA: NEGATIVE
NITRITE UA: NEGATIVE
PH UA: 6 (ref 5.0–8.0)
POC,PROTEIN,UA: NEGATIVE
RBC UA: NEGATIVE
SPEC GRAV UA: 1.015 (ref 1.010–1.025)
UROBILINOGEN UA: 0.2 U/dL

## 2018-12-02 MED ORDER — FLUCONAZOLE 150 MG PO TABS: 150.0000 mg | ORAL_TABLET | Freq: Once | ORAL | 3 refills | Status: AC

## 2018-12-02 MED ORDER — METRONIDAZOLE 500 MG PO TABS: 500.0000 mg | ORAL_TABLET | Freq: Two times a day (BID) | ORAL | 0 refills | Status: DC

## 2018-12-02 NOTE — Progress Notes (Signed)
ROB: Patient thinks she has a yeast infection. Still noting some nausea, eats small portions. Worried about weight gain. Is taking prenatals with Vitamin B6. Also discussed increasing protein/carb load. Wet prep with positive hyphae and clue cells. Will treat with Flagyl and Diflucan. RTC in 4 weeks, for repeat ultrasound to f/u echogenic focus noted on today's anatomy scan at that time.

## 2018-12-02 NOTE — Progress Notes (Signed)
ROB-Pt stated that she has cracks of the skin on the vaginal area. Pt think she may have a yeast infection.

## 2018-12-30 ENCOUNTER — Ambulatory Visit (INDEPENDENT_AMBULATORY_CARE_PROVIDER_SITE_OTHER): Payer: Medicaid Other | Admitting: Obstetrics and Gynecology

## 2018-12-30 ENCOUNTER — Encounter: Payer: Self-pay | Admitting: Obstetrics and Gynecology

## 2018-12-30 VITALS — BP 110/68 | HR 98 | Ht 62.0 in | Wt 140.3 lb

## 2018-12-30 DIAGNOSIS — Z3482 Encounter for supervision of other normal pregnancy, second trimester: Secondary | ICD-10-CM

## 2018-12-30 NOTE — Progress Notes (Signed)
Patient comes in for ROB visit. She has no concerns today.  

## 2018-12-30 NOTE — Progress Notes (Signed)
ROB:  No complaints.  Intracardiac echogenic focus discussed.  Patient says 1 of her other children have this and she is "not worried".  Follow-up ultrasound at 28 weeks discussed.  Not scheduled yet.

## 2019-01-20 ENCOUNTER — Telehealth: Payer: Self-pay

## 2019-01-20 NOTE — Telephone Encounter (Signed)
Pt called to inform her the new scheduling due to the COVID-19. Pt was informed that she would need to have her 1 hr GTT done at her next visit on 01/28/19. Pt was given guide lines to the test.

## 2019-01-27 ENCOUNTER — Telehealth: Payer: Self-pay

## 2019-01-27 ENCOUNTER — Encounter: Payer: Medicaid Other | Admitting: Obstetrics and Gynecology

## 2019-01-27 NOTE — Telephone Encounter (Signed)
Coronavirus (COVID-19) Are you at risk?  Are you at risk for the Coronavirus (COVID-19)?  To be considered HIGH RISK for Coronavirus (COVID-19), you have to meet the following criteria:  . Traveled to [place], [place], [name], [place] or [place]; or in the [place] to [place], [place], [place], or [place]; and have fever, cough, and shortness of breath within the last 2 weeks of travel OR . Been in close contact with a person diagnosed with COVID-19 within the last 2 weeks and have fever, cough, and shortness of breath . IF YOU DO NOT MEET THESE CRITERIA, YOU ARE CONSIDERED LOW RISK FOR COVID-19.  What to do if you are HIGH RISK for COVID-19?  . If you are having a medical emergency, call 911. . Seek medical care right away. Before you go to a doctor's office, urgent care or emergency department, call ahead and tell them about your recent travel, contact with someone diagnosed with COVID-19, and your symptoms. You should receive instructions from your physician's office regarding next steps of care.  . When you arrive at healthcare provider, tell the healthcare staff immediately you have returned from visiting [place], [place], [place], [place] or [name]; or traveled in the [place] to [place], [place], [place], or [place]; in the last two weeks or you have been in close contact with a person diagnosed with COVID-19 in the last 2 weeks.   . Tell the health care staff about your symptoms: fever, cough and shortness of breath. . After you have been seen by a medical provider, you will be either: o Tested for (COVID-19) and discharged home on quarantine except to seek medical care if symptoms worsen, and asked to  - Stay home and avoid contact with others until you get your results (4-5 days)  - Avoid travel on public transportation if possible (such as bus, train, or airplane) or o Sent to the Emergency Department by EMS for evaluation, COVID-19 testing, and possible  admission depending on your condition and test results.  What to do if you are LOW RISK for COVID-19?  Reduce your risk of any infection by using the same precautions used for avoiding the common cold or flu:  . Wash your hands often with soap and warm water for at least 20 seconds.  If soap and water are not readily available, use an alcohol-based hand sanitizer with at least 60% alcohol.  . If coughing or sneezing, cover your mouth and nose by coughing or sneezing into the elbow areas of your shirt or coat, into a tissue or into your sleeve (not your hands). . Avoid shaking hands with others and consider head nods or verbal greetings only. . Avoid touching your eyes, nose, or mouth with unwashed hands.  . Avoid close contact with people who are sick. . Avoid places or events with large numbers of people in one location, like concerts or sporting events. . Carefully consider travel plans you have or are making. . If you are planning any travel outside or inside the [place], visit the CDC's Travelers' Health webpage for the latest health notices. . If you have some symptoms but not all symptoms, continue to monitor at home and seek medical attention if your symptoms worsen. . If you are having a medical emergency, call 911.   ADDITIONAL HEALTHCARE OPTIONS FOR PATIENTS  [facility] / e-Visit: [email or web address]         [facility]: [phone]  [facility]: [phone]                   [facility]: [phone]   Prescreened- neg. cm  

## 2019-01-28 ENCOUNTER — Ambulatory Visit (INDEPENDENT_AMBULATORY_CARE_PROVIDER_SITE_OTHER): Payer: Medicaid Other | Admitting: Obstetrics and Gynecology

## 2019-01-28 ENCOUNTER — Other Ambulatory Visit: Payer: Self-pay

## 2019-01-28 ENCOUNTER — Other Ambulatory Visit: Payer: Medicaid Other

## 2019-01-28 ENCOUNTER — Other Ambulatory Visit (INDEPENDENT_AMBULATORY_CARE_PROVIDER_SITE_OTHER): Payer: Medicaid Other

## 2019-01-28 VITALS — BP 105/70 | HR 96 | Ht 62.0 in | Wt 143.2 lb

## 2019-01-28 DIAGNOSIS — Z3A27 27 weeks gestation of pregnancy: Secondary | ICD-10-CM

## 2019-01-28 DIAGNOSIS — O358XX0 Maternal care for other (suspected) fetal abnormality and damage, not applicable or unspecified: Secondary | ICD-10-CM | POA: Diagnosis not present

## 2019-01-28 DIAGNOSIS — Z23 Encounter for immunization: Secondary | ICD-10-CM

## 2019-01-28 DIAGNOSIS — Z3482 Encounter for supervision of other normal pregnancy, second trimester: Secondary | ICD-10-CM

## 2019-01-28 DIAGNOSIS — O35BXX0 Maternal care for other (suspected) fetal abnormality and damage, fetal cardiac anomalies, not applicable or unspecified: Secondary | ICD-10-CM

## 2019-01-28 LAB — POCT URINALYSIS DIPSTICK OB
Bilirubin, UA: NEGATIVE
Blood, UA: NEGATIVE
Glucose, UA: NEGATIVE
Ketones, UA: NEGATIVE
Leukocytes, UA: NEGATIVE
Nitrite, UA: NEGATIVE
Spec Grav, UA: 1.02 (ref 1.010–1.025)
Urobilinogen, UA: 0.2 E.U./dL
pH, UA: 7 (ref 5.0–8.0)

## 2019-01-28 NOTE — Patient Instructions (Signed)
Q: Why are visitor restrictions different for maternity care areas? [place] is restricting visitors for the duration of the patient's hospitalization. The birth of a child involves the mother, considered the patient, and a birthing partner. These are unprecedented times and we are making the exception to allow a birthing partner to be a part of the patient unit. No other guests will be allowed in our [facility] at [facility] and at  [facility].  Q: Are credentialed doulas allowed to support their existing patients? We acknowledge the value these doula partnerships offer our care teams and many birthing families in our communities. Each laboring mother is allowed one birthing partner of the patient's choosing for her entire hospitalization.  Q: Are visitor restrictions different for hospitalized children? Pediatric patients (infants and children under 17 years of age), such as those in the Children's Unit, Pediatric ICU and NICU, will be allowed two visitors (parents or legal guardians)  Q: Are pregnant women at an increased risk for COVID-19? The American College of Obstetricians and Gynecologists (ACOG) is monitoring closely the coronavirus pandemic. With the limited information available, data does not indicate pregnant women are at an increased risk. However, pregnant women are known to be at greater risk for respiratory infections like flu. With that in mind, expectant mothers are considered an at-risk population for COVID-19, according to ACOG.  Q: Are newborns at an increased risk for COVID-19? A limited sample of COVID-19 data with newborns indicates the virus is not transferred to the infant during pregnancy. However, postpartum separation is recommended by the Centers for Disease Control (CDC). As a result [facility] recommends and strongly encourages temporary separation of moms and babies who test positive for COVID-19 or are awaiting results to rule out  COVID-19 based on CDC guidelines.  Q: If you have a suspected case of COVID-19, is the NICU couplet care room an option? No. If either patient is considered at-risk for having COVID-19, the [facility] at [facility] will not use the NICU couplet care rooms for that family.  Q: [place] is urging that elective procedures be postponed. What is considered elective for women's and children's service line? NOT ELECTIVE: Obstetric procedures, even those with an element of choice on timing, are not considered elective. Circumcisions are considered elective procedures, however, these do not deplete blood products and other resources, which is the spirit in which the COVID-19 postponement of elective procedures was intended. Therefore, circumcisions will be allowed.  ELECTIVE: Postpartum tubal ligations are considered elective and should be postponed.   supports as much as possible the medical care team working with the patient's individual needs to address timing during these unprecedented times. We seek the support of our medical care team in preserving needed resources throughout our crisis response to COVID-19.  Q: How does COVID-19 impact breastfeeding? Breastmilk is safe for your baby - even if the mother has tested positive for COVID-19. We suggest the mother pump her milk and have a healthy family member feed the baby to protect the baby from getting the virus.  If a COVID-19+ mother decides to breastfeed after discharge, we suggest proper protective equipment be worn and hand hygiene be performed before and after feeding the infant.  Q: Should we urge patients to avoid baby showers and large gatherings? Yes. As has been recommended for all citizens in our communities, gatherings of 10 or more should be avoided - pregnant or not. Seek creative options   for "hosting" baby showers through electronic means that honor the request for social distancing during this  time of heightened awareness.  Q: Should patients miss their prenatal appointments? No. Prenatal visits are NOT elective. While we want to limit contact and exposure, prenatal care is vital right now. Contact your physician's office if you have concerns about your visits. We are limiting outpatient office visits to the patient and one guest in order to reduce the potential for exposure.  Q: What if a pregnant woman feels sick? Should she miss her prenatal visit then? A pregnant woman experiencing coronavirus-like symptoms (i.e., cough, fever, difficulty breathing, shortness of breath, gastrointestinal issues) should contact her pregnancy care provider by phone. Her medical professional can best determine whether she should use a video visit or possibly go to a collection site to be tested for COVID-19. Contacting her primary care provider or her pregnancy care provider is her first step.  Q: What can I do about childbirth education? All the classes are cancelled. The [facility] Centers will offer online learning to support mothers on their journey.  We currently offer Understanding Childbirth, Understanding Breastfeeding and Understanding Newborn Care as an online class.  Please visit our website, [email or web address], to register for an online class.  Q: How can I keep from getting COVID-19? Together, we can reduce the risk of exposure to the virus and help you and your family remain healthy and safe. One of the best ways to protect yourself is to wash your hands frequently using soap and water. Also, you should avoid touching your eyes, nose and mouth with unwashed hands, avoid physical contact with others and practice social distancing.   

## 2019-01-28 NOTE — Progress Notes (Signed)
ROB: Patient doing well with the exception of occasional issues with restless leg syndrome while sleeping.  COVID-19 discussed.  Follow-up for IEF ultrasound scheduled.

## 2019-01-28 NOTE — Progress Notes (Signed)
Patient comes in today for ROB appointment. She states that she is having some restless leg syndrome. She states that she has had this problem in the past.

## 2019-01-29 LAB — CBC
Hematocrit: 31.5 % — ABNORMAL LOW (ref 34.0–46.6)
Hemoglobin: 10.9 g/dL — ABNORMAL LOW (ref 11.1–15.9)
MCH: 32.2 pg (ref 26.6–33.0)
MCHC: 34.6 g/dL (ref 31.5–35.7)
MCV: 93 fL (ref 79–97)
Platelets: 203 10*3/uL (ref 150–450)
RBC: 3.39 x10E6/uL — ABNORMAL LOW (ref 3.77–5.28)
RDW: 11.9 % (ref 11.7–15.4)
WBC: 9.3 10*3/uL (ref 3.4–10.8)

## 2019-01-29 LAB — GLUCOSE, 1 HOUR GESTATIONAL: Gestational Diabetes Screen: 74 mg/dL (ref 65–139)

## 2019-01-29 LAB — RPR: RPR Ser Ql: NONREACTIVE

## 2019-02-04 ENCOUNTER — Other Ambulatory Visit: Payer: Self-pay

## 2019-02-04 MED ORDER — DOXYLAMINE-PYRIDOXINE 10-10 MG PO TBEC: 10.0000 mg | DELAYED_RELEASE_TABLET | Freq: Two times a day (BID) | ORAL | 1 refills | Status: DC

## 2019-02-09 ENCOUNTER — Other Ambulatory Visit: Payer: Self-pay | Admitting: Obstetrics and Gynecology

## 2019-02-24 ENCOUNTER — Other Ambulatory Visit: Payer: Self-pay

## 2019-02-24 MED ORDER — SERTRALINE HCL 100 MG PO TABS: 100.0000 mg | ORAL_TABLET | Freq: Every day | ORAL | 1 refills | Status: DC

## 2019-03-08 NOTE — Progress Notes (Signed)
ROB-Pt is present today for prenatal care. Pt stated that feeling fetal movement, pain and pressure in the vaginal area, braxton hicks contractions and no vaginal bleeding. Pt stated that she has noticed spasms in the abd area at night while she is sleeping which causes her entire body to jerk.

## 2019-03-09 ENCOUNTER — Encounter: Payer: Medicaid Other | Admitting: Obstetrics and Gynecology

## 2019-03-10 ENCOUNTER — Ambulatory Visit (INDEPENDENT_AMBULATORY_CARE_PROVIDER_SITE_OTHER): Payer: Medicaid Other | Admitting: Obstetrics and Gynecology

## 2019-03-10 ENCOUNTER — Encounter: Payer: Self-pay | Admitting: Obstetrics and Gynecology

## 2019-03-10 ENCOUNTER — Other Ambulatory Visit: Payer: Self-pay

## 2019-03-10 VITALS — BP 115/74 | HR 91 | Wt 151.5 lb

## 2019-03-10 DIAGNOSIS — Z3483 Encounter for supervision of other normal pregnancy, third trimester: Secondary | ICD-10-CM

## 2019-03-10 DIAGNOSIS — O99343 Other mental disorders complicating pregnancy, third trimester: Secondary | ICD-10-CM

## 2019-03-10 DIAGNOSIS — F319 Bipolar disorder, unspecified: Secondary | ICD-10-CM

## 2019-03-10 LAB — POCT URINALYSIS DIPSTICK OB
Bilirubin, UA: NEGATIVE
Blood, UA: NEGATIVE
Glucose, UA: NEGATIVE
Ketones, UA: NEGATIVE
Leukocytes, UA: NEGATIVE
Nitrite, UA: NEGATIVE
Spec Grav, UA: 1.015 (ref 1.010–1.025)
Urobilinogen, UA: 0.2 E.U./dL
pH, UA: 7.5 (ref 5.0–8.0)

## 2019-03-10 NOTE — Progress Notes (Signed)
ROB: Patient notes pelvic pain and hip pain, feels like her hips are shifting. Advised on pelvic girdle.  Also noting twitching of her muscles.  Advised on intake of Magnesium and potassium (through food supplementation or tablets). Also notes not eating much due to decreased appetite., advised on healthy snacks. RTC in 2-3 weeks. For growth scan at that time due to bipolar medication treatment.  Will need to begin HSV suppression dosing at next visit.

## 2019-03-30 ENCOUNTER — Encounter: Payer: Self-pay | Admitting: Obstetrics and Gynecology

## 2019-03-30 ENCOUNTER — Other Ambulatory Visit: Payer: Self-pay

## 2019-03-30 ENCOUNTER — Ambulatory Visit (INDEPENDENT_AMBULATORY_CARE_PROVIDER_SITE_OTHER): Payer: Medicaid Other

## 2019-03-30 ENCOUNTER — Ambulatory Visit (INDEPENDENT_AMBULATORY_CARE_PROVIDER_SITE_OTHER): Payer: Medicaid Other | Admitting: Obstetrics and Gynecology

## 2019-03-30 VITALS — BP 106/74 | HR 109 | Ht 62.0 in | Wt 155.1 lb

## 2019-03-30 DIAGNOSIS — F319 Bipolar disorder, unspecified: Secondary | ICD-10-CM

## 2019-03-30 DIAGNOSIS — Z362 Encounter for other antenatal screening follow-up: Secondary | ICD-10-CM | POA: Diagnosis not present

## 2019-03-30 DIAGNOSIS — Z3483 Encounter for supervision of other normal pregnancy, third trimester: Secondary | ICD-10-CM

## 2019-03-30 DIAGNOSIS — O99343 Other mental disorders complicating pregnancy, third trimester: Secondary | ICD-10-CM

## 2019-03-30 LAB — POCT URINALYSIS DIPSTICK OB
Bilirubin, UA: NEGATIVE
Blood, UA: NEGATIVE
Glucose, UA: NEGATIVE
Ketones, UA: NEGATIVE
Leukocytes, UA: NEGATIVE
Nitrite, UA: NEGATIVE
POC,PROTEIN,UA: NEGATIVE
Spec Grav, UA: 1.01 (ref 1.010–1.025)
Urobilinogen, UA: 0.2 E.U./dL
pH, UA: 7.5 (ref 5.0–8.0)

## 2019-03-30 NOTE — Progress Notes (Signed)
ROB: Patient without complaint.  Occasional contractions but not regular.  Reports daily fetal movement.  Ultrasound today reveals growth at 62nd percentile.  Patient denies any issues with her mood or depression symptoms.  GBS-GC/CT performed today.

## 2019-03-30 NOTE — Progress Notes (Signed)
Patient comes in today for ROB visit. She has no appetite now.

## 2019-03-31 ENCOUNTER — Other Ambulatory Visit: Payer: Self-pay | Admitting: Obstetrics and Gynecology

## 2019-04-01 LAB — STREP GP B NAA: Strep Gp B NAA: NEGATIVE

## 2019-04-05 LAB — GC/CHLAMYDIA PROBE AMP
Chlamydia trachomatis, NAA: NEGATIVE
Neisseria Gonorrhoeae by PCR: NEGATIVE

## 2019-04-14 ENCOUNTER — Telehealth: Payer: Self-pay

## 2019-04-14 NOTE — Telephone Encounter (Signed)
Pt prescreening no symptoms has face mask.    Coronavirus (COVID-19) Are you at risk?  Are you at risk for the Coronavirus (COVID-19)?  To be considered HIGH RISK for Coronavirus (COVID-19), you have to meet the following criteria:  . Traveled to [place], [place], [place], [place] or [place]; or in the [place] to [place], [place], [place], or [place]; and have fever, cough, and shortness of breath within the last 2 weeks of travel OR . Been in close contact with a person diagnosed with COVID-19 within the last 2 weeks and have fever, cough, and shortness of breath . IF YOU DO NOT MEET THESE CRITERIA, YOU ARE CONSIDERED LOW RISK FOR COVID-19.  What to do if you are HIGH RISK for COVID-19?  Marland Kitchen If you are having a medical emergency, call 911. . Seek medical care right away. Before you go to a doctor's office, urgent care or emergency department, call ahead and tell them about your recent travel, contact with someone diagnosed with COVID-19, and your symptoms. You should receive instructions from your physician's office regarding next steps of care.  . When you arrive at healthcare provider, tell the healthcare staff immediately you have returned from visiting [place], [place], [place], [place] or [place]; or traveled in the [place] to [place], [place], [place], or [place]; in the last two weeks or you have been in close contact with a person diagnosed with COVID-19 in the last 2 weeks.   . Tell the health care staff about your symptoms: fever, cough and shortness of breath. . After you have been seen by a medical provider, you will be either: o Tested for (COVID-19) and discharged home on quarantine except to seek medical care if symptoms worsen, and asked to  - Stay home and avoid contact with others until you get your results (4-5 days)  - Avoid travel on public transportation if possible (such as bus, train, or airplane) or o Sent to the Emergency Department by EMS for  evaluation, COVID-19 testing, and possible admission depending on your condition and test results.  What to do if you are LOW RISK for COVID-19?  Reduce your risk of any infection by using the same precautions used for avoiding the common cold or flu:  Marland Kitchen Wash your hands often with soap and warm water for at least 20 seconds.  If soap and water are not readily available, use an alcohol-based hand sanitizer with at least 60% alcohol.  . If coughing or sneezing, cover your mouth and nose by coughing or sneezing into the elbow areas of your shirt or coat, into a tissue or into your sleeve (not your hands). . Avoid shaking hands with others and consider head nods or verbal greetings only. . Avoid touching your eyes, nose, or mouth with unwashed hands.  . Avoid close contact with people who are sick. . Avoid places or events with large numbers of people in one location, like concerts or sporting events. . Carefully consider travel plans you have or are making. . If you are planning any travel outside or inside the [name], visit the CDC's Travelers' Health webpage for the latest health notices. . If you have some symptoms but not all symptoms, continue to monitor at home and seek medical attention if your symptoms worsen. . If you are having a medical emergency, call 911.   ADDITIONAL HEALTHCARE OPTIONS FOR PATIENTS  [facility] / e-Visit: [email or web address]  [facility]: [phone]  [facility]: [phone]                   [facility]: [phone]  

## 2019-04-14 NOTE — Progress Notes (Signed)
ROB-Pt present today for prenatal care. Pt stated that she is having vaginal pain and pressure.

## 2019-04-15 ENCOUNTER — Encounter: Payer: Self-pay | Admitting: Obstetrics and Gynecology

## 2019-04-15 ENCOUNTER — Other Ambulatory Visit: Payer: Self-pay

## 2019-04-15 ENCOUNTER — Ambulatory Visit (INDEPENDENT_AMBULATORY_CARE_PROVIDER_SITE_OTHER): Payer: Medicaid Other | Admitting: Obstetrics and Gynecology

## 2019-04-15 VITALS — BP 108/66 | HR 80 | Wt 155.5 lb

## 2019-04-15 DIAGNOSIS — Z3483 Encounter for supervision of other normal pregnancy, third trimester: Secondary | ICD-10-CM

## 2019-04-15 LAB — POCT URINALYSIS DIPSTICK OB
Bilirubin, UA: NEGATIVE
Blood, UA: NEGATIVE
Glucose, UA: NEGATIVE
Ketones, UA: NEGATIVE
Leukocytes, UA: NEGATIVE
Nitrite, UA: NEGATIVE
POC,PROTEIN,UA: NEGATIVE
Spec Grav, UA: 1.015 (ref 1.010–1.025)
Urobilinogen, UA: 0.2 E.U./dL
pH, UA: 7 (ref 5.0–8.0)

## 2019-04-15 NOTE — Progress Notes (Signed)
ROB: Notes more vaginal pain and pressure.  Notes increase in [place].  Discussed labor precautions. Notes she is planning on vasectomy for contraception. RTC in 1 week.

## 2019-04-22 ENCOUNTER — Ambulatory Visit (INDEPENDENT_AMBULATORY_CARE_PROVIDER_SITE_OTHER): Payer: Medicaid Other | Admitting: Obstetrics and Gynecology

## 2019-04-22 ENCOUNTER — Encounter: Payer: Self-pay | Admitting: Obstetrics and Gynecology

## 2019-04-22 ENCOUNTER — Other Ambulatory Visit: Payer: Self-pay

## 2019-04-22 VITALS — BP 93/63 | HR 91 | Ht 62.0 in | Wt 158.5 lb

## 2019-04-22 DIAGNOSIS — O480 Post-term pregnancy: Secondary | ICD-10-CM

## 2019-04-22 NOTE — Progress Notes (Signed)
Patient comes in today for ROB visit. No concerns today. 

## 2019-04-22 NOTE — Progress Notes (Signed)
ROB: No complaints.  Patient just "tired of being pregnant".  Occasional contractions.  Signs and symptoms of labor discussed and reviewed.  BPP with next visit.  Postdate induction scheduled for July 8th.

## 2019-04-28 ENCOUNTER — Other Ambulatory Visit: Payer: Self-pay | Admitting: Internal Medicine

## 2019-04-28 ENCOUNTER — Telehealth: Payer: Self-pay

## 2019-04-28 DIAGNOSIS — O480 Post-term pregnancy: Secondary | ICD-10-CM

## 2019-04-28 NOTE — Telephone Encounter (Signed)
Coronavirus (COVID-19) Are you at risk?  Are you at risk for the Coronavirus (COVID-19)?  To be considered HIGH RISK for Coronavirus (COVID-19), you have to meet the following criteria:  . Traveled to [place], [place], [name], [place] or [place]; or in the [place] to [place], [place], [place], or [place]; and have fever, cough, and shortness of breath within the last 2 weeks of travel OR . Been in close contact with a person diagnosed with COVID-19 within the last 2 weeks and have fever, cough, and shortness of breath . IF YOU DO NOT MEET THESE CRITERIA, YOU ARE CONSIDERED LOW RISK FOR COVID-19.  What to do if you are HIGH RISK for COVID-19?  . If you are having a medical emergency, call 911. . Seek medical care right away. Before you go to a doctor's office, urgent care or emergency department, call ahead and tell them about your recent travel, contact with someone diagnosed with COVID-19, and your symptoms. You should receive instructions from your physician's office regarding next steps of care.  . When you arrive at healthcare provider, tell the healthcare staff immediately you have returned from visiting [place], [place], [place], [place] or [name]; or traveled in the [place] to [place], [place], [place], or [place]; in the last two weeks or you have been in close contact with a person diagnosed with COVID-19 in the last 2 weeks.   . Tell the health care staff about your symptoms: fever, cough and shortness of breath. . After you have been seen by a medical provider, you will be either: o Tested for (COVID-19) and discharged home on quarantine except to seek medical care if symptoms worsen, and asked to  - Stay home and avoid contact with others until you get your results (4-5 days)  - Avoid travel on public transportation if possible (such as bus, train, or airplane) or o Sent to the Emergency Department by EMS for evaluation, COVID-19 testing, and possible  admission depending on your condition and test results.  What to do if you are LOW RISK for COVID-19?  Reduce your risk of any infection by using the same precautions used for avoiding the common cold or flu:  . Wash your hands often with soap and warm water for at least 20 seconds.  If soap and water are not readily available, use an alcohol-based hand sanitizer with at least 60% alcohol.  . If coughing or sneezing, cover your mouth and nose by coughing or sneezing into the elbow areas of your shirt or coat, into a tissue or into your sleeve (not your hands). . Avoid shaking hands with others and consider head nods or verbal greetings only. . Avoid touching your eyes, nose, or mouth with unwashed hands.  . Avoid close contact with people who are sick. . Avoid places or events with large numbers of people in one location, like concerts or sporting events. . Carefully consider travel plans you have or are making. . If you are planning any travel outside or inside the [place], visit the CDC's Travelers' Health webpage for the latest health notices. . If you have some symptoms but not all symptoms, continue to monitor at home and seek medical attention if your symptoms worsen. . If you are having a medical emergency, call 911.   ADDITIONAL HEALTHCARE OPTIONS FOR PATIENTS  [facility] / e-Visit: [email or web address]         [facility]: [phone]  [facility]: [phone]                   [facility]: [phone]   Prescreened. Neg .cm 

## 2019-04-29 ENCOUNTER — Inpatient Hospital Stay: Payer: Medicaid Other | Admitting: Anesthesiology

## 2019-04-29 ENCOUNTER — Ambulatory Visit (INDEPENDENT_AMBULATORY_CARE_PROVIDER_SITE_OTHER): Payer: Medicaid Other | Admitting: Obstetrics and Gynecology

## 2019-04-29 ENCOUNTER — Encounter: Payer: Self-pay | Admitting: Obstetrics and Gynecology

## 2019-04-29 ENCOUNTER — Other Ambulatory Visit: Payer: Self-pay

## 2019-04-29 ENCOUNTER — Inpatient Hospital Stay
Admission: EM | Admit: 2019-04-29 | Discharge: 2019-05-01 | DRG: 806 | Disposition: A | Discharge status: Home / Self Care | Payer: Medicaid Other | Attending: Obstetrics and Gynecology | Admitting: Obstetrics and Gynecology

## 2019-04-29 ENCOUNTER — Ambulatory Visit (INDEPENDENT_AMBULATORY_CARE_PROVIDER_SITE_OTHER): Payer: Medicaid Other

## 2019-04-29 VITALS — BP 96/78 | HR 80 | Wt 159.2 lb

## 2019-04-29 DIAGNOSIS — O480 Post-term pregnancy: Secondary | ICD-10-CM

## 2019-04-29 DIAGNOSIS — O9081 Anemia of the puerperium: Secondary | ICD-10-CM | POA: Diagnosis not present

## 2019-04-29 DIAGNOSIS — O99344 Other mental disorders complicating childbirth: Secondary | ICD-10-CM | POA: Diagnosis present

## 2019-04-29 DIAGNOSIS — Z3483 Encounter for supervision of other normal pregnancy, third trimester: Secondary | ICD-10-CM

## 2019-04-29 DIAGNOSIS — Z3A40 40 weeks gestation of pregnancy: Secondary | ICD-10-CM

## 2019-04-29 DIAGNOSIS — Z1159 Encounter for screening for other viral diseases: Secondary | ICD-10-CM | POA: Diagnosis not present

## 2019-04-29 DIAGNOSIS — Z87891 Personal history of nicotine dependence: Secondary | ICD-10-CM

## 2019-04-29 DIAGNOSIS — O9832 Other infections with a predominantly sexual mode of transmission complicating childbirth: Secondary | ICD-10-CM | POA: Diagnosis present

## 2019-04-29 DIAGNOSIS — F319 Bipolar disorder, unspecified: Secondary | ICD-10-CM | POA: Diagnosis present

## 2019-04-29 DIAGNOSIS — Z8659 Personal history of other mental and behavioral disorders: Secondary | ICD-10-CM

## 2019-04-29 DIAGNOSIS — Z8619 Personal history of other infectious and parasitic diseases: Secondary | ICD-10-CM

## 2019-04-29 DIAGNOSIS — O99891 Other specified diseases and conditions complicating pregnancy: Secondary | ICD-10-CM

## 2019-04-29 DIAGNOSIS — A6000 Herpesviral infection of urogenital system, unspecified: Secondary | ICD-10-CM | POA: Diagnosis present

## 2019-04-29 DIAGNOSIS — O4202 Full-term premature rupture of membranes, onset of labor within 24 hours of rupture: Secondary | ICD-10-CM | POA: Diagnosis present

## 2019-04-29 DIAGNOSIS — O4290 Premature rupture of membranes, unspecified as to length of time between rupture and onset of labor, unspecified weeks of gestation: Secondary | ICD-10-CM | POA: Diagnosis present

## 2019-04-29 DIAGNOSIS — B009 Herpesviral infection, unspecified: Secondary | ICD-10-CM | POA: Diagnosis not present

## 2019-04-29 DIAGNOSIS — O4200 Premature rupture of membranes, onset of labor within 24 hours of rupture, unspecified weeks of gestation: Secondary | ICD-10-CM | POA: Diagnosis present

## 2019-04-29 LAB — TYPE AND SCREEN
ABO/RH(D): A POS
Antibody Screen: NEGATIVE

## 2019-04-29 LAB — CBC
HCT: 35.9 % — ABNORMAL LOW (ref 36.0–46.0)
Hemoglobin: 12 g/dL (ref 12.0–15.0)
MCH: 31 pg (ref 26.0–34.0)
MCHC: 33.4 g/dL (ref 30.0–36.0)
MCV: 92.8 fL (ref 80.0–100.0)
Platelets: 220 10*3/uL (ref 150–400)
RBC: 3.87 MIL/uL (ref 3.87–5.11)
RDW: 13.5 % (ref 11.5–15.5)
WBC: 20.7 10*3/uL — ABNORMAL HIGH (ref 4.0–10.5)
nRBC: 0 % (ref 0.0–0.2)

## 2019-04-29 LAB — POCT URINALYSIS DIPSTICK OB
Bilirubin, UA: NEGATIVE
Blood, UA: NEGATIVE
Glucose, UA: NEGATIVE
Ketones, UA: NEGATIVE
Leukocytes, UA: NEGATIVE
Nitrite, UA: NEGATIVE
Spec Grav, UA: 1.02 (ref 1.010–1.025)
Urobilinogen, UA: 0.2 E.U./dL
pH, UA: 6.5 (ref 5.0–8.0)

## 2019-04-29 LAB — RAPID HIV SCREEN (HIV 1/2 AB+AG)
HIV 1/2 Antibodies: NONREACTIVE
HIV-1 P24 Antigen - HIV24: NONREACTIVE

## 2019-04-29 LAB — SARS CORONAVIRUS 2 BY RT PCR (HOSPITAL ORDER, PERFORMED IN ~~LOC~~ HOSPITAL LAB): SARS Coronavirus 2: NEGATIVE

## 2019-04-29 MED ORDER — ACETAMINOPHEN 325 MG PO TABS: 650.0000 mg | ORAL_TABLET | ORAL | Status: DC | PRN

## 2019-04-29 MED ORDER — FENTANYL-BUPIVACAINE-NACL 0.5-0.125-0.9 MG/250ML-% EP SOLN: 12.0000 mL/h | EPIDURAL | Status: DC | PRN

## 2019-04-29 MED ORDER — PHENYLEPHRINE 40 MCG/ML (10ML) SYRINGE FOR IV PUSH (FOR BLOOD PRESSURE SUPPORT): 80.0000 ug | PREFILLED_SYRINGE | INTRAVENOUS | Status: DC | PRN

## 2019-04-29 MED ORDER — LIDOCAINE-EPINEPHRINE (PF) 1.5 %-1:200000 IJ SOLN
INTRAMUSCULAR | Status: DC | PRN
  Administered 2019-04-29: 3 mL via EPIDURAL

## 2019-04-29 MED ORDER — OXYTOCIN 10 UNIT/ML IJ SOLN
INTRAMUSCULAR | Status: AC
  Filled 2019-04-29: qty 2

## 2019-04-29 MED ORDER — OXYTOCIN BOLUS FROM INFUSION
500.0000 mL | Freq: Once | INTRAVENOUS | Status: AC
  Administered 2019-04-30: 500 mL via INTRAVENOUS

## 2019-04-29 MED ORDER — EPHEDRINE 5 MG/ML INJ: 10.0000 mg | INTRAVENOUS | Status: DC | PRN

## 2019-04-29 MED ORDER — LACTATED RINGERS IV SOLN
500.0000 mL | Freq: Once | INTRAVENOUS | Status: AC
  Administered 2019-04-29: 500 mL via INTRAVENOUS

## 2019-04-29 MED ORDER — AMMONIA AROMATIC IN INHA
RESPIRATORY_TRACT | Status: AC
  Filled 2019-04-29: qty 10

## 2019-04-29 MED ORDER — FENTANYL 2.5 MCG/ML W/ROPIVACAINE 0.15% IN NS 100 ML EPIDURAL (ARMC)
EPIDURAL | Status: AC
  Filled 2019-04-29: qty 100

## 2019-04-29 MED ORDER — DIPHENHYDRAMINE HCL 50 MG/ML IJ SOLN: 12.5000 mg | INTRAMUSCULAR | Status: DC | PRN

## 2019-04-29 MED ORDER — LIDOCAINE HCL (PF) 1 % IJ SOLN
30.0000 mL | INTRAMUSCULAR | Status: AC | PRN
  Administered 2019-04-29: 1.2 mL via SUBCUTANEOUS

## 2019-04-29 MED ORDER — ONDANSETRON HCL 4 MG/2ML IJ SOLN: 4.0000 mg | Freq: Four times a day (QID) | INTRAMUSCULAR | Status: DC | PRN

## 2019-04-29 MED ORDER — FENTANYL 2.5 MCG/ML W/ROPIVACAINE 0.15% IN NS 100 ML EPIDURAL (ARMC)
EPIDURAL | Status: DC | PRN
  Administered 2019-04-29: 12 mL/h via EPIDURAL

## 2019-04-29 MED ORDER — OXYCODONE-ACETAMINOPHEN 5-325 MG PO TABS: 1.0000 | ORAL_TABLET | ORAL | Status: DC | PRN

## 2019-04-29 MED ORDER — MISOPROSTOL 200 MCG PO TABS
ORAL_TABLET | ORAL | Status: AC
  Filled 2019-04-29: qty 4

## 2019-04-29 MED ORDER — OXYTOCIN 40 UNITS IN NORMAL SALINE INFUSION - SIMPLE MED
2.5000 [IU]/h | INTRAVENOUS | Status: DC
  Filled 2019-04-29: qty 1000

## 2019-04-29 MED ORDER — LIDOCAINE HCL (PF) 1 % IJ SOLN
INTRAMUSCULAR | Status: AC
  Filled 2019-04-29: qty 30

## 2019-04-29 MED ORDER — LACTATED RINGERS IV SOLN
INTRAVENOUS | Status: DC
  Administered 2019-04-29: 17:00:00 via INTRAVENOUS

## 2019-04-29 MED ORDER — SOD CITRATE-CITRIC ACID 500-334 MG/5ML PO SOLN: 30.0000 mL | ORAL | Status: DC | PRN

## 2019-04-29 MED ORDER — LACTATED RINGERS IV SOLN: 500.0000 mL | INTRAVENOUS | Status: DC | PRN

## 2019-04-29 MED ORDER — BUTORPHANOL TARTRATE 2 MG/ML IJ SOLN: 1.0000 mg | INTRAMUSCULAR | Status: DC | PRN

## 2019-04-29 MED ORDER — OXYCODONE-ACETAMINOPHEN 5-325 MG PO TABS: 2.0000 | ORAL_TABLET | ORAL | Status: DC | PRN

## 2019-04-29 NOTE — Progress Notes (Signed)
Intrapartum Progress Note  S: Patient comfortable. Is s/p epidural placement.   O: Blood pressure 112/79, pulse 94, temperature 97.7 F (36.5 C), temperature source Oral, resp. rate 16, height 5\' 2"  (1.575 m), weight 72.1 kg, last menstrual period 07/24/2018, SpO2 99 %. Gen App: NAD, comfortable Abdomen: soft, gravid FHT: baseline 140 bpm.  Accels present.  Decels absent. moderate in degree variability.   Tocometer: contractions q 1-2 minutes Cervix: 8/90/0/PROM Extremities: Nontender, no edema.  Pitocin: None  Labs:  Results for orders placed or performed during the hospital encounter of 04/29/19  SARS Coronavirus 2 (CEPHEID - Performed in [place] hospital lab), [facility] Order   Specimen: Nasopharyngeal Swab  Result Value Ref Range   SARS Coronavirus 2 NEGATIVE NEGATIVE  CBC  Result Value Ref Range   WBC 20.7 (H) 4.0 - 10.5 K/uL   RBC 3.87 3.87 - 5.11 MIL/uL   Hemoglobin 12.0 12.0 - 15.0 g/dL   HCT 35.9 (L) 36.0 - 46.0 %   MCV 92.8 80.0 - 100.0 fL   MCH 31.0 26.0 - 34.0 pg   MCHC 33.4 30.0 - 36.0 g/dL   RDW 13.5 11.5 - 15.5 %   Platelets 220 150 - 400 K/uL   nRBC 0.0 0.0 - 0.2 %  Rapid HIV screen (HIV 1/2 Ab+Ag) ([facility] Only)  Result Value Ref Range   HIV-1 P24 Antigen - HIV24 NON REACTIVE NON REACTIVE   HIV 1/2 Antibodies NON REACTIVE NON REACTIVE   Interpretation (HIV Ag Ab)      A non reactive test result means that HIV 1 or HIV 2 antibodies and HIV 1 p24 antigen were not detected in the specimen.  Type and screen [place]  Result Value Ref Range   ABO/RH(D) A POS    Antibody Screen NEG    Sample Expiration      05/02/2019,2359 Performed at [facility], [address] [place], [place] [address]      Assessment:  1: SIUP at [redacted]w[redacted]d 2. PROM with onset of labor 3. GBS neg.  Plan:  1. Continue expectant management 2. Anticipate vaginal delivery soon   Rubie Maid, MD 04/29/2019 9:50 PM

## 2019-04-29 NOTE — Anesthesia Procedure Notes (Signed)
Epidural Patient location during procedure: OB Start time: 04/29/2019 7:01 PM End time: 04/29/2019 7:40 PM  Staffing Performed: anesthesiologist   Preanesthetic Checklist Completed: patient identified, site marked, surgical consent, pre-op evaluation, timeout performed, IV checked, risks and benefits discussed and monitors and equipment checked  Epidural Patient position: sitting Prep: Betadine Patient monitoring: heart rate, continuous pulse ox and blood pressure Approach: midline Location: L4-L5 Injection technique: LOR saline  Needle:  Needle type: Tuohy  Needle gauge: 17 G Needle length: 9 cm and 9 Needle insertion depth: 6 cm Catheter type: closed end flexible Catheter size: 20 Guage Catheter at skin depth: 9 cm Test dose: negative and 1.5% lidocaine with Epi 1:200 K  Assessment Events: blood not aspirated, injection not painful, no injection resistance, negative IV test and no paresthesia  Additional Notes   Patient tolerated the insertion well without complications.Reason for block:procedure for pain

## 2019-04-29 NOTE — Progress Notes (Signed)
ROB-PT present for prenatal care and BPP u/s. Pt stated noticing bright red blood in mucus discharge and noticing a lot of vaginal pain and contractions.

## 2019-04-29 NOTE — Progress Notes (Signed)
ROB: Patient complains of bright red blood/mucus discharge and vaginal pain. Has been having some irregular contractions. Discussed labor precautions. Desires membrane stripping. Will perform at patients request. Discussed labor precautions. Normal BPP today with AFI 15 cm. Return Tuesday if undelivered. To schedule IOL at 41 weeks.

## 2019-04-29 NOTE — H&P (Signed)
Obstetric History and Physical  Clois DupesMakayla H Mcclane is a 23 y.o. [id number] with IUP at 5137w0d presenting for complaints of ruptured membranes for ~ 1 hour. Patient states she has been having  Irregular every 3-6 minute contractions, minimal vaginal bleeding, ruptured, clear fluid membranes, with active fetal movement.  Of note, patient was seen in the office earlier today.  Desired membrane stripping at visit.  Patient has a h/o HSV, has been on suppression.   Prenatal Course Source of Care: [facility]  with onset of care at 8 weeks Pregnancy complications or risks: Patient Active Problem List   Diagnosis Date Noted  . PROM with onset of labor within 24 hours of rupture 04/29/2019  . HSV infection 09/09/2018  . History of postpartum depression, currently pregnant 09/09/2018  . Bipolar 1 disorder (HCC) 09/10/2017  . Depression, postpartum 07/20/2017   She plans to breastfeed She desires undecided method for postpartum contraception.   Prenatal labs and studies: ABO, Rh: A/Positive/-- (11/27 1454) Antibody: Negative (11/27 1454) Rubella: 1.34 (11/27 1454) RPR: Non Reactive (04/02 0925)  HBsAg: Negative (11/27 1454)  HIV: Non Reactive (11/27 1454)  AVW:UJWJXBJYGBS:Negative (06/02 1542) 1 hr Glucola  normal Genetic screening normal Anatomy US normal   Past Medical History:  Diagnosis Date  . H/O postpartum depression, currently pregnant   . Herpes genitalis in women     Past Surgical History:  Procedure Laterality Date  . CERVICAL BIOPSY    . no surgical history      OB History  Gravida Para Term Preterm AB Living  3 2 2     2   SAB TAB Ectopic Multiple Live Births        0 2    # Outcome Date GA Lbr Len/2nd Weight Sex Delivery Anes PTL Lv  3 Current           2 Term 05/29/16 6037w0d 22:15 / 00:28 3550 g M Vag-Spont EPI  LIV  1 Term 07/27/13 1037w0d  3572 g M Vag-Spont   LIV    Social History   Socioeconomic History  . Marital status: Married    Spouse name: Not on file  .  Number of children: Not on file  . Years of education: Not on file  . Highest education level: Not on file  Occupational History  . Not on file  Social Needs  . Financial resource strain: Not hard at all  . Food insecurity    Worry: Never true    Inability: Never true  . Transportation needs    Medical: No    Non-medical: No  Tobacco Use  . Smoking status: Former Games developermoker  . Smokeless tobacco: Never Used  Substance and Sexual Activity  . Alcohol use: Not Currently    Alcohol/week: 0.0 standard drinks    Comment: occasionally  . Drug use: No  . Sexual activity: Yes    Birth control/protection: None  Lifestyle  . Physical activity    Days per week: 0 days    Minutes per session: 0 min  . Stress: Not at all  Relationships  . Social connections    Talks on phone: More than three times a week    Gets together: More than three times a week    Attends religious service: Never    Active member of club or organization: No    Attends meetings of clubs or organizations: Never    Relationship status: Married  Other Topics Concern  . Not on file  Social  History Narrative  . Not on file    Family History  Problem Relation Age of Onset  . Healthy Mother   . Healthy Father   . Lupus Maternal Aunt   . Pancreatic cancer Maternal Uncle   . Stroke Maternal Grandfather   . Kidney cancer Neg Hx     Medications Prior to Admission  Medication Sig Dispense Refill Last Dose  . acyclovir (ZOVIRAX) 400 MG tablet TAKE 1 TABLET BY MOUTH TWICE A DAY 60 tablet 11 04/28/2019 at Unknown time  . Prenat w/o A FeCbnFeGlu-FA &B6 (CITRANATAL B-CALM) 20-1 MG & 2 x 25 MG MISC Take 1 tablet by mouth daily. 30 each 11 04/28/2019 at Unknown time  . sertraline (ZOLOFT) 100 MG tablet Take 1 tablet (100 mg total) by mouth daily. 30 tablet 1 04/28/2019 at Unknown time  . Doxylamine-Pyridoxine 10-10 MG TBEC Take 10 mg by mouth 2 (two) times daily. (Patient not taking: Reported on 04/29/2019) 60 tablet 1 Not Taking at  Unknown time    No Known Allergies  Review of Systems: Negative except for what is mentioned in HPI.  Physical Exam: BP 123/88 (BP Location: Right Arm)   Pulse (!) 111   Temp 97.7 F (36.5 C) (Oral)   Resp 16   Ht 5\' 2"  (1.575 m)   Wt 72.1 kg   LMP 07/24/2018   BMI 29.08 kg/m  CONSTITUTIONAL: Well-developed, well-nourished female in no acute distress.  HENT:  Normocephalic, atraumatic, External right and left ear normal. Oropharynx is clear and moist EYES: Conjunctivae and EOM are normal. Pupils are equal, round, and reactive to light. No scleral icterus.  NECK: Normal range of motion, supple, no masses SKIN: Skin is warm and dry. No rash noted. Not diaphoretic. No erythema. No pallor. NEUROLOGIC: Alert and oriented to person, place, and time. Normal reflexes, muscle tone coordination. No cranial nerve deficit noted. PSYCHIATRIC: Normal mood and affect. Normal behavior. Normal judgment and thought content. CARDIOVASCULAR: Normal heart rate noted, regular rhythm RESPIRATORY: Effort and breath sounds normal, no problems with respiration noted ABDOMEN: Soft, nontender, nondistended, gravid. MUSCULOSKELETAL: Normal range of motion. No edema and no tenderness. 2+ distal pulses.  Cervical Exam: Dilatation 4.5 cm   Effacement 90%   Station -2   Presentation: cephalic FHT:  Baseline rate 140 bpm   Variability moderate  Accelerations present   Decelerations none Contractions: Every 1-2 mins   Pertinent Labs/Studies:   Results for orders placed or performed in visit on 04/29/19 (from the past 24 hour(s))  POC Urinalysis Dipstick OB     Status: None   Collection Time: 04/29/19  2:54 PM  Result Value Ref Range   Color, UA yellow    Clarity, UA clear    Glucose, UA Negative Negative   Bilirubin, UA neg    Ketones, UA neg    Spec Grav, UA 1.020 1.010 - 1.025   Blood, UA neg    pH, UA 6.5 5.0 - 8.0   POC,PROTEIN,UA Trace Negative, Trace, Small (1+), Moderate (2+), Large (3+), 4+    Urobilinogen, UA 0.2 0.2 or 1.0 E.U./dL   Nitrite, UA neg    Leukocytes, UA Negative Negative   Appearance yellow    Odor      Assessment : Betrice H Coots is a 23 y.o. [id number] at [redacted]w[redacted]d being admitted for PROM with onset of labor.  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 H/o postpartum depression, bipolar disorder.   Plan: Labor: Expectant management. Augmentation as ordered as  per protocol. Analgesia as needed. Patient desires epidural.  Admission labs ordered.  FWB: Reassuring fetal heart tracing.  GBS negative Delivery plan: Hopeful for vaginal delivery     Hildred Laserherry, Ramondo Dietze, MD [facility]

## 2019-04-29 NOTE — OB Triage Note (Signed)
Pt G3P2 complains of LOF since 1500 today. She said it happened shortly after her membranes were stripped in the office. Pt states she started contracting after that and that she is currently contracting q37min. She reports + FM. VSS. Monitors applied and assessing.

## 2019-04-29 NOTE — Anesthesia Preprocedure Evaluation (Signed)
Anesthesia Evaluation  Patient identified by MRN, date of birth, ID band Patient awake    Reviewed: Allergy & Precautions, NPO status , Patient's Chart, lab work & pertinent test results  History of Anesthesia Complications Negative for: history of anesthetic complications  Airway Mallampati: II       Dental   Pulmonary neg sleep apnea, neg COPD, former smoker,           Cardiovascular (-) hypertension(-) Past MI and (-) CHF (-) dysrhythmias (-) Valvular Problems/Murmurs     Neuro/Psych neg Seizures Depression Bipolar Disorder    GI/Hepatic Neg liver ROS, GERD (with pregnancy)  ,  Endo/Other  neg diabetes  Renal/GU negative Renal ROS     Musculoskeletal   Abdominal   Peds  Hematology   Anesthesia Other Findings   Reproductive/Obstetrics (+) Pregnancy                             Anesthesia Physical Anesthesia Plan  ASA: II  Anesthesia Plan: Epidural   Post-op Pain Management:    Induction:   PONV Risk Score and Plan:   Airway Management Planned:   Additional Equipment:   Intra-op Plan:   Post-operative Plan:   Informed Consent: I have reviewed the patients History and Physical, chart, labs and discussed the procedure including the risks, benefits and alternatives for the proposed anesthesia with the patient or authorized representative who has indicated his/her understanding and acceptance.       Plan Discussed with:   Anesthesia Plan Comments:         Anesthesia Quick Evaluation

## 2019-04-30 ENCOUNTER — Encounter: Payer: Self-pay | Admitting: *Deleted

## 2019-04-30 DIAGNOSIS — Z8659 Personal history of other mental and behavioral disorders: Secondary | ICD-10-CM

## 2019-04-30 DIAGNOSIS — B009 Herpesviral infection, unspecified: Secondary | ICD-10-CM

## 2019-04-30 DIAGNOSIS — O4202 Full-term premature rupture of membranes, onset of labor within 24 hours of rupture: Principal | ICD-10-CM

## 2019-04-30 DIAGNOSIS — O9832 Other infections with a predominantly sexual mode of transmission complicating childbirth: Secondary | ICD-10-CM

## 2019-04-30 MED ORDER — WITCH HAZEL-GLYCERIN EX PADS: 1.0000 "application " | MEDICATED_PAD | CUTANEOUS | Status: DC | PRN

## 2019-04-30 MED ORDER — ONDANSETRON HCL 4 MG/2ML IJ SOLN: 4.0000 mg | INTRAMUSCULAR | Status: DC | PRN

## 2019-04-30 MED ORDER — DIPHENHYDRAMINE HCL 25 MG PO CAPS: 25.0000 mg | ORAL_CAPSULE | Freq: Four times a day (QID) | ORAL | Status: DC | PRN

## 2019-04-30 MED ORDER — ZOLPIDEM TARTRATE 5 MG PO TABS: 5.0000 mg | ORAL_TABLET | Freq: Every evening | ORAL | Status: DC | PRN

## 2019-04-30 MED ORDER — TERBUTALINE SULFATE 1 MG/ML IJ SOLN: 0.2500 mg | Freq: Once | INTRAMUSCULAR | Status: DC | PRN

## 2019-04-30 MED ORDER — SENNOSIDES-DOCUSATE SODIUM 8.6-50 MG PO TABS
2.0000 | ORAL_TABLET | ORAL | Status: DC
  Administered 2019-05-01: 2 via ORAL
  Filled 2019-04-30: qty 2

## 2019-04-30 MED ORDER — ACETAMINOPHEN 325 MG PO TABS
650.0000 mg | ORAL_TABLET | ORAL | Status: DC | PRN
  Administered 2019-05-01: 650 mg via ORAL
  Filled 2019-04-30: qty 2

## 2019-04-30 MED ORDER — SERTRALINE HCL 100 MG PO TABS
100.0000 mg | ORAL_TABLET | Freq: Every day | ORAL | Status: DC
  Administered 2019-04-30 – 2019-05-01 (×2): 100 mg via ORAL
  Filled 2019-04-30 (×2): qty 1

## 2019-04-30 MED ORDER — OXYTOCIN 40 UNITS IN NORMAL SALINE INFUSION - SIMPLE MED
1.0000 m[IU]/min | INTRAVENOUS | Status: DC
  Administered 2019-04-30: 2 m[IU]/min via INTRAVENOUS

## 2019-04-30 MED ORDER — IBUPROFEN 800 MG PO TABS
800.0000 mg | ORAL_TABLET | Freq: Four times a day (QID) | ORAL | Status: DC
  Administered 2019-04-30: 800 mg via ORAL
  Filled 2019-04-30: qty 1

## 2019-04-30 MED ORDER — IBUPROFEN 800 MG PO TABS
800.0000 mg | ORAL_TABLET | Freq: Four times a day (QID) | ORAL | Status: DC
  Administered 2019-04-30 – 2019-05-01 (×4): 800 mg via ORAL
  Filled 2019-04-30 (×4): qty 1

## 2019-04-30 MED ORDER — COCONUT OIL OIL: 1.0000 "application " | TOPICAL_OIL | Status: DC | PRN

## 2019-04-30 MED ORDER — DIBUCAINE (PERIANAL) 1 % EX OINT: 1.0000 "application " | TOPICAL_OINTMENT | CUTANEOUS | Status: DC | PRN

## 2019-04-30 MED ORDER — ONDANSETRON HCL 4 MG PO TABS: 4.0000 mg | ORAL_TABLET | ORAL | Status: DC | PRN

## 2019-04-30 MED ORDER — FENTANYL 2.5 MCG/ML W/ROPIVACAINE 0.15% IN NS 100 ML EPIDURAL (ARMC)
EPIDURAL | Status: AC
  Administered 2019-04-30: 03:00:00
  Filled 2019-04-30: qty 100

## 2019-04-30 MED ORDER — PRENATAL MULTIVITAMIN CH
1.0000 | ORAL_TABLET | Freq: Every day | ORAL | Status: DC
  Administered 2019-04-30: 1 via ORAL
  Filled 2019-04-30: qty 1

## 2019-04-30 MED ORDER — BENZOCAINE-MENTHOL 20-0.5 % EX AERO: 1.0000 "application " | INHALATION_SPRAY | CUTANEOUS | Status: DC | PRN

## 2019-04-30 MED ORDER — SIMETHICONE 80 MG PO CHEW: 80.0000 mg | CHEWABLE_TABLET | ORAL | Status: DC | PRN

## 2019-04-30 NOTE — Anesthesia Postprocedure Evaluation (Signed)
Anesthesia Post Note  Patient: Chelsea Parks  Procedure(s) Performed: AN AD HOC LABOR EPIDURAL  Patient location during evaluation: Mother Baby Anesthesia Type: Epidural Level of consciousness: awake and alert Pain management: pain level controlled Respiratory status: spontaneous breathing, nonlabored ventilation and respiratory function stable Cardiovascular status: stable Postop Assessment: no headache, no backache and epidural receding Anesthetic complications: no     Last Vitals:  Vitals:   04/30/19 1231 04/30/19 1552  BP: 106/79 (!) 99/59  Pulse: 91 92  Resp: 18 18  Temp: 36.5 C 36.8 C  SpO2: 99% 100%    Last Pain:  Vitals:   04/30/19 1552  TempSrc: Oral  PainSc:                  Durenda Hurt

## 2019-05-01 DIAGNOSIS — O9081 Anemia of the puerperium: Secondary | ICD-10-CM

## 2019-05-01 LAB — CBC
HCT: 29.1 % — ABNORMAL LOW (ref 36.0–46.0)
Hemoglobin: 9.4 g/dL — ABNORMAL LOW (ref 12.0–15.0)
MCH: 31.6 pg (ref 26.0–34.0)
MCHC: 32.3 g/dL (ref 30.0–36.0)
MCV: 98 fL (ref 80.0–100.0)
Platelets: 156 10*3/uL (ref 150–400)
RBC: 2.97 MIL/uL — ABNORMAL LOW (ref 3.87–5.11)
RDW: 14 % (ref 11.5–15.5)
WBC: 10.9 10*3/uL — ABNORMAL HIGH (ref 4.0–10.5)
nRBC: 0 % (ref 0.0–0.2)

## 2019-05-01 LAB — RPR: RPR Ser Ql: NONREACTIVE

## 2019-05-01 MED ORDER — IBUPROFEN 800 MG PO TABS: 800.0000 mg | ORAL_TABLET | Freq: Three times a day (TID) | ORAL | 1 refills | Status: DC | PRN

## 2019-05-01 NOTE — Discharge Instructions (Signed)

## 2019-05-01 NOTE — Discharge Summary (Signed)
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 yo G3P3003 at [redacted]w[redacted]d was admitted in Active Labor on 04/29/2019. Patient had an uncomplicated labor course as follows:  Membrane Rupture Time/Date:  ~4:15 pm, 04/29/2019  Intrapartum Procedures: Episiotomy: None [1]                                         Lacerations:  None [1]  Patient had a delivery of a Viable infant. 04/30/2019  Information for the patient's newborn:  Aubri, [place] Girl [place] [[id number]]  Delivery Method: Vag-Spont   Delivery complicated by mild shoulder dystocia, relieved with suprapubic pressure and McRobert's maneuver  Pateint had an uncomplicated postpartum course.  She is ambulating, tolerating a regular diet, passing flatus, and urinating well. Patient is discharged home in stable condition on 05/01/19.   Physical exam  Vitals:   04/30/19 1552 04/30/19 1948 05/01/19 0016 05/01/19 0756  BP: (!) 99/59 110/73 115/69 106/80  Pulse: 92 93 90 88  Resp: 18   20  Temp: 98.3 F (36.8 C) 97.8 F (36.6 C) 98.5 F (36.9 C) 98.4 F (36.9 C)  TempSrc: Oral Oral Oral Oral  SpO2: 100% 100% 100% 100%  Weight:      Height:       General: alert and no distress Lochia:  appropriate Uterine Fundus: firm Incision: N/A DVT Evaluation: No evidence of DVT seen on physical exam. Labs: Lab Results  Component Value Date   WBC 10.9 (H) 05/01/2019   HGB 9.4 (L) 05/01/2019   HCT 29.1 (L) 05/01/2019   MCV 98.0 05/01/2019   PLT 156 05/01/2019   CMP Latest Ref Rng & Units 05/21/2018  Glucose 65 - 99 mg/dL 91  BUN 6 - 20 mg/dL 8  Creatinine 0.57 - 1.00 mg/dL 0.76  Sodium 134 - 144 mmol/L 139  Potassium 3.5 - 5.2 mmol/L 4.0  Chloride 96 - 106 mmol/L 104  CO2 20 - 29 mmol/L 21  Calcium 8.7 - 10.2 mg/dL 9.2  Total Protein 6.0 - 8.5 g/dL 6.9  Total Bilirubin 0.0 - 1.2 mg/dL 0.4  Alkaline Phos 39 - 117 IU/L 90  AST 0 - 40 IU/L 15  ALT 0 - 32 IU/L 9    Discharge instruction: per After Visit Summary and "Baby and Me Booklet".  After visit meds:  Allergies as of 05/01/2019   No Known Allergies     Medication List    STOP taking these medications   acyclovir 400 MG tablet Commonly known as: ZOVIRAX   Doxylamine-Pyridoxine 10-10 MG Tbec     TAKE these medications   CitraNatal B-Calm 20-1 MG & 2 x 25 MG Misc Take 1 tablet by mouth daily.   ibuprofen 800 MG tablet Commonly known as: ADVIL Take 1 tablet (800 mg total) by mouth every 8 (eight) hours as needed.   sertraline 100 MG tablet Commonly known as: ZOLOFT Take 1 tablet (100 mg total) by mouth daily.       Diet: routine diet  Activity: Advance as tolerated. Pelvic rest for 6 weeks.   Outpatient follow up:2 weeks Follow up Appt:No future appointments. Follow up Visit:No follow-ups on file.  Postpartum contraception: Vasectomy  Newborn Data: Live born female  Birth Weight: 8 lb 12.7 oz (3990 g) APGAR: 8, 9  Newborn Delivery   Birth date/time: 04/30/2019 03:04:00 Delivery type: Vaginal, Spontaneous      Baby Feeding: Breast Disposition:home with mother   05/01/2019 Hildred LaserAnika Renny Gunnarson, MD

## 2019-05-01 NOTE — Progress Notes (Signed)
Post Partum Day # 1, s/p SVD  Subjective: no complaints, up ad lib, voiding and tolerating PO  Objective: Temp:  [97.7 F (36.5 C)-98.5 F (36.9 C)] 98.4 F (36.9 C) (07/04 0756) Pulse Rate:  [88-93] 88 (07/04 0756) Resp:  [18-20] 20 (07/04 0756) BP: (99-115)/(59-80) 106/80 (07/04 0756) SpO2:  [99 %-100 %] 100 % (07/04 0756)  Physical Exam:  General: alert and no distress  Lungs: clear to auscultation bilaterally Breasts: normal appearance, no masses or tenderness Heart: regular rate and rhythm, S1, S2 normal, no murmur, click, rub or gallop Abdomen: soft, non-tender; bowel sounds normal; no masses,  no organomegaly Pelvis: Lochia: appropriate, Uterine Fundus: firm Extremities: DVT Evaluation: No evidence of DVT seen on physical exam. Negative Homan's sign. No cords or calf tenderness. No significant calf/ankle edema.  Recent Labs    04/29/19 1824 05/01/19 0550  HGB 12.0 9.4*  HCT 35.9* 29.1*    Assessment/Plan: Doing well postpartum Breastfeeding going well Contraception vasectomy Mild anemia postpartum, can take PO iron supplementation, asymptomatic.  Discharge home today.   LOS: 2 days   Rubie Maid, MD [facility] 05/01/2019 8:40 AM

## 2019-05-01 NOTE — Progress Notes (Signed)
Discharge instructions given and prescription sent to pharmacy by provider. Patient verbalizes understanding of teaching. Patient discharged home via wheelchair at 1215. 

## 2019-05-18 ENCOUNTER — Encounter: Payer: Self-pay | Admitting: Obstetrics and Gynecology

## 2019-05-18 ENCOUNTER — Encounter: Payer: Medicaid Other | Admitting: Obstetrics and Gynecology

## 2019-05-18 ENCOUNTER — Other Ambulatory Visit: Payer: Self-pay

## 2019-05-18 ENCOUNTER — Ambulatory Visit (INDEPENDENT_AMBULATORY_CARE_PROVIDER_SITE_OTHER): Payer: Medicaid Other | Admitting: Obstetrics and Gynecology

## 2019-05-18 ENCOUNTER — Telehealth: Payer: Self-pay

## 2019-05-18 VITALS — BP 118/78 | HR 92 | Ht 62.0 in | Wt 145.9 lb

## 2019-05-18 DIAGNOSIS — O99345 Other mental disorders complicating the puerperium: Secondary | ICD-10-CM | POA: Diagnosis not present

## 2019-05-18 DIAGNOSIS — F319 Bipolar disorder, unspecified: Secondary | ICD-10-CM

## 2019-05-18 DIAGNOSIS — F53 Postpartum depression: Secondary | ICD-10-CM

## 2019-05-18 MED ORDER — LAMOTRIGINE 25 MG PO TABS: 25.0000 mg | ORAL_TABLET | Freq: Every day | ORAL | 2 refills | Status: DC

## 2019-05-18 NOTE — Telephone Encounter (Signed)
Pt called to reschedule her missed appointment today. No answer LMTRC to reschedule her appointment.

## 2019-05-18 NOTE — Progress Notes (Signed)
Pt is present for post partum depression. Pt was started on Zoloft 100 mg at her last visit. Pt stated that she has been taking the medication as prescribed and think that the medication is helping her, but feels like she needs something else to help with her anxiety. EPDS=12. GAD-7= 21.

## 2019-05-18 NOTE — Progress Notes (Deleted)
    GYNECOLOGY PROGRESS NOTE  Subjective:    Patient ID: EVELISSE SZALKOWSKI, female    DOB: September 25, 1996, 23 y.o.   MRN: [id number]  HPI  Patient is a 23 y.o. G80P3003 female who presents for 2 week postpartum follow up. She has a history of bipolar disorder, and postpartum depression in her previous pregnancy.    {Common ambulatory SmartLinks:19316}  Review of Systems {ros; complete:30496}   Objective:   unknown if currently breastfeeding. General appearance: {general exam:16600} Abdomen: {abdominal exam:16834} Pelvic: deferred Psychologic: Neurologic: {neuro exam:17854}   Assessment:    Plan:

## 2019-05-19 NOTE — Patient Instructions (Signed)
Bipolar 1 Disorder Bipolar 1 disorder is a mental health disorder in which a person has episodes of emotional highs (mania), and may also have episodes of emotional lows (depression) in addition to highs. Bipolar 1 disorder is different from other bipolar disorders because it involves extreme manic episodes. These episodes last at least one week or involve symptoms that are so severe that hospitalization is needed to keep the person safe. What increases the risk? The cause of this condition is not known. However, certain factors make you more likely to have bipolar disorder, such as:  Having a family member with the disorder.  An imbalance of certain chemicals in the brain (neurotransmitters).  Stress, such as illness, financial problems, or a death.  Certain conditions that affect the brain or spinal cord (neurologic conditions).  Brain injury (trauma).  Having another mental health disorder, such as: ? Obsessive compulsive disorder. ? Schizophrenia. What are the signs or symptoms? Symptoms of mania include:  Very high self-esteem or self-confidence.  Decreased need for sleep.  Unusual talkativeness or feeling a need to keep talking. Speech may be very fast. It may seem like you cannot stop talking.  Racing thoughts or constant talking, with quick shifts between topics that may or may not be related (flight of ideas).  Decreased ability to focus or concentrate.  Increased purposeful activity, such as work, studies, or social activity.  Increased nonproductive activity. This could be pacing, squirming and fidgeting, or finger and toe tapping.  Impulsive behavior and poor judgment. This may result in high-risk activities, such as having unprotected sex or spending a lot of money. Symptoms of depression include:  Feeling sad, hopeless, or helpless.  Frequent or uncontrollable crying.  Lack of feeling or caring about anything.  Sleeping too much.  Moving more slowly than  usual.  Not being able to enjoy things you used to enjoy.  Wanting to be alone all the time.  Feeling guilty or worthless.  Lack of energy or motivation.  Trouble concentrating or remembering.  Trouble making decisions.  Increased appetite.  Thoughts of death, or the desire to harm yourself. Sometimes, you may have a mixed mood. This means having symptoms of depression and mania. Stress can make symptoms worse. How is this diagnosed? To diagnose bipolar disorder, your health care provider may ask about your:  Emotional episodes.  Medical history.  Alcohol and drug use. This includes prescription medicines. Certain medical conditions and substances can cause symptoms that seem like bipolar disorder (secondary bipolar disorder). How is this treated? Bipolar disorder is a long-term (chronic) illness. It is best controlled with ongoing (continuous) treatment rather than treatment only when symptoms occur. Treatment may include:  Medicine. Medicine can be prescribed by a provider who specializes in treating mental disorders (psychiatrist). ? Medicines called mood stabilizers are usually prescribed. ? If symptoms occur even while taking a mood stabilizer, other medicines may be added.  Psychotherapy. Some forms of talk therapy, such as cognitive-behavioral therapy (CBT), can provide support, education, and guidance.  Coping methods, such as journaling or relaxation exercises. These may include: ? Yoga. ? Meditation. ? Deep breathing.  Lifestyle changes, such as: ? Limiting alcohol and drug use. ? Exercising regularly. ? Getting plenty of sleep. ? Making healthy eating choices.  A combination of medicine, talk therapy, and coping methods is best. A procedure in which electricity is applied to the brain through the scalp (electroconvulsive therapy) may be used in cases of severe mania when medicine and psychotherapy work too   slowly or do not work. Follow these instructions at  home: Activity   Return to your normal activities as told by your health care provider.  Find activities that you enjoy, and make time to do them.  Exercise regularly as told by your health care provider. Lifestyle  Limit alcohol intake to no more than 1 drink a day for nonpregnant women and 2 drinks a day for men. One drink equals 12 oz of beer, 5 oz of wine, or 1 oz of hard liquor.  Follow a set schedule for eating and sleeping.  Eat a balanced diet that includes fresh fruits and vegetables, whole grains, low-fat dairy, and lean meat.  Get 7-8 hours of sleep each night. General instructions  Take over-the-counter and prescription medicines only as told by your health care provider.  Think about joining a support group. Your health care provider may be able to recommend a support group.  Talk with your family and loved ones about your treatment goals and how they can help.  Keep all follow-up visits as told by your health care provider. This is important. Where to find more information For more information about bipolar disorder, visit the following websites:  National Alliance on Mental Illness: www.nami.org  U.S. National Institute of Mental Health: www.nimh.nih.gov Contact a health care provider if:  Your symptoms get worse.  You have side effects from your medicine, and they get worse.  You have trouble sleeping.  You have trouble doing daily activities.  You feel unsafe in your surroundings.  You are dealing with substance abuse. Get help right away if:  You have new symptoms.  You have thoughts about harming yourself.  You self-harm. This information is not intended to replace advice given to you by your health care provider. Make sure you discuss any questions you have with your health care provider. Document Released: 01/20/2001 Document Revised: 09/26/2017 Document Reviewed: 06/13/2016 Elsevier Patient Education  2020 Elsevier Inc.  

## 2019-05-19 NOTE — Progress Notes (Signed)
    GYNECOLOGY CLINIC PROGRESS NOTE  Subjective:    Patient ID: Chelsea Parks, female    DOB: [birth year], 23 y.o.   MRN: [id number]  HPI  Patient is a 23 y.o. G64P3003 female who is  2.5 weeks postpartum s/p NSVD who presents for complaints of anxiety and postpartum depression. Patient has a history of severe postpartum depression after her last pregnancy, also with a history of bipolar disorder (requiring hospitalization).  She was started on Zoloft at discharge after delivery (100 mg).  She notes that she feels that the medication is beginning to help her with regards to her depression, but is noting increased anxiety. States she cleans the house excessively, worries about everything her baby does, is fearful of "something happening" to her or the baby, and feels that she cannot sit still (fidgety, agitated). EPDS=12. GAD-7= 21.  The following portions of the patient's history were reviewed and updated as appropriate: allergies, current medications, past family history, past medical history, past social history, past surgical history and problem list.  Review of Systems Pertinent items noted in HPI and remainder of comprehensive ROS otherwise negative.   Objective:   Blood pressure 118/78, pulse 92, height 5\' 2"  (1.575 m), weight 145 lb 14.4 oz (66.2 kg), not currently breastfeeding. General appearance: alert and no distress Psych: depressed mood, anxious, normal speech, normal affect.    Assessment:   Postpartum depression Bipolar 1 disorder   Plan:   - Patient to continue use of Zoloft at this time as she feels that it is managing her depression. Discussion had on options for managing her anxiety, which may actually be an episode of mania.  Patient was on Seroquel and Lithium after hospitalization for bipolar disorder prior to current pregnancy, but discontinued shortly therafter due to inability to maintain follow up (cost) and distance [facility] clinic).  She also notes that she really  did not like the way her medications made her feel.  Discussed option of starting Lamicatal to balance her bipolar disorder management. Also discussed that patient can follow up with [facility], or we can offer other services within the community to help continue management.  - RTC in 2 weeks for follow up.    A total of 15 minutes were spent face-to-face with the patient during this encounter and over half of that time dealt with counseling and coordination of care.   Rubie Maid, MD [facility]

## 2019-05-21 ENCOUNTER — Telehealth: Payer: Medicaid Other | Admitting: Family

## 2019-05-21 DIAGNOSIS — R300 Dysuria: Secondary | ICD-10-CM

## 2019-05-21 DIAGNOSIS — R109 Unspecified abdominal pain: Secondary | ICD-10-CM

## 2019-05-21 NOTE — Progress Notes (Signed)
Thank you for the details you included in the comment boxes. Those details are very helpful in determining the best course of treatment for you and help [name] to provide the best care.  Due to your symptoms, we must perform a physical exam and lab work for safety reasons to ensure the infection has not moved up your urinary tract. This is the standard of care and cannot be performed remotely.   Based on what you shared with me, I feel your condition warrants further evaluation and I recommend that you be seen for a face to face office visit.  NOTE: If you entered your credit card information for this eVisit, you will not be charged. You may see a "hold" on your card for the $35 but that hold will drop off and you will not have a charge processed.  If you are having a true medical emergency please call 911.     For an urgent face to face visit, [facility] has five urgent care centers for your convenience:    [email or web address] to reserve your spot online an avoid wait times  [name] (New Address!) [address] [place], [place] [address] *Just off [address], across the road from [place] hours of operation: Monday-Friday, 12 PM to 6 PM  Closed Saturday & Sunday   The following sites will take your insurance:  . [facility]    [phone]                  Get Driving Directions  [address], [place] [address] . 10 am to 8 pm Monday-Friday . 12 pm to 8 pm Saturday-Sunday   . [facility] [place]                  Get Driving Directions  [address], [place], [place] [address] . 8 am to 8 pm Monday-Friday . 9 am to 6 pm Saturday . 11 am to 6 pm Sunday   . [facility] at [place]                  Get Driving Directions   [address], [place] [address] . 8 am to 8 pm  Monday-Friday . 8 am to 4 pm Saturday-Sunday    . [facility] at [place]                    Get Driving Directions  [phone]  [address], [place], [place] [address]  . Monday-Friday, 12 PM to 6 PM    Your e-visit answers were reviewed by a board certified advanced clinical practitioner to complete your personal care plan.  Thank you for using e-Visits.

## 2019-05-24 ENCOUNTER — Telehealth: Payer: Self-pay | Admitting: Obstetrics and Gynecology

## 2019-05-24 ENCOUNTER — Other Ambulatory Visit: Payer: Self-pay | Admitting: Obstetrics and Gynecology

## 2019-05-24 NOTE — Telephone Encounter (Signed)
Pt was called back. Pt stated that she needs a refill of her Zoloft 100 mg. Pt stated that she has 10 days left of the 50 mg tablets.

## 2019-05-24 NOTE — Telephone Encounter (Signed)
Pt called to day asking about refill of Zoloft please advise. Thanks [employer]

## 2019-05-24 NOTE — Telephone Encounter (Signed)
Pt called about a refill of Zoloft

## 2019-05-24 NOTE — Telephone Encounter (Signed)
Pt needs a refill of 100mg  of Zoloft instead of 50 mg. Please advise. Thanks [employer]

## 2019-05-24 NOTE — Telephone Encounter (Signed)
The patient called and stated that she needs a prescription refill. No other information was disclosed. Please advise.

## 2019-05-25 NOTE — Telephone Encounter (Signed)
Can change to 100 mg daily, with 6 refills.

## 2019-05-26 MED ORDER — SERTRALINE HCL 100 MG PO TABS: 100.0000 mg | ORAL_TABLET | Freq: Every day | ORAL | 6 refills | Status: DC

## 2019-05-26 NOTE — Telephone Encounter (Signed)
Pt called no answer LM via VM that her medication of Zoloft 100 mg has been refilled and sent to [facility] in [place].

## 2019-05-26 NOTE — Addendum Note (Signed)
Addended by: Edwyna Shell on: 05/26/2019 01:58 PM   Modules accepted: Orders

## 2019-05-26 NOTE — Telephone Encounter (Signed)
Please see another phone encounter.  

## 2019-06-02 ENCOUNTER — Encounter: Payer: Self-pay | Admitting: Obstetrics and Gynecology

## 2019-06-02 ENCOUNTER — Ambulatory Visit (INDEPENDENT_AMBULATORY_CARE_PROVIDER_SITE_OTHER): Payer: Medicaid Other | Admitting: Obstetrics and Gynecology

## 2019-06-02 ENCOUNTER — Other Ambulatory Visit: Payer: Self-pay

## 2019-06-02 VITALS — BP 98/61 | HR 80 | Ht 62.0 in | Wt 145.4 lb

## 2019-06-02 DIAGNOSIS — R399 Unspecified symptoms and signs involving the genitourinary system: Secondary | ICD-10-CM

## 2019-06-02 DIAGNOSIS — F319 Bipolar disorder, unspecified: Secondary | ICD-10-CM | POA: Diagnosis not present

## 2019-06-02 LAB — POCT URINALYSIS DIPSTICK
Bilirubin, UA: NEGATIVE
Glucose, UA: NEGATIVE
Ketones, UA: NEGATIVE
Nitrite, UA: NEGATIVE
Protein, UA: POSITIVE — AB
Spec Grav, UA: 1.025 (ref 1.010–1.025)
Urobilinogen, UA: 0.2 E.U./dL
pH, UA: 6 (ref 5.0–8.0)

## 2019-06-02 MED ORDER — CIPROFLOXACIN HCL 250 MG PO TABS: 250.0000 mg | ORAL_TABLET | Freq: Two times a day (BID) | ORAL | 0 refills | Status: DC

## 2019-06-02 NOTE — Progress Notes (Signed)
    GYNECOLOGY PROGRESS NOTE  Subjective:    Patient ID: Chelsea Parks, female    DOB: 04-30-1996, 23 y.o.   MRN: [id number]  HPI  Patient is a 23 y.o. G2P3003 female who presents for 2 week follow up for bipolar disorder. She is 4 weeks postpartum.  Was initiated on Lamictal 2 weeks ago as she was already on Zoloft immediately postpartum and was noting more anxiety/manic symptoms. Notes she is doing better. EPDS is 6 today (previously 12).   Also notes complaints of UTI symptoms x 1 week. Notes increased frequency, odor, and change in color of urine. Was also having pain immediately after urination. Has been drinking organic cranberry juice and water. Also taking Azo. Has a history of occasional UTI's in the past.   The following portions of the patient's history were reviewed and updated as appropriate: allergies, current medications, past family history, past medical history, past social history, past surgical history and problem list.  Review of Systems Pertinent items noted in HPI and remainder of comprehensive ROS otherwise negative.   Objective:   Blood pressure 98/61, pulse 80, height 5\' 2"  (1.575 m), weight 145 lb 6.4 oz (66 kg), not currently breastfeeding. No LMP recorded.   General appearance: alert and no distress Abdomen: soft, non-distended.  Mild suprapubic tenderness noted.   Back: symmetric, no curvature. ROM normal. No CVA tenderness. Pelvic: deferred Extremities: extremities normal, atraumatic, no cyanosis or edema    Labs:  Results for orders placed or performed in visit on 06/02/19  POCT urinalysis dipstick  Result Value Ref Range   Color, UA dark yellow    Clarity, UA cloudy    Glucose, UA Negative Negative   Bilirubin, UA neg    Ketones, UA neg    Spec Grav, UA 1.025 1.010 - 1.025   Blood, UA 3+    pH, UA 6.0 5.0 - 8.0   Protein, UA Positive (A) Negative   Urobilinogen, UA 0.2 0.2 or 1.0 E.U./dL   Nitrite, UA neg    Leukocytes, UA Moderate (2+) (A)  Negative   Appearance dark yellow    Odor       Assessment:   Bipolar disorder UTI symptoms  Plan:   1. Bipolar disorder, doing well on Lamictal and Zoloft. To continue.  2. UTI symptoms, blood and protein noted. Patient denies any recent vaginal bleeding. Will treat with Cipro. Urine culture ordered.  3. RTC in 2 weeks for postpartum visit.    Rubie Maid, MD [facility]

## 2019-06-02 NOTE — Patient Instructions (Signed)
Urinary Tract Infection, Adult A urinary tract infection (UTI) is an infection of any part of the urinary tract. The urinary tract includes:  The kidneys.  The ureters.  The bladder.  The urethra. These organs make, store, and get rid of pee (urine) in the body. What are the causes? This is caused by germs (bacteria) in your genital area. These germs grow and cause swelling (inflammation) of your urinary tract. What increases the risk? You are more likely to develop this condition if:  You have a small, thin tube (catheter) to drain pee.  You cannot control when you pee or poop (incontinence).  You are female, and: ? You use these methods to prevent pregnancy: ? A medicine that kills sperm (spermicide). ? A device that blocks sperm (diaphragm). ? You have low levels of a female hormone (estrogen). ? You are pregnant.  You have genes that add to your risk.  You are sexually active.  You take antibiotic medicines.  You have trouble peeing because of: ? A prostate that is bigger than normal, if you are female. ? A blockage in the part of your body that drains pee from the bladder (urethra). ? A kidney stone. ? A nerve condition that affects your bladder (neurogenic bladder). ? Not getting enough to drink. ? Not peeing often enough.  You have other conditions, such as: ? Diabetes. ? A weak disease-fighting system (immune system). ? Sickle cell disease. ? Gout. ? Injury of the spine. What are the signs or symptoms? Symptoms of this condition include:  Needing to pee right away (urgently).  Peeing often.  Peeing small amounts often.  Pain or burning when peeing.  Blood in the pee.  Pee that smells bad or not like normal.  Trouble peeing.  Pee that is cloudy.  Fluid coming from the vagina, if you are female.  Pain in the belly or lower back. Other symptoms include:  Throwing up (vomiting).  No urge to eat.  Feeling mixed up (confused).  Being tired  and grouchy (irritable).  A fever.  Watery poop (diarrhea). How is this treated? This condition may be treated with:  Antibiotic medicine.  Other medicines.  Drinking enough water. Follow these instructions at home:  Medicines  Take over-the-counter and prescription medicines only as told by your doctor.  If you were prescribed an antibiotic medicine, take it as told by your doctor. Do not stop taking it even if you start to feel better. General instructions  Make sure you: ? Pee until your bladder is empty. ? Do not hold pee for a long time. ? Empty your bladder after sex. ? Wipe from front to back after pooping if you are a female. Use each tissue one time when you wipe.  Drink enough fluid to keep your pee pale yellow.  Keep all follow-up visits as told by your doctor. This is important. Contact a doctor if:  You do not get better after 1-2 days.  Your symptoms go away and then come back. Get help right away if:  You have very bad back pain.  You have very bad pain in your lower belly.  You have a fever.  You are sick to your stomach (nauseous).  You are throwing up. Summary  A urinary tract infection (UTI) is an infection of any part of the urinary tract.  This condition is caused by germs in your genital area.  There are many risk factors for a UTI. These include having a small, thin   tube to drain pee and not being able to control when you pee or poop.  Treatment includes antibiotic medicines for germs.  Drink enough fluid to keep your pee pale yellow. This information is not intended to replace advice given to you by your health care provider. Make sure you discuss any questions you have with your health care provider. Document Released: 04/01/2008 Document Revised: 10/01/2018 Document Reviewed: 04/23/2018 Elsevier Patient Education  2020 [employer]  

## 2019-06-02 NOTE — Progress Notes (Signed)
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 EPDS=6. Dark yellPt stated that she was doing better since starting the Lamictal. Pt also stated having uti sx. Urinalysis and urine culture was done.

## 2019-06-05 LAB — URINE CULTURE

## 2019-06-11 ENCOUNTER — Other Ambulatory Visit: Payer: Self-pay

## 2019-06-16 ENCOUNTER — Other Ambulatory Visit: Payer: Self-pay

## 2019-06-16 ENCOUNTER — Encounter: Payer: Medicaid Other | Admitting: Obstetrics and Gynecology

## 2019-06-18 ENCOUNTER — Telehealth: Payer: Self-pay

## 2019-06-18 ENCOUNTER — Other Ambulatory Visit: Payer: Self-pay

## 2019-06-18 ENCOUNTER — Ambulatory Visit: Payer: Medicaid Other | Admitting: Obstetrics and Gynecology

## 2019-06-18 NOTE — Telephone Encounter (Signed)
LM due to pt was in the office for her appointment, but when went to call back for visit pt was gone. Front desk stated that they show pt walk out the front door thinking she was finish with her appt.

## 2019-06-18 NOTE — Progress Notes (Signed)
Patient not in waiting room when called for her appointment.  Called patient by phone and sent Mychart message regarding missed visit.   Chelsea Maid, MD [facility]

## 2019-06-29 ENCOUNTER — Telehealth: Payer: Self-pay

## 2019-06-29 NOTE — Telephone Encounter (Signed)
Pt called no answer LM via VM that I was concerned about her since her last visit that she checked into the office and left without being seen by [facility] and not informing the office staff that she had left. Pt was encouraged to please call the office to speak to [facility] nurse.

## 2019-07-08 ENCOUNTER — Telehealth: Payer: Self-pay

## 2019-07-08 NOTE — Telephone Encounter (Signed)
Called pt to check on her since she walked out of the office before her last visit. Pt did not answer and a vm was let with a message stating that I was calling to check on her due to her leaving the office before her visit. Pt was encouraged to call the office to speak to staff and to reschedule her appt if needed.

## 2019-07-19 ENCOUNTER — Telehealth: Payer: Self-pay | Admitting: Obstetrics and Gynecology

## 2019-07-19 ENCOUNTER — Other Ambulatory Visit: Payer: Self-pay

## 2019-07-19 NOTE — Telephone Encounter (Signed)
The patient called and asked if she is able to drop off a urine. I informed pt a message will have to be sent back to her nurse to approve drop off. Pt voiced understanding. Please advise.

## 2019-07-19 NOTE — Telephone Encounter (Signed)
Spoke with via mychart.

## 2019-09-27 ENCOUNTER — Telehealth: Payer: Self-pay | Admitting: Obstetrics and Gynecology

## 2019-09-27 NOTE — Telephone Encounter (Signed)
The patient called and stated that she needs to do a urine drop off. Pt is aware a nurse/provider will have to approve schedule add on. Pt is requesting a call back. Please advise.

## 2019-09-27 NOTE — Telephone Encounter (Signed)
She can drop off a urine. We need to just get vitals and chief complaint from her so we can bill as a nurse visit.

## 2019-09-28 ENCOUNTER — Ambulatory Visit (INDEPENDENT_AMBULATORY_CARE_PROVIDER_SITE_OTHER): Payer: Medicaid Other | Admitting: Obstetrics and Gynecology

## 2019-09-28 ENCOUNTER — Other Ambulatory Visit: Payer: Self-pay

## 2019-09-28 ENCOUNTER — Encounter: Payer: Self-pay | Admitting: Obstetrics and Gynecology

## 2019-09-28 VITALS — BP 101/64 | HR 74 | Ht 62.0 in | Wt 135.6 lb

## 2019-09-28 DIAGNOSIS — R399 Unspecified symptoms and signs involving the genitourinary system: Secondary | ICD-10-CM

## 2019-09-28 MED ORDER — CIPROFLOXACIN HCL 500 MG PO TABS: 500.0000 mg | ORAL_TABLET | Freq: Two times a day (BID) | ORAL | 0 refills | Status: DC

## 2019-09-28 NOTE — Progress Notes (Signed)
Patient has been taking Azo so unable to perform UA, will treat prophylactically for her symptoms and await urine culture results.   Rubie Maid, MD [facility]

## 2019-09-28 NOTE — Telephone Encounter (Signed)
Pt called no answer and vm is full. Sent [employer].

## 2019-09-28 NOTE — Progress Notes (Signed)
Pt present for urine drop off. Pt stated having back pain and frequent urination. Pt has been taking AZO for 2 days.   Blood pressure 101/64, pulse 74, height 5\' 2"  (1.575 m), weight 135 lb 9.6 oz (61.5 kg), not currently breastfeeding.

## 2019-09-28 NOTE — Patient Instructions (Signed)
Urinary Tract Infection, Adult A urinary tract infection (UTI) is an infection of any part of the urinary tract. The urinary tract includes:  The kidneys.  The ureters.  The bladder.  The urethra. These organs make, store, and get rid of pee (urine) in the body. What are the causes? This is caused by germs (bacteria) in your genital area. These germs grow and cause swelling (inflammation) of your urinary tract. What increases the risk? You are more likely to develop this condition if:  You have a small, thin tube (catheter) to drain pee.  You cannot control when you pee or poop (incontinence).  You are female, and: ? You use these methods to prevent pregnancy: ? A medicine that kills sperm (spermicide). ? A device that blocks sperm (diaphragm). ? You have low levels of a female hormone (estrogen). ? You are pregnant.  You have genes that add to your risk.  You are sexually active.  You take antibiotic medicines.  You have trouble peeing because of: ? A prostate that is bigger than normal, if you are female. ? A blockage in the part of your body that drains pee from the bladder (urethra). ? A kidney stone. ? A nerve condition that affects your bladder (neurogenic bladder). ? Not getting enough to drink. ? Not peeing often enough.  You have other conditions, such as: ? Diabetes. ? A weak disease-fighting system (immune system). ? Sickle cell disease. ? Gout. ? Injury of the spine. What are the signs or symptoms? Symptoms of this condition include:  Needing to pee right away (urgently).  Peeing often.  Peeing small amounts often.  Pain or burning when peeing.  Blood in the pee.  Pee that smells bad or not like normal.  Trouble peeing.  Pee that is cloudy.  Fluid coming from the vagina, if you are female.  Pain in the belly or lower back. Other symptoms include:  Throwing up (vomiting).  No urge to eat.  Feeling mixed up (confused).  Being tired  and grouchy (irritable).  A fever.  Watery poop (diarrhea). How is this treated? This condition may be treated with:  Antibiotic medicine.  Other medicines.  Drinking enough water. Follow these instructions at home:  Medicines  Take over-the-counter and prescription medicines only as told by your doctor.  If you were prescribed an antibiotic medicine, take it as told by your doctor. Do not stop taking it even if you start to feel better. General instructions  Make sure you: ? Pee until your bladder is empty. ? Do not hold pee for a long time. ? Empty your bladder after sex. ? Wipe from front to back after pooping if you are a female. Use each tissue one time when you wipe.  Drink enough fluid to keep your pee pale yellow.  Keep all follow-up visits as told by your doctor. This is important. Contact a doctor if:  You do not get better after 1-2 days.  Your symptoms go away and then come back. Get help right away if:  You have very bad back pain.  You have very bad pain in your lower belly.  You have a fever.  You are sick to your stomach (nauseous).  You are throwing up. Summary  A urinary tract infection (UTI) is an infection of any part of the urinary tract.  This condition is caused by germs in your genital area.  There are many risk factors for a UTI. These include having a small, thin   tube to drain pee and not being able to control when you pee or poop.  Treatment includes antibiotic medicines for germs.  Drink enough fluid to keep your pee pale yellow. This information is not intended to replace advice given to you by your health care provider. Make sure you discuss any questions you have with your health care provider. Document Released: 04/01/2008 Document Revised: 10/01/2018 Document Reviewed: 04/23/2018 Elsevier Patient Education  2020 [employer]  

## 2019-09-30 LAB — URINE CULTURE

## 2019-10-01 ENCOUNTER — Other Ambulatory Visit: Payer: Self-pay

## 2019-10-01 MED ORDER — FLUCONAZOLE 150 MG PO TABS: 150.0000 mg | ORAL_TABLET | Freq: Once | ORAL | 0 refills | Status: AC

## 2019-10-14 DIAGNOSIS — F3132 Bipolar disorder, current episode depressed, moderate: Secondary | ICD-10-CM | POA: Diagnosis not present

## 2019-10-27 ENCOUNTER — Telehealth: Payer: Self-pay | Admitting: Obstetrics and Gynecology

## 2019-10-27 NOTE — Telephone Encounter (Signed)
LM for patient to return call.

## 2019-10-27 NOTE — Telephone Encounter (Signed)
Pt called with uti symptoms pt was seen 09/28/19 by cherry pt needs to drop off was wanting to know if we can put her in for 3:45 for today . Please advise

## 2019-10-27 NOTE — Telephone Encounter (Signed)
I did not talk to patient. Please try her tomorrow.

## 2019-10-28 ENCOUNTER — Other Ambulatory Visit: Payer: Self-pay

## 2019-10-28 ENCOUNTER — Telehealth: Payer: Self-pay

## 2019-10-28 ENCOUNTER — Ambulatory Visit (INDEPENDENT_AMBULATORY_CARE_PROVIDER_SITE_OTHER): Payer: Medicaid Other | Admitting: Obstetrics and Gynecology

## 2019-10-28 VITALS — BP 123/78 | HR 91 | Wt 130.2 lb

## 2019-10-28 DIAGNOSIS — F3132 Bipolar disorder, current episode depressed, moderate: Secondary | ICD-10-CM | POA: Diagnosis not present

## 2019-10-28 DIAGNOSIS — R399 Unspecified symptoms and signs involving the genitourinary system: Secondary | ICD-10-CM

## 2019-10-28 LAB — POCT URINALYSIS DIPSTICK
Bilirubin, UA: NEGATIVE
Glucose, UA: NEGATIVE
Ketones, UA: 15
Leukocytes, UA: NEGATIVE
Nitrite, UA: NEGATIVE
Protein, UA: POSITIVE — AB
Spec Grav, UA: 1.02 (ref 1.010–1.025)
Urobilinogen, UA: 0.2 E.U./dL
pH, UA: 6.5 (ref 5.0–8.0)

## 2019-10-28 MED ORDER — SULFAMETHOXAZOLE-TRIMETHOPRIM 800-160 MG PO TABS: 1.0000 | ORAL_TABLET | Freq: Two times a day (BID) | ORAL | 0 refills | Status: DC

## 2019-10-28 NOTE — Progress Notes (Signed)
Pt present due to UTI symptoms. Pt stated that having pressure when she urinate and frequent urine and odor. Pt stated that she started back taking her Zoloft and needed a refill. Pt stated that she has started counseling.

## 2019-10-28 NOTE — Telephone Encounter (Signed)
Pt called to check to see if she had any refills left on her Zoloft. Pt stated that she was unsure.

## 2019-10-28 NOTE — Telephone Encounter (Signed)
Spoke to pt to see if she could come in today for a urine drop off and see a nurse. Pt stated that she could. Pt stated that she did not receive any calls from the office and was going to call to this morning to discuss her issues with a nurse. Pt stated that she could come in today for a urine drop off.

## 2019-10-29 DIAGNOSIS — M6289 Other specified disorders of muscle: Secondary | ICD-10-CM

## 2019-10-29 HISTORY — DX: Other specified disorders of muscle: M62.89

## 2019-10-30 LAB — URINE CULTURE: Organism ID, Bacteria: NO GROWTH

## 2019-11-02 ENCOUNTER — Encounter: Payer: Self-pay | Admitting: Obstetrics and Gynecology

## 2019-11-02 MED ORDER — SERTRALINE HCL 100 MG PO TABS: 100.0000 mg | ORAL_TABLET | Freq: Every day | ORAL | 6 refills | Status: DC

## 2019-11-15 ENCOUNTER — Other Ambulatory Visit: Payer: Self-pay

## 2019-11-15 DIAGNOSIS — R399 Unspecified symptoms and signs involving the genitourinary system: Secondary | ICD-10-CM

## 2019-12-08 ENCOUNTER — Other Ambulatory Visit: Payer: Self-pay

## 2019-12-08 ENCOUNTER — Ambulatory Visit (INDEPENDENT_AMBULATORY_CARE_PROVIDER_SITE_OTHER): Payer: Medicaid Other | Admitting: Urology

## 2019-12-08 ENCOUNTER — Encounter: Payer: Self-pay | Admitting: Urology

## 2019-12-08 VITALS — BP 110/79 | HR 68 | Ht 62.0 in | Wt 119.0 lb

## 2019-12-08 DIAGNOSIS — M6289 Other specified disorders of muscle: Secondary | ICD-10-CM | POA: Diagnosis not present

## 2019-12-08 DIAGNOSIS — R300 Dysuria: Secondary | ICD-10-CM | POA: Diagnosis not present

## 2019-12-08 LAB — URINALYSIS, COMPLETE
Bilirubin, UA: NEGATIVE
Glucose, UA: NEGATIVE
Leukocytes,UA: NEGATIVE
Nitrite, UA: NEGATIVE
RBC, UA: NEGATIVE
Specific Gravity, UA: 1.025 (ref 1.005–1.030)
Urobilinogen, Ur: 0.2 mg/dL (ref 0.2–1.0)
pH, UA: 6.5 (ref 5.0–7.5)

## 2019-12-08 LAB — MICROSCOPIC EXAMINATION
Bacteria, UA: NONE SEEN
RBC, Urine: NONE SEEN /hpf (ref 0–2)

## 2019-12-08 LAB — BLADDER SCAN AMB NON-IMAGING: Scan Result: 20

## 2019-12-08 NOTE — Progress Notes (Signed)
12/08/2019 3:03 PM   [place] 1996-04-01 [id number]  Referring provider: Rubie Maid, [place],  [place] [id number]  Chief Complaint  Patient presents with  . Dysuria    HPI: Pleasant 25 year old female who presents today for further evaluation of recurrent urinary tract infections and dysuria.  She reports that she has been struggling with recurrent infections since the birth of her last child.  Prior to this, she did not have any issues.  She was admitted postpartum with what sounds like pyelonephritis and ever since then, has been having increasing difficulties with recurring infections.  She also describes an incident when she was in labor when a catheter was aggressively placed by the physician she is worried about trauma.  She only has 2 documented urinary tract infections over the past 12 months.  She had a Proteus urinary tract infections on 06/02/2019 as well as an E. coli UTI on 09/28/2019.  This time, she had symptoms which are traditional with infection including urgency frequency and dysuria.  No fevers or chills.  No associated flank pain.  She also has multiple occasions where she will have burning with urination and discomfort.  She has had her urine checked numerous times on these occasions and they were all negative for infection.  Which she describes is having the urge to urinate and when she sits down, she has some lower abdominal pressure.  She struggles to get her urinary stream started.  This often involves her doing deep breathing exercises or trying to relax.  When she does void, she has some discomfort which she describes as internal in her lower abdomen.  She feels like she has to push to get out her urine.  She has some brief cramping afterwards with that her sensation is relieved.  She also has chronic loose stool and occasional bouts of constipation.  She has had some issues with discomfort/vaginal dryness with intercourse.   She is sexually active daily.  She is very conscientious about hygiene related to both urination and sexual activity.   Urgency frequency.  No incontinence.   PMH: Past Medical History:  Diagnosis Date  . H/O postpartum depression, currently pregnant   . Herpes genitalis in women     Surgical History: Past Surgical History:  Procedure Laterality Date  . CERVICAL BIOPSY    . no surgical history      Home Medications:  Allergies as of 12/08/2019   No Known Allergies     Medication List       Accurate as of December 08, 2019  3:03 PM. If you have any questions, ask your nurse or doctor.        STOP taking these medications   ciprofloxacin 500 MG tablet Commonly known as: CIPRO Stopped by: Hollice Espy, MD   ibuprofen 800 MG tablet Commonly known as: ADVIL Stopped by: Hollice Espy, MD   sulfamethoxazole-trimethoprim 800-160 MG tablet Commonly known as: BACTRIM DS Stopped by: Hollice Espy, MD     TAKE these medications   AZO URINARY PAIN PO Take by mouth.   CitraNatal B-Calm 20-1 MG & 2 x 25 MG Misc Take 1 tablet by mouth daily.   lamoTRIgine 25 MG tablet Commonly known as: LaMICtal Take 1 tablet (25 mg total) by mouth daily.   sertraline 100 MG tablet Commonly known as: ZOLOFT Take 1 tablet (100 mg total) by mouth daily.       Allergies: No Known Allergies  Family History:  Family History  Problem Relation Age of Onset  . Healthy Mother   . Healthy Father   . Lupus Maternal Aunt   . Pancreatic cancer Maternal Uncle   . Stroke Maternal Grandfather   . Kidney cancer Neg Hx     Social History:  reports that she has quit smoking. She has never used smokeless tobacco. She reports previous alcohol use. She reports that she does not use drugs.  Physical Exam: BP 110/79   Pulse 68   Ht 5\' 2"  (1.575 m)   Wt 119 lb (54 kg)   BMI 21.77 kg/m   Constitutional:  Alert and oriented, No acute distress. HEENT: [place] AT, moist mucus membranes.   Trachea midline, no masses. Cardiovascular: No clubbing, cyanosis, or edema. Respiratory: Normal respiratory effort, no increased work of breathing. Pelvic exam: Chaperoned by CMA, .  Normal external genitalia.  Normal urethral meatus.  Healthy pink vaginal mucosa, well estrogenized.  Stage I cystocele with Valsalva, otherwise good apical and posterior vault support. Skin: No rashes, bruises or suspicious lesions. Neurologic: Grossly intact, no focal deficits, moving all 4 extremities. Psychiatric: Normal mood and affect.  Laboratory Data: Lab Results  Component Value Date   WBC 10.9 (H) 05/01/2019   HGB 9.4 (L) 05/01/2019   HCT 29.1 (L) 05/01/2019   MCV 98.0 05/01/2019   PLT 156 05/01/2019    Lab Results  Component Value Date   CREATININE 0.76 05/21/2018    Urinalysis UA today reviewed, negative  Pertinent Imaging: Results for orders placed or performed in visit on 12/08/19  Microscopic Examination   URINE  Result Value Ref Range   WBC, UA 0-5 0 - 5 /hpf   RBC None seen 0 - 2 /hpf   Epithelial Cells (non renal) 0-10 0 - 10 /hpf   Casts Present (A) None seen /lpf   Cast Type Granular casts (A) N/A   Mucus, UA Present (A) Not Estab.   Bacteria, UA None seen None seen/Few  Urinalysis, Complete  Result Value Ref Range   Specific Gravity, UA 1.025 1.005 - 1.030   pH, UA 6.5 5.0 - 7.5   Color, UA Orange Yellow   Appearance Ur Hazy (A) Clear   Leukocytes,UA Negative Negative   Protein,UA Trace (A) Negative/Trace   Glucose, UA Negative Negative   Ketones, UA Trace (A) Negative   RBC, UA Negative Negative   Bilirubin, UA Negative Negative   Urobilinogen, Ur 0.2 0.2 - 1.0 mg/dL   Nitrite, UA Negative Negative   Microscopic Examination See below:   Bladder Scan (Post Void Residual) in office  Result Value Ref Range   Scan Result 20     Assessment & Plan:    1. Dysuria/ Recurrent UTI Lengthy history taking and discussion today.  She is only 2 documented  urinary tract infections and often has negative urines when she is symptomatic.  I am highly suspicious that much of her difficulties with urination may be related to #2 rather than true infection.  We discussed today the only way to tell for certain when she is having dysuria urgency frequency is to treat the below and continue to rule out infections that she has been doing.  Advised to come in on as needed basis for same-day UA/urine evaluation.  She is adequately emptying.  This point in time, there is no role for additional evaluation including upper tract imaging or cystoscopy until that she has had several more documented true infections.  We did discuss hygiene  issues today at length both related to voiding and sexual activity.  We also discussed helpful things including bowel management, cranberry tablets and probiotics for UTI prevention.  - Urinalysis, Complete - Bladder Scan (Post Void Residual) in office  2. Pelvic floor dysfunction Lengthy discussion today about her symptoms which are fairly consistent with pelvic floor dysfunction.  This is likely partially or primarily contributing to her symptoms as outlined above.  Believe that she benefit from pelvic therapy to which she is agreeable. - Ambulatory referral to Physical Therapy    Advised to return with worsening urinary symptoms, concern for infection, or failure of the above interventions.  Vanna Scotland, MD  [facility] [address] [place], [place] [address] [phone]

## 2019-12-14 DIAGNOSIS — F3132 Bipolar disorder, current episode depressed, moderate: Secondary | ICD-10-CM | POA: Diagnosis not present

## 2019-12-15 ENCOUNTER — Ambulatory Visit: Payer: Medicaid Other | Attending: Urology | Admitting: Physical Therapy

## 2019-12-15 ENCOUNTER — Other Ambulatory Visit: Payer: Self-pay

## 2019-12-15 DIAGNOSIS — M62838 Other muscle spasm: Secondary | ICD-10-CM

## 2019-12-15 DIAGNOSIS — M6281 Muscle weakness (generalized): Secondary | ICD-10-CM | POA: Insufficient documentation

## 2019-12-15 DIAGNOSIS — R278 Other lack of coordination: Secondary | ICD-10-CM | POA: Diagnosis not present

## 2019-12-15 NOTE — Therapy (Signed)
[place] [facility] [address]. [place], [place], [address] Phone: [phone]   Fax:  [phone]  Physical Therapy Evaluation  Patient Details  Name: Chelsea Parks MRN: [id number] Date of Birth: 01/17/1996 Referring Provider (PT): Vanna Scotland   Encounter Date: 12/15/2019  PT End of Session - 12/15/19 0837    Visit Number  1    Number of Visits  13    Date for PT Re-Evaluation  03/08/20    Authorization Type  Medicaid    Authorization - Visit Number  1    Authorization - Number of Visits  4    PT Start Time  0850    PT Stop Time  0950    PT Time Calculation (min)  60 min    Activity Tolerance  Patient tolerated treatment well    Behavior During Therapy  [facility] for tasks assessed/performed       Past Medical History:  Diagnosis Date  . H/O postpartum depression, currently pregnant   . Herpes genitalis in women     Past Surgical History:  Procedure Laterality Date  . CERVICAL BIOPSY    . no surgical history      There were no vitals filed for this visit.    [facility] PT Assessment - 12/15/19 0001      Assessment   Medical Diagnosis  Pelvic Floor Dysfxn    Referring Provider (PT)  Vanna Scotland    Onset Date/Surgical Date  12/14/18    Hand Dominance  Right    Next MD Visit  PRN    Prior Therapy  None for this dx      Precautions   Precautions  None      Balance Screen   Has the patient fallen in the past 6 months  No       PELVIC HEALTH PHYSICAL THERAPY EVALUATION  SCREENING Red Flags: None reported. Have you had any night sweats? Unexplained weight loss? Saddle anesthesia? Unexplained changes in bowel or bladder habits?  Precautions: None  SUBJECTIVE   Chief Complaint: Patient notes that she has the most difficulty with urination. She feels pain in the urethra which she defines as a tingling sensation constantly. She reports that she has tried everything. Patient also goes between diarrhea and  constipation. She notes her BMs are either watery or she is having to scoop it out. She notes these problems are occurring every single day. Patient notes high level of pain with BM.  Pertinent History: Per MD Note:  "She was admitted postpartum with what sounds like pyelonephritis and ever since then, has been having increasing difficulties with recurring infections.  She also describes an incident when she was in labor when a catheter was aggressively placed by the physician she is worried about trauma [...] She also has multiple occasions where she will have burning with urination and discomfort."  Falls Negative.  Scoliosis Negative. Pulmonary disease/dysfunction Negative. Surgical history: Positive for cervical biopsy.   Recent Procedures/Tests/Findings: None  Obstetrical History: G3P3 Deliveries: Vaginal Tearing/Episiotomy: G1 and G2: stitches (grade 3 tear, patient unsure); G3 no tear Birthing position: Back; over 30 hours in labor for all 3. Patient notes pushing less than 10 times for each infant.   Gynecological History: Hysterectomy: No  Endometriosis: Negative Last Menstrual Period: 11/25/2019 Pain with exam: No   Urinary History: Incontinence: Positive for cough/laugh/sneeze. 30% of the time.  Amount: Mod Fluid Intake: 32-48 oz H20, 12 oz caffeinated, 1x juices/sodas Nocturia: 0 x/night  Frequency of urination: unsure Pain with urination: Positive Difficulty initiating urination: Positive Frequent UTI: Positive for patient report.   Gastrointestinal History: [place] Stool Chart: Type 7 everyday, occasional Type 1 Frequency of BMs: 1/day Pain with defecation: Positive Straining with defecation: Positive Incontinence: Negative.  Typical food intake: Breakfast: nothing Lunch: nothing Dinner: small meals Snacks: yogurt. Naked smoothies. Toast. Patient notes loss of appetite because of issues with BMs and GI pain with eating. Patient notes mildly increased appetite later  in the day. Patient has lost 10 lbs in 4 weeks without trying.   Sexual activity/pain: Pain with intercourse: Positive.   Initial penetration: No  Deep thrustingYes  Difficulty with orgasm: internal activities    Location of pain: near the urethra Current pain:  3/10  Max pain:  10/10 Least pain:  3/10 Pain quality: pain quality: burning, tingling  Location of pain: abdomen, anus Current pain:  0/10  Max pain:  10/10 Least pain:  0/10 Pain quality: pain quality: sharp, burning, ripping  Patient assessment of present state: "I have problems with urinating and problems with pooping. I feel like digestion and everything is all out of whack."  Current activities:  Childcare  Patient Goals:  1. To feel confident about using the bathroom 2. To be able to relax my pelvic floor 3. Just regulating  Patient perception of overall health: Fair  OBJECTIVE  Mental Status Patient is oriented to person, place and time.  Recent memory is intact.  Remote memory is intact.  Attention span and concentration are intact.  Expressive speech is intact.  Patient's fund of knowledge is within normal limits for educational level.  POSTURE/OBSERVATIONS:  Lumbar lordosis: WNL Patient sits with R lateral flexion of spine, legs crossed (switched throughout session) and increased posterior pelvic tilt in sitting.  GAIT: Trendelenburg R: Negative L: Negative  RANGE OF MOTION: grossly WFL   LEFT RIGHT  Lumbar forward flexion (65):      Lumbar extension (30):     Lumbar lateral flexion (25):     Thoracic and Lumbar rotation (30 degrees):       Hip Flexion (0-125):      Hip IR (0-45):     Hip ER (0-45):     Hip Abduction (0-40):     Hip extension (0-15):        SENSATION: Grossly intact to light touch bilateral LEs as determined by testing dermatomes L2-S2 Proprioception and hot/cold testing deferred on this date  STRENGTH: MMT deferred 2/2 to extensive history taking and patient  education  RLE LLE  Hip Flexion    Hip Extension    Hip Abduction     Hip Adduction     Hip ER     Hip IR     Knee Extension    Knee Flexion    Dorsiflexion     Plantarflexion (seated)     ABDOMINAL: deferred 2/2 to time constraints Palpation: Diastasis: Scar mobility: Rib flare:  PHYSICAL PERFORMANCE MEASURES: STS: WNL  EXTERNAL PELVIC EXAM: deferred 2/2 to time constraints Palpation: Breath coordination: Cued Lengthen: Cued Contraction: Cough:  INTERNAL VAGINAL EXAM: deferred 2/2 to time constraints Introitus Appears:  Skin integrity:  Scar mobility: Strength (PERF):  Symmetry: Palpation: Prolapse:   INTERNAL RECTAL EXAM: deferred 2/2 to time constraints Strength (PERF): Symmetry: Palpation: Prolapse:   OUTCOME MEASURES: PFDI-20: 202.2/300 (POPDI 66.7; CRADI 68.8; UDI 66.7) NIH-CPSI: 35/43 (15 Pain, 8 Urinary, 12 QoL)    ASSESSMENT Patient is a 24 year old presenting to clinic  with chief complaints of difficulty urinating and frequent UTIs. Upon examination, patient demonstrates deficits in posture, pain, PFM coordination, PFM strength, and PFM extensibility as evidenced by increased R lateral flexion of trunk, increased hip adduction B, 10/10 pain with defecation and urination, presence of SUI, habitual digital disimpaction, sensation of incomplete emptying of bladder. Patient's responses on PFDI-20 and NIH-CPSI outcome measures (202.2/300 and 35/43 respectively) indicate moderate functional limitations/disability/distress. Patient's progress may be limited due to comorbidities; however, patient's interest in conservative management is advantageous. Patient was able to achieve basic understanding of pelvic floor muscle function during today's evaluation and responded positively to educational interventions. Patient will benefit from continued skilled therapeutic intervention to address deficits in posture, pain, PFM coordination, PFM strength, and PFM  extensibility in order to increase function and improve overall QOL.  EDUCATION Patient educated on prognosis, POC, and provided with HEP including: diaphragmatic breathing with PFM lengthening. Patient articulated understanding and returned demonstration. Patient will benefit from further education in order to maximize compliance and understanding for long-term therapeutic gains.  TREATMENT Neuromuscular Re-education: Patient educated on primary functions of the pelvic floor including: posture/balance, sexual pleasure, storage and elimination of waste from the body, abdominal cavity closure, and breath coordination. Patient educated on factors impacting health outcomes including: clinical care, health behaviors, environment, and socioeconomics.    Objective measurements completed on examination: See above findings.        PT Long Term Goals - 12/15/19 1547      PT LONG TERM GOAL #1   Title  Patient will demonstrate independence with HEP in order to maximize therapeutic gains and improve carryover from physical therapy sessions to ADLs in the home and community.    Baseline  IE: not initiated    Time  12    Period  Weeks    Status  New    Target Date  03/08/20      PT LONG TERM GOAL #2   Title  Patient will demonstrate independent and coordinated diaphragmatic breathing in supine with a 1:2 breathing pattern for improved down-regulation of the nervous system and improved management of intra-abdominal pressures in order to increase function at home and in the community.    Baseline  IE: not demonstrated    Time  12    Period  Weeks    Status  New    Target Date  03/08/20      PT LONG TERM GOAL #3   Title  Patient will decrease worst pain as reported on NPRS by at least 2 points to demonstrate clinically significant reduction in pain in order to restore/improve function and overall QOL.    Baseline  IE: 10/10 (urethral and rectal)    Time  12    Period  Weeks    Status  New     Target Date  03/08/20      PT LONG TERM GOAL #4   Title  Patient will indicate at least a 45 point difference on the PFDI-20 form to demonstrate clinically significant improvement of PFM for a return to PLOF at home and in the community.    Baseline  IE: 202.2/300 (POPDI 66.7; CRADI 68.8; UDI 66.7)    Time  12    Period  Weeks    Status  New    Target Date  03/08/20      PT LONG TERM GOAL #5   Title  Patient will indicate at least a 7 point difference on the NIH-CPSI Female form to  demonstrate clinically significant improvement in pain severity for improved overall QOL.    Baseline  IE: 35/43 (15 Pain, 8 Urinary, 12 QoL)    Time  12    Period  Weeks    Status  New    Target Date  03/08/20             Plan - 12/15/19 5631    Clinical Impression Statement  Patient is a 24 year old presenting to clinic with chief complaints of difficulty urinating and frequent UTIs. Upon examination, patient demonstrates deficits in posture, pain, PFM coordination, PFM strength, and PFM extensibility as evidenced by increased R lateral flexion of trunk, increased hip adduction B, 10/10 pain with defecation and urination, presence of SUI, habitual digital disimpaction, sensation of incomplete emptying of bladder. Patient's responses on PFDI-20 and NIH-CPSI outcome measures (202.2/300 and 35/43 respectively) indicate moderate functional limitations/disability/distress. Patient's progress may be limited due to comorbidities; however, patient's interest in conservative management is advantageous. Patient was able to achieve basic understanding of pelvic floor muscle function during today's evaluation and responded positively to educational interventions. Patient will benefit from continued skilled therapeutic intervention to address deficits in posture, pain, PFM coordination, PFM strength, and PFM extensibility in order to increase function and improve overall QOL.    Personal Factors and Comorbidities   Age;Education;Sex;Social Background;Fitness;Time since onset of injury/illness/exacerbation;Behavior Pattern;Past/Current Experience;Comorbidity 1    Comorbidities  bipolar 1, postpartum depression    Examination-Activity Limitations  Continence;Toileting;Caring for Others;Lift;Squat;Stairs;Reach Overhead;Stand;Sit;Transfers    Examination-Participation Restrictions  Interpersonal Relationship;Cleaning;Laundry;Shop;Meal Prep    Stability/Clinical Decision Making  Evolving/Moderate complexity    Clinical Decision Making  Moderate    Rehab Potential  Fair    PT Frequency  1x / week    PT Duration  12 weeks    PT Treatment/Interventions  ADLs/Self Care Home Management;Biofeedback;Moist Heat;Cryotherapy;Electrical Stimulation;Therapeutic activities;Functional mobility training;Stair training;Gait training;Therapeutic exercise;Balance training;Neuromuscular re-education;Scar mobilization;Manual techniques;Patient/family education;Taping;Orthotic Fit/Training;Dry needling;Passive range of motion;Joint Manipulations;Spinal Manipulations    PT Next Visit Plan  External PFM, IAP management basics    PT Home Exercise Plan  Bladder diary, Bowel Retraining    Consulted and Agree with Plan of Care  Patient       Patient will benefit from skilled therapeutic intervention in order to improve the following deficits and impairments:  Abnormal gait, Decreased balance, Decreased mobility, Decreased endurance, Difficulty walking, Increased muscle spasms, Pain, Postural dysfunction, Improper body mechanics, Decreased range of motion, Decreased strength, Increased fascial restricitons, Decreased coordination, Decreased activity tolerance  Visit Diagnosis: Other lack of coordination  Other muscle spasm  Muscle weakness (generalized)     Problem List Patient Active Problem List   Diagnosis Date Noted  . Postpartum anemia 05/01/2019  . HSV infection 09/09/2018  . Bipolar 1 disorder (HCC) 09/10/2017  .  Depression, postpartum 07/20/2017   Sheria Lang PT, DPT [phone] 12/15/2019, 3:49 PM  [facility] [address] [place], [place], [address] Phone: [phone]   Fax:  [phone]  Name: Chelsea Parks MRN: [id number] Date of Birth: Oct 03, 1996

## 2019-12-20 ENCOUNTER — Other Ambulatory Visit: Payer: Self-pay

## 2019-12-20 ENCOUNTER — Encounter: Payer: Self-pay | Admitting: Nurse Practitioner

## 2019-12-20 ENCOUNTER — Ambulatory Visit (INDEPENDENT_AMBULATORY_CARE_PROVIDER_SITE_OTHER): Payer: Medicaid Other | Admitting: Nurse Practitioner

## 2019-12-20 VITALS — BP 99/70 | HR 92 | Temp 99.0°F | Ht 63.0 in | Wt 117.6 lb

## 2019-12-20 DIAGNOSIS — F4310 Post-traumatic stress disorder, unspecified: Secondary | ICD-10-CM | POA: Insufficient documentation

## 2019-12-20 DIAGNOSIS — F319 Bipolar disorder, unspecified: Secondary | ICD-10-CM

## 2019-12-20 DIAGNOSIS — R1084 Generalized abdominal pain: Secondary | ICD-10-CM | POA: Diagnosis not present

## 2019-12-20 DIAGNOSIS — M6289 Other specified disorders of muscle: Secondary | ICD-10-CM | POA: Diagnosis not present

## 2019-12-20 DIAGNOSIS — Z23 Encounter for immunization: Secondary | ICD-10-CM | POA: Diagnosis not present

## 2019-12-20 DIAGNOSIS — R109 Unspecified abdominal pain: Secondary | ICD-10-CM | POA: Insufficient documentation

## 2019-12-20 NOTE — Assessment & Plan Note (Signed)
Ongoing, continue collaboration with GYN, urology, and physical therapy.  Review notes and plans when available.  Patient to continue keeping bladder diary. 
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
## 2019-12-20 NOTE — Assessment & Plan Note (Signed)
With mixed diarrhea and constipation + some recent weight loss reported, suspect some underlying IBS possibly related to past traumatic experiences; however also has well water at home and underlying pelvic floor issues.  Will obtain labs today: CBC, CMP, TSH, H. Pylori, and Celiac testing.  Recommend trial of daily probiotic and keeping food + bowel pattern journal.  Miralax as needed for periods of constipation.  Will have return in 3 weeks, if ongoing symptoms and weight loss and WNL labs will consider imaging and referral to GI.

## 2019-12-20 NOTE — Patient Instructions (Signed)

## 2019-12-20 NOTE — Assessment & Plan Note (Signed)
Chronic, ongoing.  Continue collaboration with psychiatry and current medication regimen.  Continue therapy with [facility] and [facility].  Denies SI/HI.

## 2019-12-20 NOTE — Assessment & Plan Note (Signed)
Chronic, ongoing.  Continue current medication regimen and adjust as needed.  Continue collaboration with psychiatry at [facility] and therapy.  Denies SI/HI.

## 2019-12-22 ENCOUNTER — Encounter: Payer: Self-pay | Admitting: Physical Therapy

## 2019-12-22 ENCOUNTER — Ambulatory Visit: Payer: Medicaid Other | Admitting: Physical Therapy

## 2019-12-22 ENCOUNTER — Other Ambulatory Visit: Payer: Self-pay

## 2019-12-22 DIAGNOSIS — M6281 Muscle weakness (generalized): Secondary | ICD-10-CM

## 2019-12-22 DIAGNOSIS — R278 Other lack of coordination: Secondary | ICD-10-CM | POA: Diagnosis not present

## 2019-12-22 DIAGNOSIS — M62838 Other muscle spasm: Secondary | ICD-10-CM | POA: Diagnosis not present

## 2019-12-22 LAB — CBC WITH DIFFERENTIAL/PLATELET
Basophils Absolute: 0.1 10*3/uL (ref 0.0–0.2)
Basos: 1 %
EOS (ABSOLUTE): 0.1 10*3/uL (ref 0.0–0.4)
Eos: 1 %
Hematocrit: 38.4 % (ref 34.0–46.6)
Hemoglobin: 13.3 g/dL (ref 11.1–15.9)
Immature Grans (Abs): 0 10*3/uL (ref 0.0–0.1)
Immature Granulocytes: 0 %
Lymphocytes Absolute: 1.6 10*3/uL (ref 0.7–3.1)
Lymphs: 17 %
MCH: 31.1 pg (ref 26.6–33.0)
MCHC: 34.6 g/dL (ref 31.5–35.7)
MCV: 90 fL (ref 79–97)
Monocytes Absolute: 0.5 10*3/uL (ref 0.1–0.9)
Monocytes: 5 %
Neutrophils Absolute: 7.1 10*3/uL — ABNORMAL HIGH (ref 1.4–7.0)
Neutrophils: 76 %
Platelets: 229 10*3/uL (ref 150–450)
RBC: 4.27 x10E6/uL (ref 3.77–5.28)
RDW: 13.3 % (ref 11.7–15.4)
WBC: 9.4 10*3/uL (ref 3.4–10.8)

## 2019-12-22 LAB — COMPREHENSIVE METABOLIC PANEL
ALT: 6 IU/L (ref 0–32)
AST: 12 IU/L (ref 0–40)
Albumin/Globulin Ratio: 2.4 — ABNORMAL HIGH (ref 1.2–2.2)
Albumin: 4.8 g/dL (ref 3.9–5.0)
Alkaline Phosphatase: 72 IU/L (ref 39–117)
BUN/Creatinine Ratio: 10 (ref 9–23)
BUN: 6 mg/dL (ref 6–20)
Bilirubin Total: 0.3 mg/dL (ref 0.0–1.2)
CO2: 21 mmol/L (ref 20–29)
Calcium: 9.3 mg/dL (ref 8.7–10.2)
Chloride: 107 mmol/L — ABNORMAL HIGH (ref 96–106)
Creatinine, Ser: 0.59 mg/dL (ref 0.57–1.00)
GFR calc Af Amer: 148 mL/min/{1.73_m2} (ref >59)
GFR calc non Af Amer: 129 mL/min/{1.73_m2} (ref >59)
Globulin, Total: 2 g/dL (ref 1.5–4.5)
Glucose: 78 mg/dL (ref 65–99)
Potassium: 3.8 mmol/L (ref 3.5–5.2)
Sodium: 138 mmol/L (ref 134–144)
Total Protein: 6.8 g/dL (ref 6.0–8.5)

## 2019-12-22 LAB — H PYLORI, IGM, IGG, IGA AB
H pylori, IgM Abs: <9 units (ref 0.0–8.9)
H. pylori, IgA Abs: <9 units (ref 0.0–8.9)
H. pylori, IgG AbS: 0.2 Index Value (ref 0.00–0.79)

## 2019-12-22 LAB — TISSUE TRANSGLUTAMINASE ABS,IGG,IGA
Tissue Transglut Ab: <2 U/mL (ref 0–5)
Transglutaminase IgA: <2 U/mL (ref 0–3)

## 2019-12-22 LAB — TSH: TSH: 0.49 u[IU]/mL (ref 0.450–4.500)

## 2019-12-22 NOTE — Therapy (Signed)
[facility] [address]. [place], [place], [address] Phone: [phone]   Fax:  [phone]  Physical Therapy Treatment  Patient Details  Name: Chelsea Parks MRN: [id number] Date of Birth: 26-Jan-1996 Referring Provider (PT): Vanna Scotland   Encounter Date: 12/22/2019  PT End of Session - 12/22/19 1116    Visit Number  2    Number of Visits  13    Date for PT Re-Evaluation  03/08/20    Authorization Type  Medicaid    Authorization - Visit Number  2    Authorization - Number of Visits  4    PT Start Time  1100    PT Stop Time  1155    PT Time Calculation (min)  55 min    Activity Tolerance  Patient tolerated treatment well    Behavior During Therapy  [facility] for tasks assessed/performed       Past Medical History:  Diagnosis Date  . Anxiety   . Depression   . H/O postpartum depression, currently pregnant   . Herpes genitalis in women     Past Surgical History:  Procedure Laterality Date  . CERVICAL BIOPSY    . no surgical history      There were no vitals filed for this visit.  Subjective Assessment - 12/22/19 1103    Subjective  Patient notes that she had some difficulty with the bladder diary. Patient presents with bladder diary with 4-5 empties/day of durations averaging ~20 sec. Patient notes she did not have any bouts of diarrhea this week. Patient notes that she is constipated but is not having to splint or use digital disimpaction. Patient notes that she has been able to have BMs without straining. Patient notes she has been using a stool for toileting posture which has helped with emptying.    Currently in Pain?  No/denies      TREATMENT  Neuromuscular Re-education: Patient educated extensively on typical bladder function, typical bladder habits, basics of urge suppression, bladder irritants, toileting posture, and bowel retraining in order to better regulate bowel and bladder through behavioral changes.   Patient educated extensively on intra-abdominal pressure system and impacts on pressure from, breath patterns, PFM, and core musculature. Supine diaphragmatic breathing with VCs and TCs for downregulation of the nervous system and improved management of IAP Supine hooklying, PFM lengthening with inhalation. VCs and TCs to decrease compensatory patterns and encourage optimal relaxation of the PFM.   Patient educated throughout session on appropriate technique and form using multi-modal cueing, HEP, and activity modification. Patient articulated understanding and returned demonstration.  Patient Response to interventions: Patient eager to make behavioral changes for improved bladder function.  ASSESSMENT Patient presents to clinic with excellent motivation to participate in therapy. Patient demonstrates deficits in posture, pain, PFM coordination, PFM strength, and PFM extensibility. Patient able to achieve coordinated diaphragmatic breath with 1:1 ratio during today's session and responded positively to educational interventions. Patient will benefit from continued skilled therapeutic intervention to address remaining deficits in posture, pain, PFM coordination, PFM strength, and PFM extensibility in order to increase function and improve overall QOL.    PT Long Term Goals - 12/15/19 1547      PT LONG TERM GOAL #1   Title  Patient will demonstrate independence with HEP in order to maximize therapeutic gains and improve carryover from physical therapy sessions to ADLs in the home and community.    Baseline  IE: not initiated    Time  12    Period  Weeks    Status  New    Target Date  03/08/20      PT LONG TERM GOAL #2   Title  Patient will demonstrate independent and coordinated diaphragmatic breathing in supine with a 1:2 breathing pattern for improved down-regulation of the nervous system and improved management of intra-abdominal pressures in order to increase function at home and in the  community.    Baseline  IE: not demonstrated    Time  12    Period  Weeks    Status  New    Target Date  03/08/20      PT LONG TERM GOAL #3   Title  Patient will decrease worst pain as reported on NPRS by at least 2 points to demonstrate clinically significant reduction in pain in order to restore/improve function and overall QOL.    Baseline  IE: 10/10 (urethral and rectal)    Time  12    Period  Weeks    Status  New    Target Date  03/08/20      PT LONG TERM GOAL #4   Title  Patient will indicate at least a 45 point difference on the PFDI-20 form to demonstrate clinically significant improvement of PFM for a return to PLOF at home and in the community.    Baseline  IE: 202.2/300 (POPDI 66.7; CRADI 68.8; UDI 66.7)    Time  12    Period  Weeks    Status  New    Target Date  03/08/20      PT LONG TERM GOAL #5   Title  Patient will indicate at least a 7 point difference on the NIH-CPSI Female form to demonstrate clinically significant improvement in pain severity for improved overall QOL.    Baseline  IE: 35/43 (15 Pain, 8 Urinary, 12 QoL)    Time  12    Period  Weeks    Status  New    Target Date  03/08/20            Plan - 12/22/19 1313    Clinical Impression Statement  Patient presents to clinic with excellent motivation to participate in therapy. Patient demonstrates deficits in posture, pain, PFM coordination, PFM strength, and PFM extensibility. Patient able to achieve coordinated diaphragmatic breath with 1:1 ratio during today's session and responded positively to educational interventions. Patient will benefit from continued skilled therapeutic intervention to address remaining deficits in posture, pain, PFM coordination, PFM strength, and PFM extensibility in order to increase function and improve overall QOL.    Personal Factors and Comorbidities  Age;Education;Sex;Social Background;Fitness;Time since onset of injury/illness/exacerbation;Behavior Pattern;Past/Current  Experience;Comorbidity 1    Comorbidities  bipolar 1, postpartum depression    Examination-Activity Limitations  Continence;Toileting;Caring for Others;Lift;Squat;Stairs;Reach Overhead;Stand;Sit;Transfers    Examination-Participation Restrictions  Interpersonal Relationship;Cleaning;Laundry;Shop;Meal Prep    Stability/Clinical Decision Making  Evolving/Moderate complexity    Rehab Potential  Fair    PT Frequency  1x / week    PT Duration  12 weeks    PT Treatment/Interventions  ADLs/Self Care Home Management;Biofeedback;Moist Heat;Cryotherapy;Electrical Stimulation;Therapeutic activities;Functional mobility training;Stair training;Gait training;Therapeutic exercise;Balance training;Neuromuscular re-education;Scar mobilization;Manual techniques;Patient/family education;Taping;Orthotic Fit/Training;Dry needling;Passive range of motion;Joint Manipulations;Spinal Manipulations    PT Next Visit Plan  External PFM, IAP management basics    PT Home Exercise Plan  Bladder diary, Bowel Retraining    Consulted and Agree with Plan of Care  Patient       Patient will benefit from skilled therapeutic  intervention in order to improve the following deficits and impairments:  Abnormal gait, Decreased balance, Decreased mobility, Decreased endurance, Difficulty walking, Increased muscle spasms, Pain, Postural dysfunction, Improper body mechanics, Decreased range of motion, Decreased strength, Increased fascial restricitons, Decreased coordination, Decreased activity tolerance  Visit Diagnosis: Other lack of coordination  Other muscle spasm  Muscle weakness (generalized)     Problem List Patient Active Problem List   Diagnosis Date Noted  . PTSD (post-traumatic stress disorder) 12/20/2019  . Pelvic floor dysfunction 12/20/2019  . Abdominal pain 12/20/2019  . HSV infection 09/09/2018  . Bipolar 1 disorder ([place]) 09/10/2017   Myles Gip PT, DPT [phone] 12/22/2019, 1:24 PM  [facility] [address] [place], [place], [address] Phone: [phone]   Fax:  [phone]  Name: Chelsea Parks MRN: [id number] Date of Birth: 1996/10/08

## 2019-12-22 NOTE — Progress Notes (Signed)
Contacted via MyChart

## 2019-12-27 ENCOUNTER — Encounter: Payer: Self-pay | Admitting: Nurse Practitioner

## 2019-12-27 ENCOUNTER — Other Ambulatory Visit: Payer: Self-pay | Admitting: Nurse Practitioner

## 2019-12-27 MED ORDER — PROBIOTIC 250 MG PO CAPS: 1.0000 | ORAL_CAPSULE | Freq: Two times a day (BID) | ORAL | 3 refills | Status: DC

## 2019-12-27 MED ORDER — POLYETHYLENE GLYCOL 3350 17 GM/SCOOP PO POWD: 17.0000 g | Freq: Every day | ORAL | 6 refills | Status: DC

## 2019-12-28 DIAGNOSIS — F3132 Bipolar disorder, current episode depressed, moderate: Secondary | ICD-10-CM | POA: Diagnosis not present

## 2019-12-29 ENCOUNTER — Encounter: Payer: Self-pay | Admitting: Physical Therapy

## 2019-12-29 ENCOUNTER — Other Ambulatory Visit: Payer: Self-pay

## 2019-12-29 ENCOUNTER — Ambulatory Visit: Payer: Medicaid Other | Attending: Urology | Admitting: Physical Therapy

## 2019-12-29 DIAGNOSIS — M62838 Other muscle spasm: Secondary | ICD-10-CM

## 2019-12-29 DIAGNOSIS — R278 Other lack of coordination: Secondary | ICD-10-CM | POA: Insufficient documentation

## 2019-12-29 DIAGNOSIS — M6281 Muscle weakness (generalized): Secondary | ICD-10-CM | POA: Diagnosis not present

## 2019-12-29 NOTE — Therapy (Signed)
[facility] [address] [place], [place], [address] Phone: [phone]   Fax:  [phone]  Physical Therapy Treatment  Patient Details  Name: Chelsea Parks MRN: [id number] Date of Birth: 12/21/1995 Referring Provider (PT): Vanna Scotland   Encounter Date: 12/29/2019  PT End of Session - 12/29/19 1110    Visit Number  3    Number of Visits  13    Date for PT Re-Evaluation  03/08/20    Authorization Type  Medicaid    Authorization - Visit Number  3    Authorization - Number of Visits  4    PT Start Time  1100    PT Stop Time  1209    PT Time Calculation (min)  69 min    Activity Tolerance  Patient tolerated treatment well    Behavior During Therapy  [facility] for tasks assessed/performed       Past Medical History:  Diagnosis Date  . Anxiety   . Depression   . H/O postpartum depression, currently pregnant   . Herpes genitalis in women     Past Surgical History:  Procedure Laterality Date  . CERVICAL BIOPSY    . no surgical history      There were no vitals filed for this visit.  Subjective Assessment - 12/29/19 1101    Subjective  Patient notes that she is currently menstruating and having some bowel irregularity. Patient has been practicing diaphragmatic breathing and retraining bowels. Patient notes stool has been helpful. She notes her frequency of emptying bowels is higher, but continues to vary between Type 7 and Type 1.    Currently in Pain?  No/denies      TREATMENT  Manual Therapy: STM of abdomen for improved bowel peristalsis and stimulation. Patient educated on how to perform at home and demonstrated understanding.   Neuromuscular Re-education: Patient educated extensively on typical bladder function, typical bladder habits, basics of urge suppression, bladder irritants, toileting posture, and bowel retraining in order to better regulate bowel and bladder through behavioral changes.  Patient educated  extensively on intra-abdominal pressure system and impacts on pressure from, breath patterns, PFM, and core musculature. Supine diaphragmatic breathing with VCs and TCs for downregulation of the nervous system and improved management of IAP Body mechanics for bed mobility with appropriately managed IAP.  Patient educated throughout session on appropriate technique and form using multi-modal cueing, HEP, and activity modification. Patient articulated understanding and returned demonstration.  Patient Response to interventions: Patient 3/10 confidence with colon massage; provided with handouts to improve confidence.  ASSESSMENT Patient presents to clinic with excellent motivation to participate in therapy. Patient demonstrates deficits in posture, pain, PFM coordination, PFM strength, and PFM extensibility. Patient able to demonstrate good understanding of bowel anatomy basics and colon massage during today's session and responded positively to educational interventions. Patient will benefit from continued skilled therapeutic intervention to address remaining deficits in posture, pain, PFM coordination, PFM strength, and PFM extensibility in order to increase function and improve overall QOL.     PT Long Term Goals - 12/15/19 1547      PT LONG TERM GOAL #1   Title  Patient will demonstrate independence with HEP in order to maximize therapeutic gains and improve carryover from physical therapy sessions to ADLs in the home and community.    Baseline  IE: not initiated    Time  12    Period  Weeks    Status  New  Target Date  03/08/20      PT LONG TERM GOAL #2   Title  Patient will demonstrate independent and coordinated diaphragmatic breathing in supine with a 1:2 breathing pattern for improved down-regulation of the nervous system and improved management of intra-abdominal pressures in order to increase function at home and in the community.    Baseline  IE: not demonstrated    Time  12     Period  Weeks    Status  New    Target Date  03/08/20      PT LONG TERM GOAL #3   Title  Patient will decrease worst pain as reported on NPRS by at least 2 points to demonstrate clinically significant reduction in pain in order to restore/improve function and overall QOL.    Baseline  IE: 10/10 (urethral and rectal)    Time  12    Period  Weeks    Status  New    Target Date  03/08/20      PT LONG TERM GOAL #4   Title  Patient will indicate at least a 45 point difference on the PFDI-20 form to demonstrate clinically significant improvement of PFM for a return to PLOF at home and in the community.    Baseline  IE: 202.2/300 (POPDI 66.7; CRADI 68.8; UDI 66.7)    Time  12    Period  Weeks    Status  New    Target Date  03/08/20      PT LONG TERM GOAL #5   Title  Patient will indicate at least a 7 point difference on the NIH-CPSI Female form to demonstrate clinically significant improvement in pain severity for improved overall QOL.    Baseline  IE: 35/43 (15 Pain, 8 Urinary, 12 QoL)    Time  12    Period  Weeks    Status  New    Target Date  03/08/20            Plan - 12/29/19 1111    Clinical Impression Statement  Patient presents to clinic with excellent motivation to participate in therapy. Patient demonstrates deficits in posture, pain, PFM coordination, PFM strength, and PFM extensibility. Patient able to demonstrate good understanding of bowel anatomy basics and colon massage during today's session and responded positively to educational interventions. Patient will benefit from continued skilled therapeutic intervention to address remaining deficits in posture, pain, PFM coordination, PFM strength, and PFM extensibility in order to increase function and improve overall QOL.    Personal Factors and Comorbidities  Age;Education;Sex;Social Background;Fitness;Time since onset of injury/illness/exacerbation;Behavior Pattern;Past/Current Experience;Comorbidity 1    Comorbidities   bipolar 1, postpartum depression    Examination-Activity Limitations  Continence;Toileting;Caring for Others;Lift;Squat;Stairs;Reach Overhead;Stand;Sit;Transfers    Examination-Participation Restrictions  Interpersonal Relationship;Cleaning;Laundry;Shop;Meal Prep    Stability/Clinical Decision Making  Evolving/Moderate complexity    Rehab Potential  Fair    PT Frequency  1x / week    PT Duration  12 weeks    PT Treatment/Interventions  ADLs/Self Care Home Management;Biofeedback;Moist Heat;Cryotherapy;Electrical Stimulation;Therapeutic activities;Functional mobility training;Stair training;Gait training;Therapeutic exercise;Balance training;Neuromuscular re-education;Scar mobilization;Manual techniques;Patient/family education;Taping;Orthotic Fit/Training;Dry needling;Passive range of motion;Joint Manipulations;Spinal Manipulations    PT Next Visit Plan  External PFM, IAP management basics    PT Home Exercise Plan  Bladder diary, Bowel Retraining    Consulted and Agree with Plan of Care  Patient       Patient will benefit from skilled therapeutic intervention in order to improve the following deficits and impairments:  Abnormal gait,  Decreased balance, Decreased mobility, Decreased endurance, Difficulty walking, Increased muscle spasms, Pain, Postural dysfunction, Improper body mechanics, Decreased range of motion, Decreased strength, Increased fascial restricitons, Decreased coordination, Decreased activity tolerance  Visit Diagnosis: Other lack of coordination  Other muscle spasm  Muscle weakness (generalized)     Problem List Patient Active Problem List   Diagnosis Date Noted  . PTSD (post-traumatic stress disorder) 12/20/2019  . Pelvic floor dysfunction 12/20/2019  . Abdominal pain 12/20/2019  . HSV infection 09/09/2018  . Bipolar 1 disorder (HCC) 09/10/2017   Sheria Lang PT, DPT [phone] 12/29/2019, 12:35 PM  [facility] [address] [place], [place], [address] Phone: [phone]   Fax:  [phone]  Name: Chelsea Parks MRN: [id number] Date of Birth: Aug 20, 1996

## 2020-01-05 ENCOUNTER — Encounter: Payer: Self-pay | Admitting: Physical Therapy

## 2020-01-05 ENCOUNTER — Ambulatory Visit: Payer: Medicaid Other | Admitting: Physical Therapy

## 2020-01-05 ENCOUNTER — Other Ambulatory Visit: Payer: Self-pay

## 2020-01-05 DIAGNOSIS — R278 Other lack of coordination: Secondary | ICD-10-CM

## 2020-01-05 DIAGNOSIS — M6281 Muscle weakness (generalized): Secondary | ICD-10-CM | POA: Diagnosis not present

## 2020-01-05 DIAGNOSIS — M62838 Other muscle spasm: Secondary | ICD-10-CM

## 2020-01-05 NOTE — Therapy (Signed)
[facility] [address] [place], [place], [address] Phone: [phone]   Fax:  [phone]  Physical Therapy Treatment  Patient Details  Name: Chelsea Parks MRN: [id number] Date of Birth: [birth year] Referring Provider (PT): Hollice Espy   Encounter Date: 01/05/2020  PT End of Session - 01/05/20 1229    Visit Number  4    Number of Visits  13    Date for PT Re-Evaluation  03/08/20    Authorization Type  Medicaid    Authorization - Visit Number  4    Authorization - Number of Visits  4    PT Start Time  6962    PT Stop Time  1200    PT Time Calculation (min)  55 min    Activity Tolerance  Patient tolerated treatment well    Behavior During Therapy  [facility] for tasks assessed/performed       Past Medical History:  Diagnosis Date  . Anxiety   . Depression   . H/O postpartum depression, currently pregnant   . Herpes genitalis in women     Past Surgical History:  Procedure Laterality Date  . CERVICAL BIOPSY    . no surgical history      There were no vitals filed for this visit.  Subjective Assessment - 01/05/20 1104    Subjective  Patient notes that she has been very constipated lately. Patient notes that she has been able to empty everyday but is digitally disimpacting, straining, and has Type 1. Patient notes she just finished her mestrual cycle.    Currently in Pain?  No/denies      TREATMENT  Neuromuscular Re-education: Patient educated extensively on bowel retraining strategies and collaborated with goal setting and health behavior changes/initiation. Patient provided with handouts on bowel retraining and strategies for increasing fiber intake for improved bowel function and regularity. Reassessed goals.  Patient educated throughout session on appropriate technique and form using multi-modal cueing, HEP, and activity modification. Patient articulated understanding and returned demonstration.  Patient Response  to interventions: Patient commits to eating prunes nightly as a way to stimulate bowel function.  ASSESSMENT Patient presents to clinic with excellent motivation to participate in therapy. Patient demonstrates deficits in posture, pain, PFM coordination, PFM strength, and PFM extensibility. Patient indicated significant improvement in worst pain in the past week (7/1) during today's session and responded positively to educational interventions. Patient's condition has the potential to improve in response to therapy. Maximum improvement is yet to be obtained. The anticipated improvement is attainable and reasonable in a generally predictable time. Patient will benefit from continued skilled therapeutic intervention to address remaining deficits in posture, pain, PFM coordination, PFM strength, and PFM extensibility in order to increase function and improve overall QOL.    PT Long Term Goals - 01/05/20 1154      PT LONG TERM GOAL #1   Title  Patient will demonstrate independence with HEP in order to maximize therapeutic gains and improve carryover from physical therapy sessions to ADLs in the home and community.    Baseline  IE: not initiated; 3/10: 90%    Time  12    Period  Weeks    Status  On-going    Target Date  03/08/20      PT LONG TERM GOAL #2   Title  Patient will demonstrate independent and coordinated diaphragmatic breathing in supine with a 1:2 breathing pattern for improved down-regulation of the nervous system and improved management of  intra-abdominal pressures in order to increase function at home and in the community.    Baseline  IE: not demonstrated; 3/10: requires some cueing    Time  12    Period  Weeks    Status  New    Target Date  03/08/20      PT LONG TERM GOAL #3   Title  Patient will decrease worst pain as reported on NPRS by at least 2 points to demonstrate clinically significant reduction in pain in order to restore/improve function and overall QOL.    Baseline   IE: 10/10 (urethral and rectal); 3/10: 7/10    Time  12    Period  Weeks    Status  On-going    Target Date  03/08/20      PT LONG TERM GOAL #4   Title  Patient will indicate at least a 45 point difference on the PFDI-20 form to demonstrate clinically significant improvement of PFM for a return to PLOF at home and in the community.    Baseline  IE: 202.2/300 (POPDI 66.7; CRADI 68.8; UDI 66.7)    Time  12    Period  Weeks    Status  On-going    Target Date  03/08/20      PT LONG TERM GOAL #5   Title  Patient will indicate at least a 7 point difference on the NIH-CPSI Female form to demonstrate clinically significant improvement in pain severity for improved overall QOL.    Baseline  IE: 35/43 (15 Pain, 8 Urinary, 12 QoL)    Time  12    Period  Weeks    Status  On-going    Target Date  03/08/20            Plan - 01/05/20 1228    Clinical Impression Statement  Patient presents to clinic with excellent motivation to participate in therapy. Patient demonstrates deficits in posture, pain, PFM coordination, PFM strength, and PFM extensibility. Patient indicated significant improvement in worst pain in the past week (7/1) during today's session and responded positively to educational interventions. Patient's condition has the potential to improve in response to therapy. Maximum improvement is yet to be obtained. The anticipated improvement is attainable and reasonable in a generally predictable time. Patient will benefit from continued skilled therapeutic intervention to address remaining deficits in posture, pain, PFM coordination, PFM strength, and PFM extensibility in order to increase function and improve overall QOL.    Personal Factors and Comorbidities  Age;Education;Sex;Social Background;Fitness;Time since onset of injury/illness/exacerbation;Behavior Pattern;Past/Current Experience;Comorbidity 1    Comorbidities  bipolar 1, postpartum depression    Examination-Activity Limitations   Continence;Toileting;Caring for Others;Lift;Squat;Stairs;Reach Overhead;Stand;Sit;Transfers    Examination-Participation Restrictions  Interpersonal Relationship;Cleaning;Laundry;Shop;Meal Prep    Stability/Clinical Decision Making  Evolving/Moderate complexity    Rehab Potential  Fair    PT Frequency  1x / week    PT Duration  12 weeks    PT Treatment/Interventions  ADLs/Self Care Home Management;Biofeedback;Moist Heat;Cryotherapy;Electrical Stimulation;Therapeutic activities;Functional mobility training;Stair training;Gait training;Therapeutic exercise;Balance training;Neuromuscular re-education;Scar mobilization;Manual techniques;Patient/family education;Taping;Orthotic Fit/Training;Dry needling;Passive range of motion;Joint Manipulations;Spinal Manipulations    PT Next Visit Plan  IAP management basics; PFM relaxation    PT Home Exercise Plan  Bowel Retraining    Consulted and Agree with Plan of Care  Patient       Patient will benefit from skilled therapeutic intervention in order to improve the following deficits and impairments:  Abnormal gait, Decreased balance, Decreased mobility, Decreased endurance, Difficulty walking, Increased muscle spasms,  Pain, Postural dysfunction, Improper body mechanics, Decreased range of motion, Decreased strength, Increased fascial restricitons, Decreased coordination, Decreased activity tolerance  Visit Diagnosis: Other lack of coordination  Other muscle spasm  Muscle weakness (generalized)     Problem List Patient Active Problem List   Diagnosis Date Noted  . PTSD (post-traumatic stress disorder) 12/20/2019  . Pelvic floor dysfunction 12/20/2019  . Abdominal pain 12/20/2019  . HSV infection 09/09/2018  . Bipolar 1 disorder (HCC) 09/10/2017   Sheria Lang PT, DPT [phone] 01/05/2020, 12:30 PM  [facility] [address] [place], [place], [address] Phone: [phone]   Fax:   [phone]  Name: Chelsea Parks MRN: [id number] Date of Birth: 1996/09/20

## 2020-01-11 ENCOUNTER — Encounter: Payer: Self-pay | Admitting: Nurse Practitioner

## 2020-01-11 ENCOUNTER — Telehealth (INDEPENDENT_AMBULATORY_CARE_PROVIDER_SITE_OTHER): Payer: Medicaid Other | Admitting: Nurse Practitioner

## 2020-01-11 DIAGNOSIS — M6289 Other specified disorders of muscle: Secondary | ICD-10-CM

## 2020-01-11 DIAGNOSIS — R1084 Generalized abdominal pain: Secondary | ICD-10-CM | POA: Diagnosis not present

## 2020-01-11 NOTE — Progress Notes (Signed)
There were no vitals taken for this visit.   Subjective:    Patient ID: Chelsea Parks, female    DOB: 12-18-1995, 24 y.o.   MRN: [id number]  HPI: Chelsea Parks is a 24 y.o. female  Chief Complaint  Patient presents with  . Abdominal Pain    3 week f/up    . This visit was completed via MyChart due to the restrictions of the COVID-19 pandemic. All issues as above were discussed and addressed. Physical exam was done as above through visual confirmation on MyChart. If it was felt that the patient should be evaluated in the office, they were directed there. The patient verbally consented to this visit. . Location of the patient: home . Location of the provider: work . Those involved with this call:  . Provider: Marnee Guarneri, DNP . CMA: Yvonna Alanis, CMA . Front Desk/Registration: Don Perking  . Time spent on call: 15 minutes with patient face to face via video conference. More than 50% of this time was spent in counseling and coordination of care. 10 minutes total spent in review of patient's record and preparation of their chart.  . I verified patient identity using two factors (patient name and date of birth). Patient consents verbally to being seen via telemedicine visit today.     PELVIC FLOOR DYSFUNCTION & ABDOMINAL PAIN: Followed by Dr. Marcelline Mates and last seen 09/28/2019, has also been seen by Dr. Erlene Quan with urology on 12/08/2019.  She was referred to physical therapy which she started on 12/15/2019 and continues with last visit with them 01/05/2020.  Has bladder diary.  At last visit she reported loss of 10 pounds in one month, she reports she does not feel like she has lost anymore weight.  Does not have scale at home, but clothes are not changing.  Recent labs all WNL in office.    She reports her BM pattern goes between diarrhea and constipation, with periods of watery stool, but is no longer forcing stool to come out or strain, some improvement here.  This is an  every day pattern.  Reports abdominal pain has improved, which she feels is related to change in regimen and diet.  Taking probiotic twice a day.  Her aunt and grandmother both have the same issues and recommended she try Miralax, has been using this method when she is constipated. No history of Covid or recent illness. Endorses varying appetite, most days eats two meals a day.  Is taking protein shake + smoothies.  She feels she is about 40% improvement, does report needs to improve her eating habits still.  Not interested in referral to GI at this time, as some improvement present. Status: stable Treatments attempted: none Fever: no Nausea: yes Vomiting: no Weight loss: none Decreased appetite: yes Diarrhea: yes Constipation: yes Blood in stool: no  Heartburn: no Jaundice: no Rash: no Dysuria/urinary frequency: after she urinates, followed by urology Hematuria: no History of sexually transmitted disease: yes, genital herpes since 2014, last oubreak unknown -- has taken Acyclovir Recurrent NSAID use: no  Relevant past medical, surgical, family and social history reviewed and updated as indicated. Interim medical history since our last visit reviewed. Allergies and medications reviewed and updated.  Review of Systems  Constitutional: Negative for activity change, appetite change, diaphoresis, fatigue and fever.  Respiratory: Negative for cough, chest tightness and shortness of breath.   Cardiovascular: Negative for chest pain, palpitations and leg swelling.  Gastrointestinal: Positive for constipation and diarrhea. Negative for  abdominal distention, abdominal pain, nausea and vomiting.  Endocrine: Negative.   Neurological: Negative.   Psychiatric/Behavioral: Negative.     Per HPI unless specifically indicated above     Objective:    There were no vitals taken for this visit.  Wt Readings from Last 3 Encounters:  12/20/19 117 lb 9.6 oz (53.3 kg)  12/08/19 119 lb (54 kg)    10/28/19 130 lb 3.2 oz (59.1 kg)    Physical Exam   Unable to perform, as patient camera not working, could hear her via MyChart only.  Results for orders placed or performed in visit on 12/20/19  Tissue Transglutaminase Abs,IgG,IgA  Result Value Ref Range   Transglutaminase IgA <2 0 - 3 U/mL   Tissue Transglut Ab <2 0 - 5 U/mL  H Pylori, IGM, IGG, IGA AB  Result Value Ref Range   H. pylori, IgG AbS 0.20 0.00 - 0.79 Index Value   H. pylori, IgA Abs <9.0 0.0 - 8.9 units   H pylori, IgM Abs <9.0 0.0 - 8.9 units  Comprehensive metabolic panel  Result Value Ref Range   Glucose 78 65 - 99 mg/dL   BUN 6 6 - 20 mg/dL   Creatinine, Ser 5.42 0.57 - 1.00 mg/dL   GFR calc non Af Amer 129 >59 mL/min/1.73   GFR calc Af Amer 148 >59 mL/min/1.73   BUN/Creatinine Ratio 10 9 - 23   Sodium 138 134 - 144 mmol/L   Potassium 3.8 3.5 - 5.2 mmol/L   Chloride 107 (H) 96 - 106 mmol/L   CO2 21 20 - 29 mmol/L   Calcium 9.3 8.7 - 10.2 mg/dL   Total Protein 6.8 6.0 - 8.5 g/dL   Albumin 4.8 3.9 - 5.0 g/dL   Globulin, Total 2.0 1.5 - 4.5 g/dL   Albumin/Globulin Ratio 2.4 (H) 1.2 - 2.2   Bilirubin Total 0.3 0.0 - 1.2 mg/dL   Alkaline Phosphatase 72 39 - 117 IU/L   AST 12 0 - 40 IU/L   ALT 6 0 - 32 IU/L  TSH  Result Value Ref Range   TSH 0.490 0.450 - 4.500 uIU/mL  CBC with Differential/Platelet  Result Value Ref Range   WBC 9.4 3.4 - 10.8 x10E3/uL   RBC 4.27 3.77 - 5.28 x10E6/uL   Hemoglobin 13.3 11.1 - 15.9 g/dL   Hematocrit 70.6 23.7 - 46.6 %   MCV 90 79 - 97 fL   MCH 31.1 26.6 - 33.0 pg   MCHC 34.6 31.5 - 35.7 g/dL   RDW 62.8 31.5 - 17.6 %   Platelets 229 150 - 450 x10E3/uL   Neutrophils 76 Not Estab. %   Lymphs 17 Not Estab. %   Monocytes 5 Not Estab. %   Eos 1 Not Estab. %   Basos 1 Not Estab. %   Neutrophils Absolute 7.1 (H) 1.4 - 7.0 x10E3/uL   Lymphocytes Absolute 1.6 0.7 - 3.1 x10E3/uL   Monocytes Absolute 0.5 0.1 - 0.9 x10E3/uL   EOS (ABSOLUTE) 0.1 0.0 - 0.4 x10E3/uL    Basophils Absolute 0.1 0.0 - 0.2 x10E3/uL   Immature Granulocytes 0 Not Estab. %   Immature Grans (Abs) 0.0 0.0 - 0.1 x10E3/uL      Assessment & Plan:   Problem List Items Addressed This Visit      Musculoskeletal and Integument   Pelvic floor dysfunction    Ongoing, continue collaboration with GYN, urology, and physical therapy.  Review notes and plans when available.  Patient to continue  keeping bladder diary.        Other   Abdominal pain    Improving, continues to have fluctuating bowel pattern, but this is improving as well.  Continue current medication regimen and adjust as needed.  Recommend continue focus on diet and ensuring protein shakes or smoothies daily.  Return in 2 months for follow-up or sooner if worsening symptoms.  Consider referral to GI if any worsening or ongoing, at this time she does not wish referral as is having some improvement.         I discussed the assessment and treatment plan with the patient. The patient was provided an opportunity to ask questions and all were answered. The patient agreed with the plan and demonstrated an understanding of the instructions.   The patient was advised to call back or seek an in-person evaluation if the symptoms worsen or if the condition fails to improve as anticipated.   I provided 15+ minutes of time during this encounter.  Follow up plan: Return in about 2 months (around 03/12/2020) for Follow-up Mood and IBS.

## 2020-01-11 NOTE — Patient Instructions (Signed)

## 2020-01-11 NOTE — Assessment & Plan Note (Signed)
Improving, continues to have fluctuating bowel pattern, but this is improving as well.  Continue current medication regimen and adjust as needed.  Recommend continue focus on diet and ensuring protein shakes or smoothies daily.  Return in 2 months for follow-up or sooner if worsening symptoms.  Consider referral to GI if any worsening or ongoing, at this time she does not wish referral as is having some improvement.

## 2020-01-11 NOTE — Assessment & Plan Note (Signed)
Ongoing, continue collaboration with GYN, urology, and physical therapy.  Review notes and plans when available.  Patient to continue keeping bladder diary.

## 2020-01-12 ENCOUNTER — Encounter: Payer: Self-pay | Admitting: Physical Therapy

## 2020-01-12 ENCOUNTER — Ambulatory Visit: Payer: Medicaid Other | Admitting: Physical Therapy

## 2020-01-12 ENCOUNTER — Other Ambulatory Visit: Payer: Self-pay

## 2020-01-12 DIAGNOSIS — M62838 Other muscle spasm: Secondary | ICD-10-CM | POA: Diagnosis not present

## 2020-01-12 DIAGNOSIS — M6281 Muscle weakness (generalized): Secondary | ICD-10-CM | POA: Diagnosis not present

## 2020-01-12 DIAGNOSIS — R278 Other lack of coordination: Secondary | ICD-10-CM | POA: Diagnosis not present

## 2020-01-12 NOTE — Therapy (Signed)
[facility] [address] [place], [place], [address] Phone: [phone]   Fax:  [phone]  Physical Therapy Treatment  Patient Details  Name: Chelsea Parks MRN: [id number] Date of Birth: Sep 22, 1996 Referring Provider (PT): Vanna Scotland   Encounter Date: 01/12/2020  PT End of Session - 01/12/20 1113    Visit Number  5    Number of Visits  13    Date for PT Re-Evaluation  03/08/20    Authorization Type  Medicaid    Authorization - Visit Number  1    Authorization - Number of Visits  12    PT Start Time  1105    PT Stop Time  1200    PT Time Calculation (min)  55 min    Activity Tolerance  Patient tolerated treatment well    Behavior During Therapy  [facility] for tasks assessed/performed       Past Medical History:  Diagnosis Date  . Anxiety   . Depression   . H/O postpartum depression, currently pregnant   . Herpes genitalis in women     Past Surgical History:  Procedure Laterality Date  . CERVICAL BIOPSY    . no surgical history      There were no vitals filed for this visit.  Subjective Assessment - 01/12/20 1108    Subjective  Patient notes that she has been trying to eat more regulalry and consuming vegetables and fruits in the morning with her protein shake. Patient had PCP visit and discussed introduction of B6 vitamins to see if it helps with appetite at all. Patient notes that bowel movements have been loose and watery. Patient notes that she is not having pain, but she is not happy with where she is.    Currently in Pain?  No/denies       TREATMENT  Neuromuscular Re-education: Supine hooklying diaphragmatic breathing with VCs and TCs for downregulation of the nervous system and improved management of IAP Supine hooklying rotations (open-book) bilaterally with diaphragmatic breathing for improved diaphragmatic and rib cage excursion. VCs and TCs to prevent compensations. Supine hooklying, TrA activation with  exhalation. VCs and TCs to decrease compensatory patterns. Physioball, pelvic tilts for improved lumbopelvic dissociation and postural control. VCs and TCs to decrease compensatory patterns. Supine hooklying, pelvic tilts for improved lumbopelvic dissociation and postural control. VCs and TCs to decrease compensatory patterns.  Patient education on strategies for improved regulation of bowel.    Patient educated throughout session on appropriate technique and form using multi-modal cueing, HEP, and activity modification. Patient articulated understanding and returned demonstration.  Patient Response to interventions: Patient does not report any increase in pain or discomfort with activities.  ASSESSMENT Patient presents to clinic with excellent motivation to participate in therapy. Patient demonstrates deficits in posture, pain, PFM coordination, PFM strength, and PFM extensibility. Patient able to perform supine pelvic tilt with coordinated breath and max VCs and TCs during today's session and responded positively to educational interventions. Based on patient report of increased gag reflex with solid food consumption, patient is likely appropriate for further investigation of feeding deficits. Patient will benefit from continued skilled therapeutic intervention to address remaining deficits in posture, pain, PFM coordination, PFM strength, and PFM extensibility in order to increase function and improve overall QOL.    PT Long Term Goals - 01/05/20 1154      PT LONG TERM GOAL #1   Title  Patient will demonstrate independence with HEP in order to maximize  therapeutic gains and improve carryover from physical therapy sessions to ADLs in the home and community.    Baseline  IE: not initiated; 3/10: 90%    Time  12    Period  Weeks    Status  On-going    Target Date  03/08/20      PT LONG TERM GOAL #2   Title  Patient will demonstrate independent and coordinated diaphragmatic breathing in  supine with a 1:2 breathing pattern for improved down-regulation of the nervous system and improved management of intra-abdominal pressures in order to increase function at home and in the community.    Baseline  IE: not demonstrated; 3/10: requires some cueing    Time  12    Period  Weeks    Status  New    Target Date  03/08/20      PT LONG TERM GOAL #3   Title  Patient will decrease worst pain as reported on NPRS by at least 2 points to demonstrate clinically significant reduction in pain in order to restore/improve function and overall QOL.    Baseline  IE: 10/10 (urethral and rectal); 3/10: 7/10    Time  12    Period  Weeks    Status  On-going    Target Date  03/08/20      PT LONG TERM GOAL #4   Title  Patient will indicate at least a 45 point difference on the PFDI-20 form to demonstrate clinically significant improvement of PFM for a return to PLOF at home and in the community.    Baseline  IE: 202.2/300 (POPDI 66.7; CRADI 68.8; UDI 66.7)    Time  12    Period  Weeks    Status  On-going    Target Date  03/08/20      PT LONG TERM GOAL #5   Title  Patient will indicate at least a 7 point difference on the NIH-CPSI Female form to demonstrate clinically significant improvement in pain severity for improved overall QOL.    Baseline  IE: 35/43 (15 Pain, 8 Urinary, 12 QoL)    Time  12    Period  Weeks    Status  On-going    Target Date  03/08/20            Plan - 01/12/20 1319    Clinical Impression Statement  Patient presents to clinic with excellent motivation to participate in therapy. Patient demonstrates deficits in posture, pain, PFM coordination, PFM strength, and PFM extensibility. Patient able to perform supine pelvic tilt with coordinated breath and max VCs and TCs during today's session and responded positively to educational interventions. Based on patient report of increased gag reflex with solid food consumption, patient is likely appropriate for further  investigation of feeding deficits. Patient will benefit from continued skilled therapeutic intervention to address remaining deficits in posture, pain, PFM coordination, PFM strength, and PFM extensibility in order to increase function and improve overall QOL.    Personal Factors and Comorbidities  Age;Education;Sex;Social Background;Fitness;Time since onset of injury/illness/exacerbation;Behavior Pattern;Past/Current Experience;Comorbidity 1    Comorbidities  bipolar 1, postpartum depression    Examination-Activity Limitations  Continence;Toileting;Caring for Others;Lift;Squat;Stairs;Reach Overhead;Stand;Sit;Transfers    Examination-Participation Restrictions  Interpersonal Relationship;Cleaning;Laundry;Shop;Meal Prep    Stability/Clinical Decision Making  Evolving/Moderate complexity    Rehab Potential  Fair    PT Frequency  1x / week    PT Duration  12 weeks    PT Treatment/Interventions  ADLs/Self Care Home Management;Biofeedback;Moist Heat;Cryotherapy;Electrical Stimulation;Therapeutic activities;Functional mobility training;Stair  training;Gait training;Therapeutic exercise;Balance training;Neuromuscular re-education;Scar mobilization;Manual techniques;Patient/family education;Taping;Orthotic Fit/Training;Dry needling;Passive range of motion;Joint Manipulations;Spinal Manipulations    PT Next Visit Plan  IAP management basics; PFM relaxation    PT Home Exercise Plan  Bowel Retraining    Consulted and Agree with Plan of Care  Patient       Patient will benefit from skilled therapeutic intervention in order to improve the following deficits and impairments:  Abnormal gait, Decreased balance, Decreased mobility, Decreased endurance, Difficulty walking, Increased muscle spasms, Pain, Postural dysfunction, Improper body mechanics, Decreased range of motion, Decreased strength, Increased fascial restricitons, Decreased coordination, Decreased activity tolerance  Visit Diagnosis: Other lack of  coordination  Other muscle spasm  Muscle weakness (generalized)     Problem List Patient Active Problem List   Diagnosis Date Noted  . PTSD (post-traumatic stress disorder) 12/20/2019  . Pelvic floor dysfunction 12/20/2019  . Abdominal pain 12/20/2019  . HSV infection 09/09/2018  . Bipolar 1 disorder ([place]) 09/10/2017   Myles Gip PT, DPT [phone] 01/12/2020, 1:43 PM  [facility] [address] [place], [place], [address] Phone: [phone]   Fax:  [phone]  Name: Chelsea Parks MRN: [id number] Date of Birth: [birth year]

## 2020-01-19 ENCOUNTER — Encounter: Payer: Self-pay | Admitting: Physical Therapy

## 2020-01-19 ENCOUNTER — Ambulatory Visit: Payer: Medicaid Other | Admitting: Physical Therapy

## 2020-01-19 ENCOUNTER — Other Ambulatory Visit: Payer: Self-pay

## 2020-01-19 DIAGNOSIS — M6281 Muscle weakness (generalized): Secondary | ICD-10-CM

## 2020-01-19 DIAGNOSIS — M62838 Other muscle spasm: Secondary | ICD-10-CM | POA: Diagnosis not present

## 2020-01-19 DIAGNOSIS — R278 Other lack of coordination: Secondary | ICD-10-CM | POA: Diagnosis not present

## 2020-01-19 NOTE — Therapy (Signed)
[facility] [address] [place], [place], [address] Phone: [phone]   Fax:  [phone]  Physical Therapy Treatment  Patient Details  Name: MYKAL KIRCHMAN MRN: [id number] Date of Birth: 1996-03-16 Referring Provider (PT): Vanna Scotland   Encounter Date: 01/19/2020  PT End of Session - 01/19/20 1227    Visit Number  6    Number of Visits  13    Date for PT Re-Evaluation  03/08/20    Authorization Type  Medicaid    Authorization - Visit Number  2    Authorization - Number of Visits  12    PT Start Time  1100    PT Stop Time  1155    PT Time Calculation (min)  55 min    Activity Tolerance  Patient tolerated treatment well    Behavior During Therapy  [facility] for tasks assessed/performed       Past Medical History:  Diagnosis Date  . Anxiety   . Depression   . H/O postpartum depression, currently pregnant   . Herpes genitalis in women     Past Surgical History:  Procedure Laterality Date  . CERVICAL BIOPSY    . no surgical history      There were no vitals filed for this visit.  Subjective Assessment - 01/19/20 1104    Subjective  Patient reports that she is feeling better able to perform pelvic tilts and has continued to work on her breathing. She reports she is continuing to struggle with eating solid foods 2/2 to difficulty with swallowing. She reports increased straining for BMs with an urge to empty sometimes 5x/day, but when she attempts to digitally disimpact there is no stool present. She tried Miralax one day and had loose/watery stool.    Currently in Pain?  No/denies      TREATMENT  Neuromuscular Re-education: Supine hooklying diaphragmatic breathing with VCs and TCs for downregulation of the nervous system and improved management of IAP Supine hooklying, pelvic tilts for improved lumbopelvic dissociation and postural control. VCs and TCs to decrease compensatory patterns.  Sidelying, PFM lengthening with  inhalation. VCs and TCs to decrease compensatory patterns and encourage optimal relaxation of the PFM. Patient discussion around strategies to control gag reflex and promote swallow reflex for improved food intake and thereby positively impacting digestion for improved bowel movements.   Patient educated throughout session on appropriate technique and form using multi-modal cueing, HEP, and activity modification. Patient articulated understanding and returned demonstration.  Patient Response to interventions: Patient does not report any increase in pain or discomfort with activities.  ASSESSMENT Patient presents to clinic with excellent motivation to participate in therapy. Patient demonstrates deficits in posture, pain, PFM coordination, PFM strength, and PFM extensibility. Patient able to perform pelvic tilts with greater ease during today's session and responded positively to educational interventions and strategies surrounding food intake/digestion/bowel movments. Patient will benefit from continued skilled therapeutic intervention to address remaining deficits in posture, pain, PFM coordination, PFM strength, and PFM extensibility in order to increase function and improve overall QOL.    PT Long Term Goals - 01/05/20 1154      PT LONG TERM GOAL #1   Title  Patient will demonstrate independence with HEP in order to maximize therapeutic gains and improve carryover from physical therapy sessions to ADLs in the home and community.    Baseline  IE: not initiated; 3/10: 90%    Time  12    Period  Weeks  Status  On-going    Target Date  03/08/20      PT LONG TERM GOAL #2   Title  Patient will demonstrate independent and coordinated diaphragmatic breathing in supine with a 1:2 breathing pattern for improved down-regulation of the nervous system and improved management of intra-abdominal pressures in order to increase function at home and in the community.    Baseline  IE: not demonstrated;  3/10: requires some cueing    Time  12    Period  Weeks    Status  New    Target Date  03/08/20      PT LONG TERM GOAL #3   Title  Patient will decrease worst pain as reported on NPRS by at least 2 points to demonstrate clinically significant reduction in pain in order to restore/improve function and overall QOL.    Baseline  IE: 10/10 (urethral and rectal); 3/10: 7/10    Time  12    Period  Weeks    Status  On-going    Target Date  03/08/20      PT LONG TERM GOAL #4   Title  Patient will indicate at least a 45 point difference on the PFDI-20 form to demonstrate clinically significant improvement of PFM for a return to PLOF at home and in the community.    Baseline  IE: 202.2/300 (POPDI 66.7; CRADI 68.8; UDI 66.7)    Time  12    Period  Weeks    Status  On-going    Target Date  03/08/20      PT LONG TERM GOAL #5   Title  Patient will indicate at least a 7 point difference on the NIH-CPSI Female form to demonstrate clinically significant improvement in pain severity for improved overall QOL.    Baseline  IE: 35/43 (15 Pain, 8 Urinary, 12 QoL)    Time  12    Period  Weeks    Status  On-going    Target Date  03/08/20            Plan - 01/19/20 1227    Clinical Impression Statement  Patient presents to clinic with excellent motivation to participate in therapy. Patient demonstrates deficits in posture, pain, PFM coordination, PFM strength, and PFM extensibility. Patient able to perform pelvic tilts with greater ease during today's session and responded positively to educational interventions and strategies surrounding food intake/digestion/bowel movments. Patient will benefit from continued skilled therapeutic intervention to address remaining deficits in posture, pain, PFM coordination, PFM strength, and PFM extensibility in order to increase function and improve overall QOL.    Personal Factors and Comorbidities  Age;Education;Sex;Social Background;Fitness;Time since onset of  injury/illness/exacerbation;Behavior Pattern;Past/Current Experience;Comorbidity 1    Comorbidities  bipolar 1, postpartum depression    Examination-Activity Limitations  Continence;Toileting;Caring for Others;Lift;Squat;Stairs;Reach Overhead;Stand;Sit;Transfers    Examination-Participation Restrictions  Interpersonal Relationship;Cleaning;Laundry;Shop;Meal Prep    Stability/Clinical Decision Making  Evolving/Moderate complexity    Rehab Potential  Fair    PT Frequency  1x / week    PT Duration  12 weeks    PT Treatment/Interventions  ADLs/Self Care Home Management;Biofeedback;Moist Heat;Cryotherapy;Electrical Stimulation;Therapeutic activities;Functional mobility training;Stair training;Gait training;Therapeutic exercise;Balance training;Neuromuscular re-education;Scar mobilization;Manual techniques;Patient/family education;Taping;Orthotic Fit/Training;Dry needling;Passive range of motion;Joint Manipulations;Spinal Manipulations    PT Next Visit Plan  IAP management basics; PFM relaxation    PT Home Exercise Plan  Bowel Retraining    Consulted and Agree with Plan of Care  Patient       Patient will benefit from skilled therapeutic intervention  in order to improve the following deficits and impairments:  Abnormal gait, Decreased balance, Decreased mobility, Decreased endurance, Difficulty walking, Increased muscle spasms, Pain, Postural dysfunction, Improper body mechanics, Decreased range of motion, Decreased strength, Increased fascial restricitons, Decreased coordination, Decreased activity tolerance  Visit Diagnosis: Other lack of coordination  Other muscle spasm  Muscle weakness (generalized)     Problem List Patient Active Problem List   Diagnosis Date Noted  . PTSD (post-traumatic stress disorder) 12/20/2019  . Pelvic floor dysfunction 12/20/2019  . Abdominal pain 12/20/2019  . HSV infection 09/09/2018  . Bipolar 1 disorder ([place]) 09/10/2017   Myles Gip PT, DPT  [phone] 01/19/2020, 5:09 PM  [facility] [address] [place], [place], [address] Phone: [phone]   Fax:  [phone]  Name: SHAINE MOUNT MRN: [id number] Date of Birth: 30-Jan-1996

## 2020-01-26 ENCOUNTER — Encounter: Payer: Self-pay | Admitting: Urology

## 2020-01-26 ENCOUNTER — Ambulatory Visit (INDEPENDENT_AMBULATORY_CARE_PROVIDER_SITE_OTHER): Payer: Medicaid Other | Admitting: Urology

## 2020-01-26 ENCOUNTER — Telehealth: Payer: Self-pay | Admitting: Urology

## 2020-01-26 ENCOUNTER — Telehealth: Payer: Self-pay | Admitting: Obstetrics and Gynecology

## 2020-01-26 ENCOUNTER — Ambulatory Visit: Payer: Medicaid Other | Admitting: Physical Therapy

## 2020-01-26 ENCOUNTER — Other Ambulatory Visit: Payer: Self-pay

## 2020-01-26 VITALS — BP 108/73 | HR 69 | Ht 62.0 in | Wt 117.0 lb

## 2020-01-26 DIAGNOSIS — R300 Dysuria: Secondary | ICD-10-CM

## 2020-01-26 DIAGNOSIS — R3129 Other microscopic hematuria: Secondary | ICD-10-CM

## 2020-01-26 DIAGNOSIS — N898 Other specified noninflammatory disorders of vagina: Secondary | ICD-10-CM

## 2020-01-26 NOTE — Telephone Encounter (Signed)
Pt called in and left a vm and stated the she went to doctor today and that she needs to come in to do a self swab. I called the pt back and the pt said she doesn't need a self swab she needs to make sure she's not pregnant . Can you look at her chart and see what she needs to come in for because I am unclear. Does she need a self swab or a pregnancy test? Please advise

## 2020-01-26 NOTE — Telephone Encounter (Signed)
Pt called office asking for a same day appt for possible UTI pt c/o odor, burning with urination, feeling the need to urinate after urination. Pt denies fever, chills, low or back pain, nausea and vomiting. Pt added to sched.

## 2020-01-26 NOTE — Progress Notes (Signed)
01/26/2020 4:10 PM   Chelsea Parks 18-Dec-1995 [id number]  Referring provider: Marjie Skiff, NP [address] [place],  [place] [address]  Chief Complaint  Patient presents with  . Urinary Tract Infection    HPI: [name] is a 24 year old female with dysuria/rUTI's and pelvic floor dysfunction who presents today requesting an urgent appointment for odor, feeling to need to urinate after urination and dysuria.   She was seen initially on December 08, 2019 with Dr. Apolinar Junes.  At that appointment, her history was reviewed and lengthy discussion took place regarding her symptoms.  She was also referred on to pelvic floor physical therapy.  She has had six therapy sessions with Kathryne Eriksson, PT.    Today, she is experiencing frequency, urgency, dysuria, urge incontinence, difficulty urinating, a weak urinary stream, low back pain and lower abdominal pain that started four days ago.  She states she feels an intense burning at the end of her urethra and intense pain.  Level 7 out of 10.  She has the constant urge to urinate.  And her urine has a chemical odor.  She is taking AZO which is helping her symptoms.  Patient denies any aggravating factors.  Patient denies any gross hematuria or flank pain.  Patient denies any fevers, chills, nausea or vomiting.   Her CATH UA orange cloudy patient on AZO so dip not completed, microscopically greater than 30 WBCs, 3-10 RBCs, 0-10 epithelial cells, 0-10 renal epithelial cells and moderate bacteria.  Her and her husband are trying to conceive their fourth child.  PMH: Past Medical History:  Diagnosis Date  . Anxiety   . Depression   . H/O postpartum depression, currently pregnant   . Herpes genitalis in women     Surgical History: Past Surgical History:  Procedure Laterality Date  . CERVICAL BIOPSY    . no surgical history      Home Medications:  Allergies as of 01/26/2020   No Known Allergies     Medication List       Accurate as of January 26, 2020 11:59 PM. If you have any questions, ask your nurse or doctor.        STOP taking these medications   AZO URINARY PAIN PO Stopped by: Michiel Cowboy, PA-C     TAKE these medications   CitraNatal B-Calm 20-1 MG & 2 x 25 MG Misc Take 1 tablet by mouth daily.   lamoTRIgine 25 MG tablet Commonly known as: LaMICtal Take 1 tablet (25 mg total) by mouth daily.   polyethylene glycol powder 17 GM/SCOOP powder Commonly known as: GLYCOLAX/MIRALAX Take 17 g by mouth daily.   Probiotic 250 MG Caps Take 1 tablet by mouth 2 (two) times daily.   sertraline 100 MG tablet Commonly known as: ZOLOFT Take 1 tablet (100 mg total) by mouth daily.       Allergies: No Known Allergies  Family History: Family History  Problem Relation Age of Onset  . Hypertension Mother   . Healthy Father   . Pancreatic cancer Maternal Uncle   . Stroke Maternal Grandfather   . Cancer Maternal Grandfather   . Lupus Paternal Aunt   . GER disease Paternal Grandmother   . Kidney cancer Neg Hx     Social History:  reports that she has quit smoking. She has never used smokeless tobacco. She reports previous alcohol use. She reports that she does not use drugs.  ROS: Pertinent ROS in HPI  Physical Exam: BP 108/73   Pulse 69   Ht 5\' 2"  (1.575 m)   Wt 117 lb (53.1 kg)   LMP 12/26/2019 (Approximate)   BMI 21.40 kg/m   Constitutional:  Well nourished. Alert and oriented, No acute distress. HEENT: [place] AT, mask in place.  Trachea midline, no masses. Cardiovascular: No clubbing, cyanosis, or edema. Respiratory: Normal respiratory effort, no increased work of breathing. GI: Abdomen is soft, non tender, non distended, no abdominal masses.  GU: No CVA tenderness.  No bladder fullness or masses.  Normal external genitalia, waxed pubic hair, no lesions.  Normal urethral meatus, no lesions, no prolapse, no discharge.   No urethral masses, tenderness and/or tenderness. No bladder  fullness, tenderness or masses. Pink vagina mucosa, good estrogen effect, whitish discharge, no lesions, good pelvic support, grade I cystocele and no rectocele noted.  No cervical motion tenderness.  Uterus is freely mobile and non-fixed.  No adnexal/parametria masses or tenderness noted.  Anus and perineum are without rashes or lesions.    Skin: No rashes, bruises or suspicious lesions. Lymph: No inguinal adenopathy. Neurologic: Grossly intact, no focal deficits, moving all 4 extremities. Psychiatric: Normal mood and affect.   Laboratory Data: Lab Results  Component Value Date   WBC 9.4 12/20/2019   HGB 13.3 12/20/2019   HCT 38.4 12/20/2019   MCV 90 12/20/2019   PLT 229 12/20/2019   Lab Results  Component Value Date   CREATININE 0.59 12/20/2019   Lab Results  Component Value Date   TSH 0.490 12/20/2019   Lab Results  Component Value Date   AST 12 12/20/2019   Lab Results  Component Value Date   ALT 6 12/20/2019    Urinalysis Component     Latest Ref Rng & Units 01/26/2020  Specific Gravity, UA     1.005 - 1.030 1.020  pH, UA     5.0 - 7.5 7.5  Color, UA     Yellow Orange  Appearance Ur     Clear Cloudy (A)  Leukocytes,UA     Negative 1+ (A)  Protein,UA     Negative/Trace 1+ (A)  Glucose, UA     Negative Negative  Ketones, UA     Negative 1+ (A)  RBC, UA     Negative 1+ (A)  Bilirubin, UA     Negative Negative  Urobilinogen, Ur     0.2 - 1.0 mg/dL 1.0  Nitrite, UA     Negative Negative  Microscopic Examination      See below:   Component     Latest Ref Rng & Units 01/26/2020  WBC, UA     0 - 5 /hpf >30 (A)  RBC     0 - 2 /hpf 3-10 (A)  Epithelial Cells (non renal)     0 - 10 /hpf 0-10  Renal Epithel, UA     None seen /hpf 0-10 (A)  Bacteria, UA     None seen/Few Moderate (A)    I have reviewed the labs.   Pertinent Imaging:  In and Out Catheterization Patient is present today for a I & O catheterization due to contaminated specimen.  Patient was cleaned and prepped in a sterile fashion with betadine . A 14 FR cath was inserted no complications were noted , 10 ml of urine return was noted, urine was yellow in color. A clean urine sample was collected for UA and culture. Bladder was drained  And catheter was removed with out difficulty.  Performed by: Zara Council, PA-C and Kerman Passey, CMA  Assessment & Plan:   1. Dysuria - Urinalysis, Complete - CULTURE, URINE COMPREHENSIVE Likely due to urinary tract infection as cath UA with pyuria, hematuria and bacteriuria We will start an antibiotic empirically, hopefully we can establish pregnancy status today, but if not we will need to use an antibiotic that is safe in pregnancy - PCOT pregnancy test from Dr. Andreas Blower negative - Septra sent to pharmacy - will adjust once culture and sensitivities are available if necessary   2. Vaginal discharge -Explained to the patient that I was not sure if it was physiologic discharge versus an infectious discharge and recommended that she contact her gynecologist  3. Microscopic hematuria Likely due to urinary tract infection, will continue to monitor   Return for pending urine culture .  These notes generated with voice recognition software. I apologize for typographical errors.  Zara Council, PA-C  [facility] [address]  [place], [place] [address] [phone]

## 2020-01-26 NOTE — Telephone Encounter (Signed)
Spoke to patient and she will get the pregnancy test and call our office with the result.

## 2020-01-26 NOTE — Telephone Encounter (Signed)
Patient called earlier and stated she was seen at her urologist and was advised to be seen by GYN. However when I referenced the notes there is no indication of having to follow up with [name]. Patient states she needs to be swabbed when asked for what or why she indicated this was per urology. Unsure how to further address this matter.

## 2020-01-26 NOTE — Telephone Encounter (Signed)
Pt called no answer LM via VM to get more information about what she needed to have done. Advised pt to call back tomorrow and speak to [facility] and inform her on what needs to been and we will follow up. Asher Muir [facility] stated that if pt just needed to self swab or pregnancy test please add her to the lab schedule and do the test.

## 2020-01-26 NOTE — Telephone Encounter (Signed)
Please let Chelsea Parks know that her urine specimen looks like there is an infection.  We should start her on an antibiotic.  If she is seeing Dr. Valentino Saxon today, she should ask if they can do a pregnancy test on her before we prescribe an antibiotic.

## 2020-01-27 ENCOUNTER — Other Ambulatory Visit (INDEPENDENT_AMBULATORY_CARE_PROVIDER_SITE_OTHER): Payer: Medicaid Other

## 2020-01-27 ENCOUNTER — Other Ambulatory Visit: Payer: Self-pay | Admitting: Urology

## 2020-01-27 ENCOUNTER — Encounter: Payer: Self-pay | Admitting: Urology

## 2020-01-27 ENCOUNTER — Telehealth: Payer: Self-pay | Admitting: Obstetrics and Gynecology

## 2020-01-27 DIAGNOSIS — R399 Unspecified symptoms and signs involving the genitourinary system: Secondary | ICD-10-CM

## 2020-01-27 LAB — URINALYSIS, COMPLETE
Bilirubin, UA: NEGATIVE
Glucose, UA: NEGATIVE
Nitrite, UA: NEGATIVE
Specific Gravity, UA: 1.02 (ref 1.005–1.030)
Urobilinogen, Ur: 1 mg/dL (ref 0.2–1.0)
pH, UA: 7.5 (ref 5.0–7.5)

## 2020-01-27 LAB — MICROSCOPIC EXAMINATION: WBC, UA: >30 /hpf — AB (ref 0–5)

## 2020-01-27 LAB — POCT URINE PREGNANCY: Preg Test, Ur: NEGATIVE

## 2020-01-27 MED ORDER — SULFAMETHOXAZOLE-TRIMETHOPRIM 800-160 MG PO TABS: 1.0000 | ORAL_TABLET | Freq: Two times a day (BID) | ORAL | 0 refills | Status: DC

## 2020-01-27 NOTE — Telephone Encounter (Signed)
Patient stated that she was seen by Urology and has an infection. Patient stated that they will not prescribe an ABX until she has a pregnancy test. Scheduled patient to come in to do a UPT at 2 PM.

## 2020-01-27 NOTE — Telephone Encounter (Signed)
Pt called in and stated that she was returning Jamie's call. The pt is requesting a call back. Please advise

## 2020-01-27 NOTE — Telephone Encounter (Signed)
Patient returned call, pregnancy test was negative.

## 2020-01-27 NOTE — Telephone Encounter (Signed)
Left Vm-bactrim sent to [facility]. Lab was performed at Dr. Oretha Milch office.

## 2020-01-27 NOTE — Telephone Encounter (Signed)
I cannot use a home pregnancy test.  I need one from a lab.  Did Chelsea Parks have one at Dr. Oretha Milch?

## 2020-01-29 LAB — CULTURE, URINE COMPREHENSIVE

## 2020-01-31 ENCOUNTER — Telehealth: Payer: Self-pay | Admitting: Family Medicine

## 2020-01-31 NOTE — Telephone Encounter (Signed)
-----   Message from Harle Battiest, PA-C sent at 01/31/2020  7:38 AM EDT ----- Please let Chelsea Parks know that her urine culture was negative for infection.  Was she able to get an an appointment with Dr. Valentino Saxon for her vaginal discharge?

## 2020-01-31 NOTE — Telephone Encounter (Signed)
Spoke to patient and informed her of negative UCX. She has not seen Dr. Valentino Saxon for the discharge. She did go have a urine pregnancy test done. Patient is aware that she does not need to continue the ABX.

## 2020-02-01 ENCOUNTER — Telehealth: Payer: Self-pay | Admitting: Obstetrics and Gynecology

## 2020-02-01 NOTE — Telephone Encounter (Signed)
Patient called in requesting a self swab. Patient stated that she was having UTI symptoms. Patient stated she went to a urologist and was having some discharge?? Placed patient on the schedule for UTI symptoms.

## 2020-02-02 ENCOUNTER — Encounter: Payer: Self-pay | Admitting: Obstetrics and Gynecology

## 2020-02-02 ENCOUNTER — Other Ambulatory Visit: Payer: Self-pay

## 2020-02-02 ENCOUNTER — Ambulatory Visit: Payer: Medicaid Other | Attending: Urology | Admitting: Physical Therapy

## 2020-02-02 ENCOUNTER — Encounter: Payer: Self-pay | Admitting: Physical Therapy

## 2020-02-02 ENCOUNTER — Ambulatory Visit (INDEPENDENT_AMBULATORY_CARE_PROVIDER_SITE_OTHER): Payer: Medicaid Other | Admitting: Obstetrics and Gynecology

## 2020-02-02 VITALS — BP 99/63 | HR 101 | Ht 62.0 in | Wt 110.2 lb

## 2020-02-02 DIAGNOSIS — N898 Other specified noninflammatory disorders of vagina: Secondary | ICD-10-CM

## 2020-02-02 DIAGNOSIS — R278 Other lack of coordination: Secondary | ICD-10-CM | POA: Diagnosis not present

## 2020-02-02 DIAGNOSIS — R399 Unspecified symptoms and signs involving the genitourinary system: Secondary | ICD-10-CM

## 2020-02-02 DIAGNOSIS — N926 Irregular menstruation, unspecified: Secondary | ICD-10-CM

## 2020-02-02 DIAGNOSIS — M6281 Muscle weakness (generalized): Secondary | ICD-10-CM | POA: Insufficient documentation

## 2020-02-02 DIAGNOSIS — M62838 Other muscle spasm: Secondary | ICD-10-CM

## 2020-02-02 DIAGNOSIS — Z30011 Encounter for initial prescription of contraceptive pills: Secondary | ICD-10-CM

## 2020-02-02 NOTE — Progress Notes (Signed)
Pt present for follow up after urology appointment. Pt was informed to return to her GYN for follow up due to vaginal discharge that was seen and possible pregnancy. LMP 12/27/2019. Pt do not use any form of birth control at this time.

## 2020-02-02 NOTE — Therapy (Signed)
[facility] [address] [place], [place], [address] Phone: [phone]   Fax:  [phone]  Physical Therapy Treatment  Patient Details  Name: Chelsea Parks MRN: [id number] Date of Birth: [birth year] Referring Provider (PT): Vanna Scotland   Encounter Date: 02/02/2020  PT End of Session - 02/02/20 1121    Visit Number  7    Number of Visits  13    Date for PT Re-Evaluation  03/08/20    Authorization Type  Medicaid    Authorization - Visit Number  3    Authorization - Number of Visits  12    PT Start Time  1100    PT Stop Time  1155    PT Time Calculation (min)  55 min    Activity Tolerance  Patient tolerated treatment well    Behavior During Therapy  [facility] for tasks assessed/performed       Past Medical History:  Diagnosis Date  . Anxiety   . Depression   . H/O postpartum depression, currently pregnant   . Herpes genitalis in women     Past Surgical History:  Procedure Laterality Date  . CERVICAL BIOPSY    . no surgical history      There were no vitals filed for this visit.  Subjective Assessment - 02/02/20 1106    Subjective  Patient reports severe pain suspicious of UTI last week. Patient reports pain in urethra and has been taking AZO. Patient had Type 7 stool yesterday. Patient notes that she feels some relief in pain with bladder emptying. Patient notes she has gotten better with starting the flow of urine with extreme hip abduction and ER at edge of toilet seat. Patient notes that she is still constipated and gets the urge to empty bowels with nothing comign out besides mucus. Patient notes her appetite is coming back and has been able to eat a variety of foods (cheeseburger, milkshakes, peanut butter). Patient also notes after her conversation with GYN she is concerned for ADHD because of her history of overthinking and being distracted during the day.    Currently in Pain?  Yes    Pain Score  1     Pain Location   Vagina       TREATMENT  Neuromuscular Re-education: Supine hooklying diaphragmatic breathing with VCs and TCs for downregulation of the nervous system and improved management of IAP Hooklying, PFM lengthening with inhalation. VCs and TCs to decrease compensatory patterns and encourage optimal relaxation of the PFM. Happy baby, PFM lengthening with inhalation. VCs and TCs to decrease compensatory patterns and encourage optimal relaxation of the PFM.   Patient educated throughout session on appropriate technique and form using multi-modal cueing, HEP, and activity modification. Patient articulated understanding and returned demonstration.  Patient Response to interventions: Patient does not report any increase in pain or discomfort with activities.  ASSESSMENT Patient presents to clinic with excellent motivation to participate in therapy. Patient demonstrates deficits in posture, pain, PFM coordination, PFM strength, and PFM extensibility. Patient able to perform PFM lengthening with greater ease during today's session and responded positively to educational interventions and strategies for PFM release. Patient will benefit from continued skilled therapeutic intervention to address remaining deficits in posture, pain, PFM coordination, PFM strength, and PFM extensibility in order to increase function and improve overall QOL.    PT Long Term Goals - 01/05/20 1154      PT LONG TERM GOAL #1   Title  Patient will  demonstrate independence with HEP in order to maximize therapeutic gains and improve carryover from physical therapy sessions to ADLs in the home and community.    Baseline  IE: not initiated; 3/10: 90%    Time  12    Period  Weeks    Status  On-going    Target Date  03/08/20      PT LONG TERM GOAL #2   Title  Patient will demonstrate independent and coordinated diaphragmatic breathing in supine with a 1:2 breathing pattern for improved down-regulation of the nervous system and  improved management of intra-abdominal pressures in order to increase function at home and in the community.    Baseline  IE: not demonstrated; 3/10: requires some cueing    Time  12    Period  Weeks    Status  New    Target Date  03/08/20      PT LONG TERM GOAL #3   Title  Patient will decrease worst pain as reported on NPRS by at least 2 points to demonstrate clinically significant reduction in pain in order to restore/improve function and overall QOL.    Baseline  IE: 10/10 (urethral and rectal); 3/10: 7/10    Time  12    Period  Weeks    Status  On-going    Target Date  03/08/20      PT LONG TERM GOAL #4   Title  Patient will indicate at least a 45 point difference on the PFDI-20 form to demonstrate clinically significant improvement of PFM for a return to PLOF at home and in the community.    Baseline  IE: 202.2/300 (POPDI 66.7; CRADI 68.8; UDI 66.7)    Time  12    Period  Weeks    Status  On-going    Target Date  03/08/20      PT LONG TERM GOAL #5   Title  Patient will indicate at least a 7 point difference on the NIH-CPSI Female form to demonstrate clinically significant improvement in pain severity for improved overall QOL.    Baseline  IE: 35/43 (15 Pain, 8 Urinary, 12 QoL)    Time  12    Period  Weeks    Status  On-going    Target Date  03/08/20            Plan - 02/02/20 1122    Clinical Impression Statement  Patient presents to clinic with excellent motivation to participate in therapy. Patient demonstrates deficits in posture, pain, PFM coordination, PFM strength, and PFM extensibility. Patient able to perform PFM lengthening with greater ease during today's session and responded positively to educational interventions and strategies for PFM release. Patient will benefit from continued skilled therapeutic intervention to address remaining deficits in posture, pain, PFM coordination, PFM strength, and PFM extensibility in order to increase function and improve  overall QOL.    Personal Factors and Comorbidities  Age;Education;Sex;Social Background;Fitness;Time since onset of injury/illness/exacerbation;Behavior Pattern;Past/Current Experience;Comorbidity 1    Comorbidities  bipolar 1, postpartum depression    Examination-Activity Limitations  Continence;Toileting;Caring for Others;Lift;Squat;Stairs;Reach Overhead;Stand;Sit;Transfers    Examination-Participation Restrictions  Interpersonal Relationship;Cleaning;Laundry;Shop;Meal Prep    Stability/Clinical Decision Making  Evolving/Moderate complexity    Rehab Potential  Fair    PT Frequency  1x / week    PT Duration  12 weeks    PT Treatment/Interventions  ADLs/Self Care Home Management;Biofeedback;Moist Heat;Cryotherapy;Electrical Stimulation;Therapeutic activities;Functional mobility training;Stair training;Gait training;Therapeutic exercise;Balance training;Neuromuscular re-education;Scar mobilization;Manual techniques;Patient/family education;Taping;Orthotic Fit/Training;Dry needling;Passive range of motion;Joint Manipulations;Spinal Manipulations  PT Next Visit Plan  IAP management basics; PFM relaxation    PT Home Exercise Plan  Bowel Retraining    Consulted and Agree with Plan of Care  Patient       Patient will benefit from skilled therapeutic intervention in order to improve the following deficits and impairments:  Abnormal gait, Decreased balance, Decreased mobility, Decreased endurance, Difficulty walking, Increased muscle spasms, Pain, Postural dysfunction, Improper body mechanics, Decreased range of motion, Decreased strength, Increased fascial restricitons, Decreased coordination, Decreased activity tolerance  Visit Diagnosis: Other lack of coordination  Other muscle spasm  Muscle weakness (generalized)     Problem List Patient Active Problem List   Diagnosis Date Noted  . PTSD (post-traumatic stress disorder) 12/20/2019  . Pelvic floor dysfunction 12/20/2019  . Abdominal  pain 12/20/2019  . HSV infection 09/09/2018  . Bipolar 1 disorder [facility]) 09/10/2017   Myles Gip PT, DPT [phone] 02/02/2020, 12:56 PM  [facility] [address] [place], [place], [address] Phone: [phone]   Fax:  [phone]  Name: Chelsea Parks MRN: [id number] Date of Birth: [birth year]

## 2020-02-02 NOTE — Patient Instructions (Signed)
Ethinyl Estradiol; Norethindrone Acetate; Ferrous fumarate tablets or capsules What is this medicine? ETHINYL ESTRADIOL; NORETHINDRONE ACETATE; FERROUS FUMARATE (ETH in il es tra DYE ole; nor eth IN drone AS e tate; FER us FUE ma rate) is an oral contraceptive. The products combine two types of female hormones, an estrogen and a progestin. They are used to prevent ovulation and pregnancy. Some products are also used to treat acne in females. This medicine may be used for other purposes; ask your health care provider or pharmacist if you have questions. COMMON BRAND NAME(S): Aurovela 24 Fe 1/20, Aurovela Fe, Blisovi 24 Fe, Blisovi Fe, Estrostep Fe, Gildess 24 Fe, Gildess Fe 1.5/30, Gildess Fe 1/20, Hailey 24 Fe, Hailey Fe 1.5/30, Junel Fe 1.5/30, Junel Fe 1/20, Junel Fe 24, Larin Fe, Lo Loestrin Fe, Loestrin 24 Fe, Loestrin FE 1.5/30, Loestrin FE 1/20, Lomedia 24 Fe, Microgestin 24 Fe, Microgestin Fe 1.5/30, Microgestin Fe 1/20, Tarina 24 Fe, Tarina Fe 1/20, Taytulla, Tilia Fe, Tri-Legest Fe What should I tell my health care provider before I take this medicine? They need to know if you have any of these conditions:  abnormal vaginal bleeding  blood vessel disease  breast, cervical, endometrial, ovarian, liver, or uterine cancer  diabetes  gallbladder disease  heart disease or recent heart attack  high blood pressure  high cholesterol  history of blood clots  kidney disease  liver disease  migraine headaches  smoke tobacco  stroke  systemic lupus erythematosus (SLE)  an unusual or allergic reaction to estrogens, progestins, other medicines, foods, dyes, or preservatives  pregnant or trying to get pregnant  breast-feeding How should I use this medicine? Take this medicine by mouth. To reduce nausea, this medicine may be taken with food. Follow the directions on the prescription label. Take this medicine at the same time each day and in the order directed on the package. Do  not take your medicine more often than directed. A patient package insert for the product will be given with each prescription and refill. Read this sheet carefully each time. The sheet may change frequently. Contact your pediatrician regarding the use of this medicine in children. Special care may be needed. This medicine has been used in female children who have started having menstrual periods. Overdosage: If you think you have taken too much of this medicine contact a poison control center or emergency room at once. NOTE: This medicine is only for you. Do not share this medicine with others. What if I miss a dose? If you miss a dose, refer to the patient information sheet you received with your medicine for direction. If you miss more than one pill, this medicine may not be as effective and you may need to use another form of birth control. What may interact with this medicine? Do not take this medicine with the following medication:  dasabuvir; ombitasvir; paritaprevir; ritonavir  ombitasvir; paritaprevir; ritonavir This medicine may also interact with the following medications:  acetaminophen  antibiotics or medicines for infections, especially rifampin, rifabutin, rifapentine, and griseofulvin, and possibly penicillins or tetracyclines  aprepitant  ascorbic acid (vitamin C)  atorvastatin  barbiturate medicines, such as phenobarbital  bosentan  carbamazepine  caffeine  clofibrate  cyclosporine  dantrolene  doxercalciferol  felbamate  grapefruit juice  hydrocortisone  medicines for anxiety or sleeping problems, such as diazepam or temazepam  medicines for diabetes, including pioglitazone  mineral oil  modafinil  mycophenolate  nefazodone  oxcarbazepine  phenytoin  prednisolone  ritonavir or other medicines for   HIV infection or AIDS  rosuvastatin  selegiline  soy isoflavones supplements  St. John's wort  tamoxifen or  raloxifene  theophylline  thyroid hormones  topiramate  warfarin This list may not describe all possible interactions. Give your health care provider a list of all the medicines, herbs, non-prescription drugs, or dietary supplements you use. Also tell them if you smoke, drink alcohol, or use illegal drugs. Some items may interact with your medicine. What should I watch for while using this medicine? Visit your doctor or health care professional for regular checks on your progress. You will need a regular breast and pelvic exam and Pap smear while on this medicine. Use an additional method of contraception during the first cycle that you take these tablets. If you have any reason to think you are pregnant, stop taking this medicine right away and contact your doctor or health care professional. If you are taking this medicine for hormone related problems, it may take several cycles of use to see improvement in your condition. Smoking increases the risk of getting a blood clot or having a stroke while you are taking birth control pills, especially if you are more than 25 years old. You are strongly advised not to smoke. This medicine can make your body retain fluid, making your fingers, hands, or ankles swell. Your blood pressure can go up. Contact your doctor or health care professional if you feel you are retaining fluid. This medicine can make you more sensitive to the sun. Keep out of the sun. If you cannot avoid being in the sun, wear protective clothing and use sunscreen. Do not use sun lamps or tanning beds/booths. If you wear contact lenses and notice visual changes, or if the lenses begin to feel uncomfortable, consult your eye care specialist. In some women, tenderness, swelling, or minor bleeding of the gums may occur. Notify your dentist if this happens. Brushing and flossing your teeth regularly may help limit this. See your dentist regularly and inform your dentist of the medicines you  are taking. If you are going to have elective surgery, you may need to stop taking this medicine before the surgery. Consult your health care professional for advice. This medicine does not protect you against HIV infection (AIDS) or any other sexually transmitted diseases. What side effects may I notice from receiving this medicine? Side effects that you should report to your doctor or health care professional as soon as possible:  allergic reactions like skin rash, itching or hives, swelling of the face, lips, or tongue  breast tissue changes or discharge  changes in vaginal bleeding during your period or between your periods  changes in vision  chest pain  confusion  coughing up blood  dizziness  feeling faint or lightheaded  headaches or migraines  leg, arm or groin pain  loss of balance or coordination  severe or sudden headaches  stomach pain (severe)  sudden shortness of breath  sudden numbness or weakness of the face, arm or leg  symptoms of vaginal infection like itching, irritation or unusual discharge  tenderness in the upper abdomen  trouble speaking or understanding  vomiting  yellowing of the eyes or skin Side effects that usually do not require medical attention (report to your doctor or health care professional if they continue or are bothersome):  breakthrough bleeding and spotting that continues beyond the 3 initial cycles of pills  breast tenderness  mood changes, anxiety, depression, frustration, anger, or emotional outbursts  increased sensitivity to sun   or ultraviolet light  nausea  skin rash, acne, or brown spots on the skin  weight gain (slight) This list may not describe all possible side effects. Call your doctor for medical advice about side effects. You may report side effects to FDA at 1-800-FDA-1088. Where should I keep my medicine? Keep out of the reach of children. Store at room temperature between 15 and 30 degrees C  (59 and 86 degrees F). Throw away any unused medicine after the expiration date. NOTE: This sheet is a summary. It may not cover all possible information. If you have questions about this medicine, talk to your doctor, pharmacist, or health care provider.  2020 Elsevier/Gold Standard (2016-06-24 08:04:41)  

## 2020-02-05 ENCOUNTER — Encounter: Payer: Self-pay | Admitting: Obstetrics and Gynecology

## 2020-02-05 LAB — NUSWAB VAGINITIS PLUS (VG+)
Candida albicans, NAA: NEGATIVE
Candida glabrata, NAA: NEGATIVE
Chlamydia trachomatis, NAA: NEGATIVE
Neisseria gonorrhoeae, NAA: NEGATIVE
Trich vag by NAA: NEGATIVE

## 2020-02-05 NOTE — Progress Notes (Signed)
    GYNECOLOGY PROGRESS NOTE  Subjective:    Patient ID: Chelsea Parks, female    DOB: 06-07-1996, 24 y.o.   MRN: [id number]  HPI  Patient is a 24 y.o. [id number] female who presents for follow up and recurrent UTI symptoms. Was recently seen by Urology who have not found a cause of patient's persistent symptoms. Recently had a UA and culture were negative. Is taking Azo which does help her symptoms some.  Has even been attending pelvic floor physical therapy for the past few months.  Was referred back to GYN due to complaints of vaginal discharge and possible pregnancy.   Patient reports she has had the vaginal discharge for several days. Discharge is increased in amount, thin, white. Denies irritation or burning, or odor.    She also reports that she has not had her menstrual cycle yet this month.  Patient's last menstrual period was 12/27/2019.  However UPT performed last week was negative.   The following portions of the patient's history were reviewed and updated as appropriate: allergies, current medications, past family history, past medical history, past social history, past surgical history and problem list.  Review of Systems Pertinent items noted in HPI and remainder of comprehensive ROS otherwise negative.   Objective:   Blood pressure 99/63, pulse (!) 101, height 5\' 2"  (1.575 m), weight 110 lb 3.2 oz (50 kg), last menstrual period 12/27/2019, not currently breastfeeding. General appearance: alert and no distress Abdomen: soft, non-tender; bowel sounds normal; no masses,  no organomegaly Pelvic: external genitalia normal, rectovaginal septum normal.  Vagina qwith scant thin mucoid discharge.  Cervix normal appearing, no lesions and no motion tenderness. Scant blood noted at cervical os. Uterus mobile, nontender, normal shape and size.  Adnexae non-palpable, nontender bilaterally.    Assessment:   Vaginal discharge Missed menses  UTI symptoms  Plan:   1. Vaginal discharge -  Nuswab performed.  2. Missed menses, recent UPT negative several days ago, Scant blood noted on exam, may be the onset of her cycle. To f/u if cycle does not initiate.  3. Persistent UTI symptoms with only modest relief with Azo and pelvic floor therapy. Urology with otherwise negative workup. Given samples of Urobel. If symptoms persist may need to consider alternative treatments  (I.e. Elavil, Vistaril).  4. Will notify patient of results via Mychart.    02/26/2020, MD [facility]

## 2020-02-09 ENCOUNTER — Encounter: Payer: Self-pay | Admitting: Physical Therapy

## 2020-02-09 ENCOUNTER — Other Ambulatory Visit: Payer: Self-pay

## 2020-02-09 ENCOUNTER — Ambulatory Visit: Payer: Medicaid Other | Admitting: Physical Therapy

## 2020-02-09 DIAGNOSIS — M6281 Muscle weakness (generalized): Secondary | ICD-10-CM

## 2020-02-09 DIAGNOSIS — R278 Other lack of coordination: Secondary | ICD-10-CM

## 2020-02-09 DIAGNOSIS — M62838 Other muscle spasm: Secondary | ICD-10-CM | POA: Diagnosis not present

## 2020-02-09 NOTE — Therapy (Signed)
[facility] [address] [place], [place], [address] Phone: [phone]   Fax:  [phone]  Physical Therapy Treatment  Patient Details  Name: Chelsea Parks MRN: [id number] Date of Birth: Feb 05, 1996 Referring Provider (PT): Hollice Espy   Encounter Date: 02/09/2020  PT End of Session - 02/09/20 1105    Visit Number  8    Number of Visits  13    Date for PT Re-Evaluation  03/08/20    Authorization Type  Medicaid    Authorization - Visit Number  4    Authorization - Number of Visits  12    PT Start Time  1100    PT Stop Time  1155    PT Time Calculation (min)  55 min    Activity Tolerance  Patient tolerated treatment well    Behavior During Therapy  [facility] for tasks assessed/performed       Past Medical History:  Diagnosis Date  . Anxiety   . Depression   . H/O postpartum depression, currently pregnant   . Herpes genitalis in women     Past Surgical History:  Procedure Laterality Date  . CERVICAL BIOPSY    . no surgical history      There were no vitals filed for this visit.  Subjective Assessment - 02/09/20 1102    Subjective  Patient notes that she has not had any loose stools this week; she reports Type 2 stools mostly. She denies any significant changes since last visit. She notes feeling the urge to empty bowels and promptly doing her colon massage which is decreasing her need to strain.    Currently in Pain?  No/denies       TREATMENT  Neuromuscular Re-education: Supine hooklying diaphragmatic breathing with VCs and TCs for downregulation of the nervous system and improved management of IAP Hooklying, PFM lengthening with inhalation. VCs and TCs to decrease compensatory patterns and encourage optimal relaxation of the PFM. Modified Thomas Stretch, BLE with PFM lengthening with inhalation. VCs and TCs to decrease compensatory patterns and encourage optimal relaxation of the PFM. Happy baby, PFM lengthening  with inhalation. VCs and TCs to decrease compensatory patterns and encourage optimal relaxation of the PFM. Modified downward facing dog at counter top with PFM lengthening with inhalation. VCs and TCs to decrease compensatory patterns and encourage optimal relaxation of the PFM.    Patient educated throughout session on appropriate technique and form using multi-modal cueing, HEP, and activity modification. Patient articulated understanding and returned demonstration.  Patient Response to interventions: Patient reports confidence with new HEP.   ASSESSMENT Patient presents to clinic with excellent motivation to participate in therapy. Patient demonstrates deficits in posture, pain, PFM coordination, PFM strength, and PFM extensibility. Patient able to perform PFM lengthening in a variety of positions during today's session and responded positively to active interventions. Patient will benefit from continued skilled therapeutic intervention to address remaining deficits in posture, pain, PFM coordination, PFM strength, and PFM extensibility in order to increase function and improve overall QOL.     PT Long Term Goals - 01/05/20 1154      PT LONG TERM GOAL #1   Title  Patient will demonstrate independence with HEP in order to maximize therapeutic gains and improve carryover from physical therapy sessions to ADLs in the home and community.    Baseline  IE: not initiated; 3/10: 90%    Time  12    Period  Weeks    Status  On-going    Target Date  03/08/20      PT LONG TERM GOAL #2   Title  Patient will demonstrate independent and coordinated diaphragmatic breathing in supine with a 1:2 breathing pattern for improved down-regulation of the nervous system and improved management of intra-abdominal pressures in order to increase function at home and in the community.    Baseline  IE: not demonstrated; 3/10: requires some cueing    Time  12    Period  Weeks    Status  New    Target Date   03/08/20      PT LONG TERM GOAL #3   Title  Patient will decrease worst pain as reported on NPRS by at least 2 points to demonstrate clinically significant reduction in pain in order to restore/improve function and overall QOL.    Baseline  IE: 10/10 (urethral and rectal); 3/10: 7/10    Time  12    Period  Weeks    Status  On-going    Target Date  03/08/20      PT LONG TERM GOAL #4   Title  Patient will indicate at least a 45 point difference on the PFDI-20 form to demonstrate clinically significant improvement of PFM for a return to PLOF at home and in the community.    Baseline  IE: 202.2/300 (POPDI 66.7; CRADI 68.8; UDI 66.7)    Time  12    Period  Weeks    Status  On-going    Target Date  03/08/20      PT LONG TERM GOAL #5   Title  Patient will indicate at least a 7 point difference on the NIH-CPSI Female form to demonstrate clinically significant improvement in pain severity for improved overall QOL.    Baseline  IE: 35/43 (15 Pain, 8 Urinary, 12 QoL)    Time  12    Period  Weeks    Status  On-going    Target Date  03/08/20            Plan - 02/09/20 1109    Clinical Impression Statement  Patient presents to clinic with excellent motivation to participate in therapy. Patient demonstrates deficits in posture, pain, PFM coordination, PFM strength, and PFM extensibility. Patient able to perform PFM lengthening in a variety of positions during today's session and responded positively to active interventions. Patient will benefit from continued skilled therapeutic intervention to address remaining deficits in posture, pain, PFM coordination, PFM strength, and PFM extensibility in order to increase function and improve overall QOL.    Personal Factors and Comorbidities  Age;Education;Sex;Social Background;Fitness;Time since onset of injury/illness/exacerbation;Behavior Pattern;Past/Current Experience;Comorbidity 1    Comorbidities  bipolar 1, postpartum depression     Examination-Activity Limitations  Continence;Toileting;Caring for Others;Lift;Squat;Stairs;Reach Overhead;Stand;Sit;Transfers    Examination-Participation Restrictions  Interpersonal Relationship;Cleaning;Laundry;Shop;Meal Prep    Stability/Clinical Decision Making  Evolving/Moderate complexity    Rehab Potential  Fair    PT Frequency  1x / week    PT Duration  12 weeks    PT Treatment/Interventions  ADLs/Self Care Home Management;Biofeedback;Moist Heat;Cryotherapy;Electrical Stimulation;Therapeutic activities;Functional mobility training;Stair training;Gait training;Therapeutic exercise;Balance training;Neuromuscular re-education;Scar mobilization;Manual techniques;Patient/family education;Taping;Orthotic Fit/Training;Dry needling;Passive range of motion;Joint Manipulations;Spinal Manipulations    PT Next Visit Plan  spinal segmentation    PT Home Exercise Plan  IAP management basics; PFM relaxation    Consulted and Agree with Plan of Care  Patient       Patient will benefit from skilled therapeutic intervention in order to improve the following  deficits and impairments:  Abnormal gait, Decreased balance, Decreased mobility, Decreased endurance, Difficulty walking, Increased muscle spasms, Pain, Postural dysfunction, Improper body mechanics, Decreased range of motion, Decreased strength, Increased fascial restricitons, Decreased coordination, Decreased activity tolerance  Visit Diagnosis: Other lack of coordination  Other muscle spasm  Muscle weakness (generalized)     Problem List Patient Active Problem List   Diagnosis Date Noted  . PTSD (post-traumatic stress disorder) 12/20/2019  . Pelvic floor dysfunction 12/20/2019  . Abdominal pain 12/20/2019  . HSV infection 09/09/2018  . Bipolar 1 disorder (HCC) 09/10/2017   Sheria Lang PT, DPT [id number] 02/09/2020, 12:03 PM  [facility] [address] [place], [place], [address] Phone:  [phone]   Fax:  [phone]  Name: HARNOOR RETA MRN: [id number] Date of Birth: 1996-01-31

## 2020-02-16 ENCOUNTER — Other Ambulatory Visit: Payer: Self-pay

## 2020-02-16 ENCOUNTER — Ambulatory Visit: Payer: Medicaid Other | Admitting: Physical Therapy

## 2020-02-16 ENCOUNTER — Encounter: Payer: Self-pay | Admitting: Physical Therapy

## 2020-02-16 DIAGNOSIS — M6281 Muscle weakness (generalized): Secondary | ICD-10-CM | POA: Diagnosis not present

## 2020-02-16 DIAGNOSIS — M62838 Other muscle spasm: Secondary | ICD-10-CM | POA: Diagnosis not present

## 2020-02-16 DIAGNOSIS — R278 Other lack of coordination: Secondary | ICD-10-CM

## 2020-02-16 NOTE — Therapy (Signed)
[place] [facility] [address] [place], [place], [address] Phone: [phone]   Fax:  [phone]  Physical Therapy Treatment  Patient Details  Name: Chelsea Parks MRN: [id number] Date of Birth: 1995-12-27 Referring Provider (PT): Hollice Espy   Encounter Date: 02/16/2020  PT End of Session - 02/16/20 1111    Visit Number  9    Number of Visits  13    Date for PT Re-Evaluation  03/08/20    Authorization Type  Medicaid    Authorization - Visit Number  5    Authorization - Number of Visits  12    PT Start Time  9242    PT Stop Time  1200    PT Time Calculation (min)  62 min    Activity Tolerance  Patient tolerated treatment well    Behavior During Therapy  [facility] for tasks assessed/performed       Past Medical History:  Diagnosis Date  . Anxiety   . Depression   . H/O postpartum depression, currently pregnant   . Herpes genitalis in women     Past Surgical History:  Procedure Laterality Date  . CERVICAL BIOPSY    . no surgical history      There were no vitals filed for this visit.  Subjective Assessment - 02/16/20 1059    Subjective  Patient reports that she continues to have improvements with BMs. Patient notes difficulty with initial message/pressure to empty doesn't always lead to a BM (>50%). Patient states that if she tries again in an hour/two she is able to empty with a little pushing. Patient notes that she is relying more on breath to allow the BM. Patient reports that she has continued to have increased appetite and ability to consume food without nausea.    Currently in Pain?  No/denies        TREATMENT  Neuromuscular Re-education: Supine hooklying diaphragmatic breathing with VCs and TCs for downregulation of the nervous system and improved management of IAP Hooklying, PFM lengthening with inhalation. VCs and TCs to decrease compensatory patterns and encourage optimal relaxation of the PFM. Supine thoracic  extension with 1/2 foam roll with UE OH reach to improve spinal mobility Supine pelvic tilts on 1/2 foam roll with thoracic extension to improve spinal mobility and PFM extensibility  Standing Pilates postural control: serve a tray, RTB with coordinated breath for improved postural awareness   Patient educated throughout session on appropriate technique and form using multi-modal cueing, HEP, and activity modification. Patient articulated understanding and returned demonstration.  Patient Response to interventions: Patient reports confidence with thoracic extension practice  ASSESSMENT Patient presents to clinic with excellent motivation to participate in therapy. Patient demonstrates deficits in posture, pain, PFM coordination, PFM strength, and PFM extensibility. Patient demonstrating improved postural awareness and control during today's session and responded positively to active interventions. Patient will benefit from continued skilled therapeutic intervention to address remaining deficits in posture, pain, PFM coordination, PFM strength, and PFM extensibility in order to increase function and improve overall QOL.   PT Long Term Goals - 01/05/20 1154      PT LONG TERM GOAL #1   Title  Patient will demonstrate independence with HEP in order to maximize therapeutic gains and improve carryover from physical therapy sessions to ADLs in the home and community.    Baseline  IE: not initiated; 3/10: 90%    Time  12    Period  Weeks    Status  On-going    Target Date  03/08/20      PT LONG TERM GOAL #2   Title  Patient will demonstrate independent and coordinated diaphragmatic breathing in supine with a 1:2 breathing pattern for improved down-regulation of the nervous system and improved management of intra-abdominal pressures in order to increase function at home and in the community.    Baseline  IE: not demonstrated; 3/10: requires some cueing    Time  12    Period  Weeks    Status  New     Target Date  03/08/20      PT LONG TERM GOAL #3   Title  Patient will decrease worst pain as reported on NPRS by at least 2 points to demonstrate clinically significant reduction in pain in order to restore/improve function and overall QOL.    Baseline  IE: 10/10 (urethral and rectal); 3/10: 7/10    Time  12    Period  Weeks    Status  On-going    Target Date  03/08/20      PT LONG TERM GOAL #4   Title  Patient will indicate at least a 45 point difference on the PFDI-20 form to demonstrate clinically significant improvement of PFM for a return to PLOF at home and in the community.    Baseline  IE: 202.2/300 (POPDI 66.7; CRADI 68.8; UDI 66.7)    Time  12    Period  Weeks    Status  On-going    Target Date  03/08/20      PT LONG TERM GOAL #5   Title  Patient will indicate at least a 7 point difference on the NIH-CPSI Female form to demonstrate clinically significant improvement in pain severity for improved overall QOL.    Baseline  IE: 35/43 (15 Pain, 8 Urinary, 12 QoL)    Time  12    Period  Weeks    Status  On-going    Target Date  03/08/20            Plan - 02/16/20 1112    Clinical Impression Statement  Patient presents to clinic with excellent motivation to participate in therapy. Patient demonstrates deficits in posture, pain, PFM coordination, PFM strength, and PFM extensibility. Patient demonstrating improved postural awareness and control during today's session and responded positively to active interventions. Patient will benefit from continued skilled therapeutic intervention to address remaining deficits in posture, pain, PFM coordination, PFM strength, and PFM extensibility in order to increase function and improve overall QOL.    Personal Factors and Comorbidities  Age;Education;Sex;Social Background;Fitness;Time since onset of injury/illness/exacerbation;Behavior Pattern;Past/Current Experience;Comorbidity 1    Comorbidities  bipolar 1, postpartum depression     Examination-Activity Limitations  Continence;Toileting;Caring for Others;Lift;Squat;Stairs;Reach Overhead;Stand;Sit;Transfers    Examination-Participation Restrictions  Interpersonal Relationship;Cleaning;Laundry;Shop;Meal Prep    Stability/Clinical Decision Making  Evolving/Moderate complexity    Rehab Potential  Fair    PT Frequency  1x / week    PT Duration  12 weeks    PT Treatment/Interventions  ADLs/Self Care Home Management;Biofeedback;Moist Heat;Cryotherapy;Electrical Stimulation;Therapeutic activities;Functional mobility training;Stair training;Gait training;Therapeutic exercise;Balance training;Neuromuscular re-education;Scar mobilization;Manual techniques;Patient/family education;Taping;Orthotic Fit/Training;Dry needling;Passive range of motion;Joint Manipulations;Spinal Manipulations    PT Next Visit Plan  spinal segmentation    PT Home Exercise Plan  IAP management basics; PFM relaxation    Consulted and Agree with Plan of Care  Patient       Patient will benefit from skilled therapeutic intervention in order to improve the following deficits and impairments:  Abnormal gait, Decreased balance, Decreased mobility, Decreased endurance, Difficulty walking, Increased muscle spasms, Pain, Postural dysfunction, Improper body mechanics, Decreased range of motion, Decreased strength, Increased fascial restricitons, Decreased coordination, Decreased activity tolerance  Visit Diagnosis: Other muscle spasm  Muscle weakness (generalized)  Other lack of coordination     Problem List Patient Active Problem List   Diagnosis Date Noted  . PTSD (post-traumatic stress disorder) 12/20/2019  . Pelvic floor dysfunction 12/20/2019  . Abdominal pain 12/20/2019  . HSV infection 09/09/2018  . Bipolar 1 disorder (HCC) 09/10/2017   Sheria Lang PT, DPT [phone]  02/16/2020, 2:19 PM  [place] [facility] [address] [place], [place], [address] Phone:  [phone]   Fax:  [phone]  Name: SHAE HINNENKAMP MRN: [id number] Date of Birth: 04/12/1996

## 2020-02-22 DIAGNOSIS — F3132 Bipolar disorder, current episode depressed, moderate: Secondary | ICD-10-CM | POA: Diagnosis not present

## 2020-02-23 ENCOUNTER — Ambulatory Visit: Payer: Medicaid Other | Admitting: Physical Therapy

## 2020-02-23 ENCOUNTER — Encounter: Payer: Self-pay | Admitting: Physical Therapy

## 2020-02-23 ENCOUNTER — Other Ambulatory Visit: Payer: Self-pay

## 2020-02-23 DIAGNOSIS — M62838 Other muscle spasm: Secondary | ICD-10-CM

## 2020-02-23 DIAGNOSIS — R278 Other lack of coordination: Secondary | ICD-10-CM | POA: Diagnosis not present

## 2020-02-23 DIAGNOSIS — M6281 Muscle weakness (generalized): Secondary | ICD-10-CM | POA: Diagnosis not present

## 2020-02-23 NOTE — Therapy (Signed)
[facility] [facility] [address] [place], [place], [address] Phone: [phone]   Fax:  [phone]  Physical Therapy Treatment  Patient Details  Name: Chelsea Parks MRN: [id number] Date of Birth: [birth year] Referring Provider (PT): Vanna Scotland   Encounter Date: 02/23/2020  PT End of Session - 02/23/20 1118    Visit Number  10    Number of Visits  13    Date for PT Re-Evaluation  03/08/20    Authorization Type  Medicaid    Authorization - Visit Number  6    Authorization - Number of Visits  12    PT Start Time  1103    PT Stop Time  1200    PT Time Calculation (min)  57 min    Activity Tolerance  Patient tolerated treatment well    Behavior During Therapy  [facility] for tasks assessed/performed       Past Medical History:  Diagnosis Date  . Anxiety   . Depression   . H/O postpartum depression, currently pregnant   . Herpes genitalis in women     Past Surgical History:  Procedure Laterality Date  . CERVICAL BIOPSY    . no surgical history      There were no vitals filed for this visit.  Subjective Assessment - 02/23/20 1106    Subjective  Patient notes her BMs continue to improve. She notes that her cycle is still regulating with the start of new oral contraceptive. Patient also states that she had to strain to urinate after holding a full bladder for too long. She reports having difficulty releasing her PFM to urinate. Patient notes that she is following up with psychiatry and is excited. Patient reports that she has some tension in her PFM and relates this to some mental health stressors.    Currently in Pain?  No/denies      TREATMENT  Neuromuscular Re-education: Supine hooklying diaphragmatic breathing with VCs and TCs for downregulation of the nervous system and improved management of IAP Hooklying, PFM lengthening with inhalation. VCs and TCs to decrease compensatory patterns and encourage optimal relaxation of the  PFM. Seated thoracic extension over ball with UE OH reach to improve spinal mobility Supine pelvic tilts to improve spinal mobility and PFM extensibility  Supine pelvic lateral tilts to improve spinal mobility and PFM extensibility  Supine pelvic clocks to improve spinal mobility and PFM extensibility  Supine mini bridge with spinal articulation for improved postural awareness and control Wall sit with swimming for postural awareness and to improve spinal mobility and PFM extensibility    Patient educated throughout session on appropriate technique and form using multi-modal cueing, HEP, and activity modification. Patient articulated understanding and returned demonstration.  Patient Response to interventions: Patient reports confidence with HEP  ASSESSMENT Patient presents to clinic with excellent motivation to participate in therapy. Patient demonstrates deficits in posture, pain, PFM coordination, PFM strength, and PFM extensibility. Patient continues to achieve excellent understanding and application of skills and principles related to her care/goals during today's session and responded positively to active interventions. Patient will benefit from continued skilled therapeutic intervention to address remaining deficits in posture, pain, PFM coordination, PFM strength, and PFM extensibility in order to increase function and improve overall QOL.    PT Long Term Goals - 01/05/20 1154      PT LONG TERM GOAL #1   Title  Patient will demonstrate independence with HEP in order to maximize therapeutic gains and improve carryover  from physical therapy sessions to ADLs in the home and community.    Baseline  IE: not initiated; 3/10: 90%    Time  12    Period  Weeks    Status  On-going    Target Date  03/08/20      PT LONG TERM GOAL #2   Title  Patient will demonstrate independent and coordinated diaphragmatic breathing in supine with a 1:2 breathing pattern for improved down-regulation of the  nervous system and improved management of intra-abdominal pressures in order to increase function at home and in the community.    Baseline  IE: not demonstrated; 3/10: requires some cueing    Time  12    Period  Weeks    Status  New    Target Date  03/08/20      PT LONG TERM GOAL #3   Title  Patient will decrease worst pain as reported on NPRS by at least 2 points to demonstrate clinically significant reduction in pain in order to restore/improve function and overall QOL.    Baseline  IE: 10/10 (urethral and rectal); 3/10: 7/10    Time  12    Period  Weeks    Status  On-going    Target Date  03/08/20      PT LONG TERM GOAL #4   Title  Patient will indicate at least a 45 point difference on the PFDI-20 form to demonstrate clinically significant improvement of PFM for a return to PLOF at home and in the community.    Baseline  IE: 202.2/300 (POPDI 66.7; CRADI 68.8; UDI 66.7)    Time  12    Period  Weeks    Status  On-going    Target Date  03/08/20      PT LONG TERM GOAL #5   Title  Patient will indicate at least a 7 point difference on the NIH-CPSI Female form to demonstrate clinically significant improvement in pain severity for improved overall QOL.    Baseline  IE: 35/43 (15 Pain, 8 Urinary, 12 QoL)    Time  12    Period  Weeks    Status  On-going    Target Date  03/08/20            Plan - 02/23/20 1118    Clinical Impression Statement  Patient presents to clinic with excellent motivation to participate in therapy. Patient demonstrates deficits in posture, pain, PFM coordination, PFM strength, and PFM extensibility. Patient continues to achieve excellent understanding and application of skills and principles related to her care/goals during today's session and responded positively to active interventions. Patient will benefit from continued skilled therapeutic intervention to address remaining deficits in posture, pain, PFM coordination, PFM strength, and PFM extensibility in  order to increase function and improve overall QOL.    Personal Factors and Comorbidities  Age;Education;Sex;Social Background;Fitness;Time since onset of injury/illness/exacerbation;Behavior Pattern;Past/Current Experience;Comorbidity 1    Comorbidities  bipolar 1, postpartum depression    Examination-Activity Limitations  Continence;Toileting;Caring for Others;Lift;Squat;Stairs;Reach Overhead;Stand;Sit;Transfers    Examination-Participation Restrictions  Interpersonal Relationship;Cleaning;Laundry;Shop;Meal Prep    Stability/Clinical Decision Making  Evolving/Moderate complexity    Rehab Potential  Fair    PT Frequency  1x / week    PT Duration  12 weeks    PT Treatment/Interventions  ADLs/Self Care Home Management;Biofeedback;Moist Heat;Cryotherapy;Electrical Stimulation;Therapeutic activities;Functional mobility training;Stair training;Gait training;Therapeutic exercise;Balance training;Neuromuscular re-education;Scar mobilization;Manual techniques;Patient/family education;Taping;Orthotic Fit/Training;Dry needling;Passive range of motion;Joint Manipulations;Spinal Manipulations    PT Next Visit Plan  spinal segmentation  PT Home Exercise Plan  IAP management basics; PFM relaxation    Consulted and Agree with Plan of Care  Patient       Patient will benefit from skilled therapeutic intervention in order to improve the following deficits and impairments:  Abnormal gait, Decreased balance, Decreased mobility, Decreased endurance, Difficulty walking, Increased muscle spasms, Pain, Postural dysfunction, Improper body mechanics, Decreased range of motion, Decreased strength, Increased fascial restricitons, Decreased coordination, Decreased activity tolerance  Visit Diagnosis: Other muscle spasm  Muscle weakness (generalized)  Other lack of coordination     Problem List Patient Active Problem List   Diagnosis Date Noted  . PTSD (post-traumatic stress disorder) 12/20/2019  . Pelvic  floor dysfunction 12/20/2019  . Abdominal pain 12/20/2019  . HSV infection 09/09/2018  . Bipolar 1 disorder (HCC) 09/10/2017   Sheria Lang PT, DPT [phone]  02/23/2020, 12:54 PM  [facility] [address] [place], [place], [address] Phone: [phone]   Fax:  [phone]  Name: Chelsea Parks MRN: [id number] Date of Birth: 1996/08/12

## 2020-02-24 ENCOUNTER — Telehealth: Payer: Self-pay | Admitting: Family Medicine

## 2020-02-24 NOTE — Telephone Encounter (Signed)
Spoke to patient and she said she did get the Mychart message and it stated at the end to disregard if her period had already started. She states her period started so she did not think she needed to do it.

## 2020-02-24 NOTE — Telephone Encounter (Signed)
-----   Message from Harle Battiest, PA-C sent at 02/24/2020 10:07 AM EDT ----- Please let Chelsea Parks know that I had sent her a MyChart message on 04/12 regarding the need to recheck her urine for microscopic blood.

## 2020-02-29 ENCOUNTER — Other Ambulatory Visit: Payer: Self-pay | Admitting: Obstetrics and Gynecology

## 2020-03-01 ENCOUNTER — Encounter: Payer: Medicaid Other | Admitting: Physical Therapy

## 2020-03-03 ENCOUNTER — Other Ambulatory Visit: Payer: Self-pay

## 2020-03-06 ENCOUNTER — Other Ambulatory Visit: Payer: Self-pay

## 2020-03-06 MED ORDER — NORETHINDRONE ACET-ETHINYL EST 1.5-30 MG-MCG PO TABS: 1.0000 | ORAL_TABLET | Freq: Every day | ORAL | 3 refills | Status: DC

## 2020-03-07 DIAGNOSIS — F3132 Bipolar disorder, current episode depressed, moderate: Secondary | ICD-10-CM | POA: Diagnosis not present

## 2020-03-08 ENCOUNTER — Ambulatory Visit: Payer: Medicaid Other | Attending: Urology | Admitting: Physical Therapy

## 2020-03-08 ENCOUNTER — Encounter: Payer: Self-pay | Admitting: Physical Therapy

## 2020-03-08 ENCOUNTER — Other Ambulatory Visit: Payer: Self-pay

## 2020-03-08 DIAGNOSIS — M6281 Muscle weakness (generalized): Secondary | ICD-10-CM | POA: Diagnosis not present

## 2020-03-08 DIAGNOSIS — R278 Other lack of coordination: Secondary | ICD-10-CM

## 2020-03-08 DIAGNOSIS — M62838 Other muscle spasm: Secondary | ICD-10-CM | POA: Insufficient documentation

## 2020-03-08 NOTE — Therapy (Signed)
[facility] [address] [place], [place], [address] Phone: [phone]   Fax:  [phone]  Physical Therapy Treatment  Patient Details  Name: Chelsea Parks MRN: [id number] Date of Birth: [birth year] Referring Provider (PT): Vanna Scotland   Encounter Date: 03/08/2020  PT End of Session - 03/08/20 1110    Visit Number  11    Number of Visits  13    Date for PT Re-Evaluation  03/08/20    Authorization Type  Medicaid    Authorization - Visit Number  7    Authorization - Number of Visits  12    PT Start Time  1100    PT Stop Time  1155    PT Time Calculation (min)  55 min    Activity Tolerance  Patient tolerated treatment well    Behavior During Therapy  [facility] for tasks assessed/performed       Past Medical History:  Diagnosis Date  . Anxiety   . Depression   . H/O postpartum depression, currently pregnant   . Herpes genitalis in women     Past Surgical History:  Procedure Laterality Date  . CERVICAL BIOPSY    . no surgical history      There were no vitals filed for this visit.  Subjective Assessment - 03/08/20 1103    Subjective  Patient reports that she had to have a tooth pulled and noted some PFM spasm afterwards. She was prescribed antibiotics which upset her GI system. She feels she was able to use her strategies and techniques to calm her PFM spasm down. She has been feeling overwhelmed with some family stress, but notes she is handling it well. Patient notes that she has had improved eating since having the tooth pulled.    Currently in Pain?  No/denies       TREATMENT  Neuromuscular Re-education: Reassessed goals; see below.  Reviewed strategies and techniques for managing s/s as well as provided resources for maintenance of progress.   Patient educated throughout session on appropriate technique and form using multi-modal cueing, HEP, and activity modification. Patient articulated understanding and  returned demonstration.  Patient Response to interventions: Patient reports confidence with self-management.  ASSESSMENT Patient presents to clinic with excellent motivation to participate in therapy. Patient demonstrates negligible deficits in posture, pain, PFM coordination, PFM strength, and PFM extensibility. Patient has achieved all goals and is able to articulate and apply appropriate strategies for self-management of minimal remaining deficits. Patient is appropriate to discharge to self-management of therapeutic gains made in posture, pain, PFM coordination, PFM strength, and PFM extensibility in order to maintain function and improve overall QOL.    PT Long Term Goals - 03/08/20 1112      PT LONG TERM GOAL #1   Title  Patient will demonstrate independence with HEP in order to maximize therapeutic gains and improve carryover from physical therapy sessions to ADLs in the home and community.    Baseline  IE: not initiated; 3/10: 90%; 5/12: IND    Time  12    Period  Weeks    Status  Achieved      PT LONG TERM GOAL #2   Title  Patient will demonstrate independent and coordinated diaphragmatic breathing in supine with a 1:2 breathing pattern for improved down-regulation of the nervous system and improved management of intra-abdominal pressures in order to increase function at home and in the community.    Baseline  IE: not demonstrated; 3/10: requires  some cueing; 5/12: IND    Time  12    Period  Weeks    Status  Achieved      PT LONG TERM GOAL #3   Title  Patient will decrease worst pain as reported on NPRS by at least 2 points to demonstrate clinically significant reduction in pain in order to restore/improve function and overall QOL.    Baseline  IE: 10/10 (urethral and rectal); 3/10: 7/10; 5/12: 6-7/10 (but notes it was annoying, not hopeless) with strategies managed to 3-4/10    Time  12    Period  Weeks    Status  Achieved      PT LONG TERM GOAL #4   Title  Patient will  indicate at least a 45 point difference on the PFDI-20 form to demonstrate clinically significant improvement of PFM for a return to PLOF at home and in the community.    Baseline  IE: 202.2/300 (POPDI 66.7; CRADI 68.8; UDI 66.7); 5/12: 0/300    Time  12    Period  Weeks    Status  Achieved      PT LONG TERM GOAL #5   Title  Patient will indicate at least a 7 point difference on the NIH-CPSI Female form to demonstrate clinically significant improvement in pain severity for improved overall QOL.    Baseline  IE: 35/43 (15 Pain, 8 Urinary, 12 QoL); 5/12: 5/43 (2 Pain, 1 Urinary, 2 QoL)    Time  12    Period  Weeks    Status  Achieved            Plan - 03/08/20 1112    Clinical Impression Statement  Patient presents to clinic with excellent motivation to participate in therapy. Patient demonstrates negligible deficits in posture, pain, PFM coordination, PFM strength, and PFM extensibility. Patient has achieved all goals and is able to articulate and apply appropriate strategies for self-management of minimal remaining deficits. Patient is appropriate to discharge to self-management of therapeutic gains made in posture, pain, PFM coordination, PFM strength, and PFM extensibility in order to maintain function and improve overall QOL.    Personal Factors and Comorbidities  Age;Education;Sex;Social Background;Fitness;Time since onset of injury/illness/exacerbation;Behavior Pattern;Past/Current Experience;Comorbidity 1    Comorbidities  bipolar 1, postpartum depression    Examination-Activity Limitations  Continence;Toileting;Caring for Others;Lift;Squat;Stairs;Reach Overhead;Stand;Sit;Transfers    Examination-Participation Restrictions  Interpersonal Relationship;Cleaning;Laundry;Shop;Meal Prep    Stability/Clinical Decision Making  Evolving/Moderate complexity    Rehab Potential  Fair    PT Frequency  1x / week    PT Duration  12 weeks    PT Treatment/Interventions  ADLs/Self Care Home  Management;Biofeedback;Moist Heat;Cryotherapy;Electrical Stimulation;Therapeutic activities;Functional mobility training;Stair training;Gait training;Therapeutic exercise;Balance training;Neuromuscular re-education;Scar mobilization;Manual techniques;Patient/family education;Taping;Orthotic Fit/Training;Dry needling;Passive range of motion;Joint Manipulations;Spinal Manipulations    PT Next Visit Plan  --    PT Home Exercise Plan  --    Consulted and Agree with Plan of Care  Patient       Patient will benefit from skilled therapeutic intervention in order to improve the following deficits and impairments:  Abnormal gait, Decreased balance, Decreased mobility, Decreased endurance, Difficulty walking, Increased muscle spasms, Pain, Postural dysfunction, Improper body mechanics, Decreased range of motion, Decreased strength, Increased fascial restricitons, Decreased coordination, Decreased activity tolerance  Visit Diagnosis: Other muscle spasm  Muscle weakness (generalized)  Other lack of coordination     Problem List Patient Active Problem List   Diagnosis Date Noted  . PTSD (post-traumatic stress disorder) 12/20/2019  . Pelvic floor  dysfunction 12/20/2019  . Abdominal pain 12/20/2019  . HSV infection 09/09/2018  . Bipolar 1 disorder (HCC) 09/10/2017   Sheria Lang PT, DPT [phone] 03/08/2020, 1:08 PM  [place] [facility] [address] [place], [place], [address] Phone: [phone]   Fax:  [phone]  Name: Chelsea Parks MRN: [id number] Date of Birth: 1996-06-20

## 2020-03-10 ENCOUNTER — Telehealth: Payer: Self-pay | Admitting: Family Medicine

## 2020-03-10 NOTE — Telephone Encounter (Signed)
LMOM for patient to return call to schedule a lab appointment for UA, when she is not having a period.

## 2020-03-10 NOTE — Telephone Encounter (Signed)
-----   Message from Harle Battiest, PA-C sent at 03/08/2020  9:58 AM EDT ----- Please let Chelsea Parks know that I would like to recheck her urine for microscopic blood when she is not having her period.

## 2020-03-13 DIAGNOSIS — F3132 Bipolar disorder, current episode depressed, moderate: Secondary | ICD-10-CM | POA: Diagnosis not present

## 2020-03-13 NOTE — Telephone Encounter (Signed)
2nd attempt, LMOM for patient to return call.  

## 2020-03-14 ENCOUNTER — Encounter: Payer: Self-pay | Admitting: Family Medicine

## 2020-03-14 NOTE — Telephone Encounter (Signed)
Mychart message sent for patient to schedule appointment for a urine lab.

## 2020-03-15 ENCOUNTER — Ambulatory Visit: Payer: Medicaid Other | Admitting: Nurse Practitioner

## 2020-03-15 ENCOUNTER — Encounter: Payer: Medicaid Other | Admitting: Physical Therapy

## 2020-03-21 DIAGNOSIS — F3132 Bipolar disorder, current episode depressed, moderate: Secondary | ICD-10-CM | POA: Diagnosis not present

## 2020-03-23 ENCOUNTER — Ambulatory Visit (INDEPENDENT_AMBULATORY_CARE_PROVIDER_SITE_OTHER): Payer: Medicaid Other | Admitting: Family Medicine

## 2020-03-23 ENCOUNTER — Other Ambulatory Visit: Payer: Self-pay

## 2020-03-23 ENCOUNTER — Encounter: Payer: Self-pay | Admitting: Family Medicine

## 2020-03-23 VITALS — BP 119/74 | HR 93 | Temp 98.9°F | Ht 62.8 in | Wt 104.4 lb

## 2020-03-23 DIAGNOSIS — M6289 Other specified disorders of muscle: Secondary | ICD-10-CM

## 2020-03-23 DIAGNOSIS — M533 Sacrococcygeal disorders, not elsewhere classified: Secondary | ICD-10-CM | POA: Diagnosis not present

## 2020-03-23 DIAGNOSIS — S300XXA Contusion of lower back and pelvis, initial encounter: Secondary | ICD-10-CM | POA: Diagnosis not present

## 2020-03-23 DIAGNOSIS — S3219XA Other fracture of sacrum, initial encounter for closed fracture: Secondary | ICD-10-CM | POA: Diagnosis not present

## 2020-03-23 NOTE — Assessment & Plan Note (Signed)
Concern for normal variant on her x-ray coupled with her pelvic floor dysfunction, recent delivery, large baby and petite frame. Discussed getting her back into PT for 2nd opinion and monitoring. Donut if sitting on her tailbone- try to sit on sitbones instead. Ibuprofen as needed. Call with any concerns. Recheck in a few weeks.

## 2020-03-23 NOTE — Assessment & Plan Note (Signed)
Chronic. Would like 2nd opinion with PT. New referral generated today.

## 2020-03-23 NOTE — Progress Notes (Signed)
BP 119/74 (BP Location: Right Arm, Patient Position: Sitting, Cuff Size: Normal)   Pulse 93   Temp 98.9 F (37.2 C) (Oral)   Ht 5' 2.8" (1.595 m)   Wt 104 lb 6.4 oz (47.4 kg)   LMP 03/16/2020 (Exact Date)   SpO2 99%   BMI 18.61 kg/m    Subjective:    Patient ID: Chelsea Parks, female    DOB: 1996/01/22, 24 y.o.   MRN: [id number]  HPI: Chelsea Parks is a 24 y.o. female  Chief Complaint  Patient presents with  . Tailbone Pain   Presents today saying that she's mentally and physically broken. She notes that [facility] went [facility]. She has been having pain in her tailbone for about a month. At that time she hit it on a children's slide. She had an [facility]ray at [facility] and sent her to ortho who were then going to send her to a surgeon due to an anterior angulation of her sacrococcygeal junction. She was then told to cancel the appointment with the surgeon and to come here. She is anxious about this. Recently diagnosed with bipolar 1 and dealing with that as well. Zykiria notes that she is very petite and has had 3 babies in the last 5 years. She notes that all of them were vaginal deliveries. Her labors were all 30+ hours. Her last baby is 66 months old now and was a fast delivery. She was also a 9lb baby. She notes that since her birth she has been pushing herself and moving a lot in her house in part due to the bipolar. Her other children were both almost 8lbs. She has been having pain in her tailbone, worse with pressure or sitting on it. Better with ibuprofen. She did pelvic PT previously for pelvic floor dysfunction, but did not have any internal exams and would like to get a 2nd opinion. She is otherwise feeling OK. She has been constipated and having some weight loss, but is wondering if that is due to her pelvic floor dysfunction. No other concerns or complaints at this time.   Relevant past medical, surgical, family and social history reviewed and updated as indicated. Interim  medical history since our last visit reviewed. Allergies and medications reviewed and updated.  Review of Systems  Constitutional: Negative.   Respiratory: Negative.   Cardiovascular: Negative.   Musculoskeletal: Positive for back pain. Negative for arthralgias, gait problem, joint swelling, myalgias, neck pain and neck stiffness.  Skin: Negative.   Neurological: Negative.   Psychiatric/Behavioral: Negative.     Per HPI unless specifically indicated above     Objective:    BP 119/74 (BP Location: Right Arm, Patient Position: Sitting, Cuff Size: Normal)   Pulse 93   Temp 98.9 F (37.2 C) (Oral)   Ht 5' 2.8" (1.595 m)   Wt 104 lb 6.4 oz (47.4 kg)   LMP 03/16/2020 (Exact Date)   SpO2 99%   BMI 18.61 kg/m   Wt Readings from Last 3 Encounters:  03/23/20 104 lb 6.4 oz (47.4 kg)  02/02/20 110 lb 3.2 oz (50 kg)  01/26/20 117 lb (53.1 kg)    Physical Exam Vitals and nursing note reviewed.  Constitutional:      General: She is not in acute distress.    Appearance: Normal appearance. She is not ill-appearing, toxic-appearing or diaphoretic.  HENT:     Head: Normocephalic and atraumatic.     Right Ear: External ear normal.  Left Ear: External ear normal.     Nose: Nose normal.     Mouth/Throat:     Mouth: Mucous membranes are moist.     Pharynx: Oropharynx is clear.  Eyes:     General: No scleral icterus.       Right eye: No discharge.        Left eye: No discharge.     Extraocular Movements: Extraocular movements intact.     Conjunctiva/sclera: Conjunctivae normal.     Pupils: Pupils are equal, round, and reactive to light.  Cardiovascular:     Rate and Rhythm: Normal rate and regular rhythm.     Pulses: Normal pulses.     Heart sounds: Normal heart sounds. No murmur. No friction rub. No gallop.   Pulmonary:     Effort: Pulmonary effort is normal. No respiratory distress.     Breath sounds: Normal breath sounds. No stridor. No wheezing, rhonchi or rales.  Chest:      Chest wall: No tenderness.  Musculoskeletal:        General: Tenderness and deformity present. Normal range of motion.     Cervical back: Normal range of motion and neck supple.     Comments: Palpable sacrococcygeal junction, very prominent. Mildly tender to palpation.   Skin:    General: Skin is warm and dry.     Capillary Refill: Capillary refill takes less than 2 seconds.     Coloration: Skin is not jaundiced or pale.     Findings: No bruising, erythema, lesion or rash.  Neurological:     General: No focal deficit present.     Mental Status: She is alert and oriented to person, place, and time. Mental status is at baseline.  Psychiatric:        Mood and Affect: Mood normal.        Behavior: Behavior normal.        Thought Content: Thought content normal.        Judgment: Judgment normal.     Anterior angulation at the sacrococcygeal junction, normal variant or possibly due to trauma undetermined age  Electronically signed by: Guadalupe Maple, M. D. on 03/23/2020 11:18:25  Result Narrative  Exam Description: Multiple Xray views of the sacrum  Comparison: None provided.  Findings There is no soft tissue abnormality present There is no evidence of acute fracture.  The SI joints appear unremarkable  Anterior angulation at the sacrococcygeal junction, normal variant or possibly due to trauma undetermined age  Other Result Information  Interface, Ris Results In - 03/23/2020 12:27 PM EDT Formatting of this note might be different from the original. Exam Description: Multiple Xray views of the sacrum  Comparison: None provided.  Findings There is no soft tissue abnormality present There is no evidence of acute fracture.  The SI joints appear unremarkable  Anterior angulation at the sacrococcygeal junction, normal variant or possibly due to trauma undetermined age  IMPRESSION:  Anterior angulation at the sacrococcygeal junction, normal variant or possibly due to  trauma undetermined age     Results for orders placed or performed in visit on 02/02/20  NuSwab Vaginitis Plus (VG+)  Result Value Ref Range   Atopobium vaginae Low - 0 Score   BVAB 2 Low - 0 Score   Megasphaera 1 Low - 0 Score   Candida albicans, NAA Negative Negative   Candida glabrata, NAA Negative Negative   Trich vag by NAA Negative Negative   Chlamydia trachomatis, NAA Negative Negative  Neisseria gonorrhoeae, NAA Negative Negative      Assessment & Plan:   Problem List Items Addressed This Visit      Musculoskeletal and Integument   Pelvic floor dysfunction    Chronic. Would like 2nd opinion with PT. New referral generated today.      Relevant Orders   Ambulatory referral to Physical Therapy     Other   Coccyx pain - Primary    Concern for normal variant on her x-ray coupled with her pelvic floor dysfunction, recent delivery, large baby and petite frame. Discussed getting her back into PT for 2nd opinion and monitoring. Donut if sitting on her tailbone- try to sit on sitbones instead. Ibuprofen as needed. Call with any concerns. Recheck in a few weeks.       Relevant Medications   ibuprofen (ADVIL) 600 MG tablet   Other Relevant Orders   Ambulatory referral to Physical Therapy       Follow up plan: Return in about 4 weeks (around 04/20/2020) for with Jolene.

## 2020-03-29 ENCOUNTER — Encounter: Payer: Self-pay | Admitting: Urology

## 2020-03-31 DIAGNOSIS — M533 Sacrococcygeal disorders, not elsewhere classified: Secondary | ICD-10-CM | POA: Diagnosis not present

## 2020-03-31 DIAGNOSIS — M5418 Radiculopathy, sacral and sacrococcygeal region: Secondary | ICD-10-CM | POA: Diagnosis not present

## 2020-04-04 DIAGNOSIS — M533 Sacrococcygeal disorders, not elsewhere classified: Secondary | ICD-10-CM | POA: Diagnosis not present

## 2020-04-04 DIAGNOSIS — S3993XA Unspecified injury of pelvis, initial encounter: Secondary | ICD-10-CM | POA: Diagnosis not present

## 2020-04-07 NOTE — Telephone Encounter (Signed)
This patient called and left a voicemail and stated that she has been receiving messages from [name] and looks like she got a letter from [name] asking her to come back in to have her urine rechecked. She said that she had blood in her urine because she started her period the day after her visit and she wants [name] to stop messaging her because she is not coming back in.   Chelsea Parks

## 2020-04-10 DIAGNOSIS — F3132 Bipolar disorder, current episode depressed, moderate: Secondary | ICD-10-CM | POA: Diagnosis not present

## 2020-04-11 DIAGNOSIS — F3132 Bipolar disorder, current episode depressed, moderate: Secondary | ICD-10-CM | POA: Diagnosis not present

## 2020-04-19 ENCOUNTER — Ambulatory Visit (INDEPENDENT_AMBULATORY_CARE_PROVIDER_SITE_OTHER): Payer: Medicaid Other | Admitting: Nurse Practitioner

## 2020-04-19 ENCOUNTER — Encounter: Payer: Self-pay | Admitting: Nurse Practitioner

## 2020-04-19 ENCOUNTER — Other Ambulatory Visit: Payer: Self-pay

## 2020-04-19 VITALS — BP 111/77 | HR 88 | Temp 98.4°F | Wt 102.2 lb

## 2020-04-19 DIAGNOSIS — M533 Sacrococcygeal disorders, not elsewhere classified: Secondary | ICD-10-CM | POA: Diagnosis not present

## 2020-04-19 DIAGNOSIS — F319 Bipolar disorder, unspecified: Secondary | ICD-10-CM | POA: Diagnosis not present

## 2020-04-19 NOTE — Assessment & Plan Note (Signed)
Ongoing with some improvement, less tenderness.  Continue to collaborate with neurosurgery, recent notes reviewed.  Recommend she try taking Tylenol or Ibuprofen at home as needed for discomfort.  Continue doing daily stretches.  Utilize ice and heat as needed.  Return in 9 weeks.

## 2020-04-19 NOTE — Patient Instructions (Signed)
Tailbone Injury  The tailbone (coccyx) is the small bone at the lower end of the spine. A tailbone injury may involve stretched ligaments, bruising, or a broken bone (fracture). Tailbone injuries can be painful, and some may take a long time to heal. What are the causes? This condition may be caused by:  Falling and landing on the tailbone.  Repeated strain or friction from sitting for long periods of time. This may include actions such as rowing and bicycling.  Childbirth. In some cases, the cause may not be known. What are the signs or symptoms? Symptoms of this condition include:  Pain in the tailbone area or lower back, especially when sitting.  Pain or difficulty when standing up from a sitting position.  Bruising or swelling in the tailbone area.  Painful bowel movements.  In women, pain during intercourse. How is this diagnosed? This condition may be diagnosed based on:  Your symptoms.  A physical exam. If your health care provider suspects a fracture, you may have additional tests, such as:  X-rays.  CT scan.  MRI. How is this treated? Most tailbone injuries heal on their own in 4-6 weeks. However, recovery time may be longer if the injury involves a fracture. Treatment for this condition may include:  NSAIDs or other over-the-counter medicines to help relieve your pain.  Using a large, rubber or inflated ring or cushion to take pressure off the tailbone when sitting.  Physical therapy.  Injecting the tailbone area with local anesthesia and steroid medicine. This is not normally needed unless the pain does not improve over time with over-the-counter pain medicines. Follow these instructions at home: Activity  Avoid sitting for long periods of time.  To prevent repeating an injury that is caused by strain or friction: ? Wear appropriate padding and sports gear when bicycling and rowing.  Increase your activity as the pain allows. Perform any exercises  that are recommended by your health care provider or physical therapist. Managing pain, stiffness, and swelling  To help decrease discomfort when sitting: ? Sit on your rubber or inflated ring or cushion as told by your health care provider. ? Lean forward when you sit.  If directed, apply ice to the injured area: ? Put ice in a plastic bag. ? Place a towel between your skin and the bag. ? Leave the ice on for 20 minutes, 2-3 times per day for the first 1-2 days.  If directed, apply heat to the affected area as often as told by your health care provider. Use the heat source that your health care provider recommends, such as a moist heat pack or a heating pad. ? Place a towel between your skin and the heat source. ? Leave the heat on for 20-30 minutes. ? Remove the heat if your skin turns bright red. This is especially important if you are unable to feel pain, heat, or cold. You may have a greater risk of getting burned. General instructions  Take over-the-counter and prescription medicines only as told by your health care provider.  To prevent or treat constipation or painful bowel movements, your health care provider may recommend that you: ? Drink enough fluid to keep your urine pale yellow. ? Eat foods that are high in fiber, such as fresh fruits and vegetables, whole grains, and beans. ? Limit foods that are high in fat and processed sugars, such as fried and sweet foods. ? Take an over-the-counter or prescription medicine for constipation.  Keep all follow-up visits as   directed by your health care provider. This is important. Contact a health care provider if:  Your pain becomes worse or is not controlled with medicine.  Your bowel movements cause a great deal of discomfort.  You are unable to have a bowel movement after 4 days.  You have pain during intercourse. Summary  A tailbone injury may involve stretched ligaments, bruising, or a broken bone (fracture).  Tailbone  injuries can be painful. Most heal on their own in 4-6 weeks.  Treatment may include taking NSAIDs, using a rubber or inflated ring or cushion when sitting, and physical therapy.  Follow any recommendations from your health care provider to prevent or treat constipation. This information is not intended to replace advice given to you by your health care provider. Make sure you discuss any questions you have with your health care provider. Document Revised: 11/11/2017 Document Reviewed: 11/11/2017 Elsevier Patient Education  2020 [employer]  

## 2020-04-19 NOTE — Progress Notes (Signed)
BP 111/77   Pulse 88 Comment: apical  Temp 98.4 F (36.9 C) (Oral)   Wt 102 lb 3.2 oz (46.4 kg)   LMP  (LMP Unknown)   SpO2 98%   BMI 18.22 kg/m    Subjective:    Patient ID: Chelsea Parks, female    DOB: 12/20/1995, 24 y.o.   MRN: [id number]  HPI: Chelsea Parks is a 24 y.o. female  Chief Complaint  Patient presents with  . Pain    pt states she is still having pain. States she feels like it is moving up into her back and neck    COCCYX PAIN Was seen on 03/23/20 for coccyx pain, did go to [facility] neurosurgery on 03/31/2020 and diagnosed with radicular pain of sacrum -- they recommended Voltaren and ordered a CT scan of pelvis.  CT performed on 04/04/20 and noted no fracture or bone abnormality, an acute anterior angulation at the sacrococcygeal junction was noted, nearly 70 degrees.  An MRI was ordered by neurosurgery on 04/11/2020.  She returns to see neurosurgery on August 18th. Reports that she is no longer having tenderness to touch to coccyx, but does continue to have mild discomfort -- no medicines taken at home.  Reports she has been having really bad anxiety with this, is going to [facility] for psychiatry for this -- currently taking Abilify 15 MG daily.  She stopped taking Zoloft 100 MG because she feels like this decreased her appetite -- has been off of this time one month -- wishes to go back on this due to her increased anxiety with pain.  Sees psychiatry next month, July.  Does talk therapy every 2 weeks with them.  Has taken Lamictal before -- this worked well for her.   Duration: months Mechanism of injury: childbirth -- has history of larger babies Location: coccyx Onset: sudden Severity: mild  Quality: dull and aching Frequency: constant -- has had numbness to legs on occasion Aggravating factors: movement Alleviating factors: rest and laying Status: stable Treatments attempted: rest, APAP and ibuprofen  Relief with NSAIDs?: No NSAIDs Taken Nighttime pain:   no Paresthesias / decreased sensation:  no Bowel / bladder incontinence:  no Fevers:  no Dysuria / urinary frequency:  no  Relevant past medical, surgical, family and social history reviewed and updated as indicated. Interim medical history since our last visit reviewed. Allergies and medications reviewed and updated.  Review of Systems  Constitutional: Negative for activity change, appetite change, diaphoresis, fatigue and fever.  Respiratory: Negative for cough, chest tightness and shortness of breath.   Cardiovascular: Negative for chest pain, palpitations and leg swelling.  Musculoskeletal: Positive for arthralgias.  Neurological: Negative.   Psychiatric/Behavioral: Positive for decreased concentration and sleep disturbance. Negative for self-injury and suicidal ideas. The patient is nervous/anxious.     Per HPI unless specifically indicated above     Objective:    BP 111/77   Pulse 88 Comment: apical  Temp 98.4 F (36.9 C) (Oral)   Wt 102 lb 3.2 oz (46.4 kg)   LMP  (LMP Unknown)   SpO2 98%   BMI 18.22 kg/m   Wt Readings from Last 3 Encounters:  04/19/20 102 lb 3.2 oz (46.4 kg)  03/23/20 104 lb 6.4 oz (47.4 kg)  02/02/20 110 lb 3.2 oz (50 kg)    Physical Exam Vitals and nursing note reviewed.  Constitutional:      General: She is awake. She is not in acute distress.    Appearance:  She is well-developed and well-groomed. She is not ill-appearing.  HENT:     Head: Normocephalic.     Right Ear: Hearing normal.     Left Ear: Hearing normal.  Eyes:     General: Lids are normal.        Right eye: No discharge.        Left eye: No discharge.     Conjunctiva/sclera: Conjunctivae normal.     Pupils: Pupils are equal, round, and reactive to light.  Neck:     Thyroid: No thyromegaly.     Vascular: No carotid bruit.  Cardiovascular:     Rate and Rhythm: Normal rate and regular rhythm.     Heart sounds: Normal heart sounds. No murmur heard.  No gallop.   Pulmonary:      Effort: Pulmonary effort is normal. No accessory muscle usage or respiratory distress.     Breath sounds: Normal breath sounds.  Abdominal:     General: Bowel sounds are normal.     Palpations: Abdomen is soft.  Musculoskeletal:     Cervical back: Normal range of motion and neck supple.     Lumbar back: Normal.     Right lower leg: No edema.     Left lower leg: No edema.     Comments: Prominent coccyx, no tenderness on palpation.  Full ROM lower back present.  No acute distress or discomfort.  Skin:    General: Skin is warm and dry.  Neurological:     Mental Status: She is alert and oriented to person, place, and time.  Psychiatric:        Attention and Perception: Attention normal.        Mood and Affect: Mood normal.        Speech: Speech normal.        Behavior: Behavior normal. Behavior is cooperative.        Thought Content: Thought content normal.     Results for orders placed or performed in visit on 02/02/20  NuSwab Vaginitis Plus (VG+)  Result Value Ref Range   Atopobium vaginae Low - 0 Score   BVAB 2 Low - 0 Score   Megasphaera 1 Low - 0 Score   Candida albicans, NAA Negative Negative   Candida glabrata, NAA Negative Negative   Trich vag by NAA Negative Negative   Chlamydia trachomatis, NAA Negative Negative   Neisseria gonorrhoeae, NAA Negative Negative      Assessment & Plan:   Problem List Items Addressed This Visit      Other   Bipolar 1 disorder (HCC) - Primary    Chronic, ongoing.  Continue current medication regimen and adjust as needed -- recommend she return to taking her Zoloft as was instructed -- may start out with 1/2 tablet (50 MG) and increase to 100 MG in one week.  Continue collaboration with psychiatry at [facility] and therapy.  Denies SI/HI.      Coccyx pain    Ongoing with some improvement, less tenderness.  Continue to collaborate with neurosurgery, recent notes reviewed.  Recommend she try taking Tylenol or Ibuprofen at home as needed for  discomfort.  Continue doing daily stretches.  Utilize ice and heat as needed.  Return in 9 weeks.          Follow up plan: Return in about 9 weeks (around 06/21/2020) for Coccyx pain and mood.

## 2020-04-19 NOTE — Assessment & Plan Note (Signed)
Chronic, ongoing.  Continue current medication regimen and adjust as needed -- recommend she return to taking her Zoloft as was instructed -- may start out with 1/2 tablet (50 MG) and increase to 100 MG in one week.  Continue collaboration with psychiatry at [facility] and therapy.  Denies SI/HI.

## 2020-04-20 ENCOUNTER — Encounter: Payer: Self-pay | Admitting: Physical Therapy

## 2020-04-20 ENCOUNTER — Ambulatory Visit: Payer: Medicaid Other | Attending: Urology | Admitting: Physical Therapy

## 2020-04-20 DIAGNOSIS — M533 Sacrococcygeal disorders, not elsewhere classified: Secondary | ICD-10-CM | POA: Insufficient documentation

## 2020-04-20 DIAGNOSIS — R293 Abnormal posture: Secondary | ICD-10-CM | POA: Insufficient documentation

## 2020-04-20 DIAGNOSIS — M62838 Other muscle spasm: Secondary | ICD-10-CM | POA: Diagnosis not present

## 2020-04-20 NOTE — Therapy (Signed)
[facility] [address] [place], [place], [address] Phone: [phone]   Fax:  [phone]  Physical Therapy Evaluation  Patient Details  Name: Chelsea Parks MRN: [id number] Date of Birth: 03-08-1996 Referring Provider (PT): Vanna Scotland   Encounter Date: 04/20/2020   PT End of Session - 04/20/20 1612    Visit Number 1    Number of Visits 4    Date for PT Re-Evaluation 05/18/20    Authorization Type Medicaid    PT Start Time 1600    PT Stop Time 1655    PT Time Calculation (min) 55 min    Activity Tolerance Patient tolerated treatment well    Behavior During Therapy [facility] for tasks assessed/performed           Past Medical History:  Diagnosis Date  . Anxiety   . Bipolar disorder (HCC) 2019  . Depression   . H/O postpartum depression, currently pregnant   . Herpes genitalis in women   . Pelvic floor dysfunction in female 10/2019    Past Surgical History:  Procedure Laterality Date  . CERVICAL BIOPSY    . no surgical history      There were no vitals filed for this visit.    Subjective Assessment - 04/20/20 1604    Subjective Patient presents to clinic regarding coccyx pain; she notes that it was very tender to the touch, but it has resolved since then. Patient also feels her knees are weak. Patient notes that she has been doing exercises and stretches and getting some relief. Patient notes that this pain started insidiously with no clear explanation. Patient has been seen by a spine specialist at [facility] and had a CT scan with recommendation for an MRI. Patient follows up with them 06/12/2020. Patient adds that she feels all of her previous therapeutic gains from PFPT have remained with no bowel or urinary issues at this time.    Pertinent History Patient familiar to clinic with previous good outcomes for management of PFM dysfunction/spasm.    Currently in Pain? Yes    Pain Score 2     Pain Location Coccyx    Pain  Descriptors / Indicators Aching           OBJECTIVE  Mental Status Patient is oriented to person, place and time.  Recent memory is intact.  Remote memory is intact.  Attention span and concentration are intact.  Expressive speech is intact.  Patient's fund of knowledge is within normal limits for educational level.  POSTURE/OBSERVATIONS:  Lumbar lordosis: diminished in sitting Patient sits with increased posterior pelvic tilt in sitting and has some difficulty maintaining neutral posture.  GAIT: Trendelenburg R: Negative L: Negative  RANGE OF MOTION: grossly WFL   LEFT RIGHT  Lumbar forward flexion (65):  WFL    Lumbar extension (30): WFL    Lumbar lateral flexion (25):  [facility]  Thoracic and Lumbar rotation (30 degrees):    [facility] WFL  Hip Flexion (0-125):   [facility] WFL  Hip IR (0-45):  [facility] WFL  Hip ER (0-45):  [facility] WFL  Hip Abduction (0-40):  [facility] WFL  Hip extension (0-15):  [facility] WFL     SENSATION: Grossly intact to light touch bilateral LEs as determined by testing dermatomes L2-S2 Proprioception and hot/cold testing deferred on this date  STRENGTH: MMT   RLE LLE  Hip Flexion 5 5  Hip Extension 5 5  Hip Abduction  5 5  Hip Adduction  5 5  Hip ER  5 5  Hip IR  5 5  Knee Extension 5 5  Knee Flexion 5 5  Dorsiflexion  5 5  Plantarflexion (seated) 5 5   ABDOMINAL:  Palpation: no TTP Diastasis: none present Scar mobility: n/a Rib flare: none present  PHYSICAL PERFORMANCE MEASURES: STS: WNL  EXTERNAL PELVIC EXAM: deferred 2/2 to time constraints Palpation: Breath coordination: Cued Lengthen: Cued Contraction: Cough:  INTERNAL VAGINAL EXAM: deferred 2/2 to time constraints Introitus Appears:  Skin integrity:  Scar mobility: Strength (PERF):  Symmetry: Palpation: Prolapse:  JOINT MOBILITY Generally WNL throughout thoracic and lumbar spine with some tenderness to end range PAVIM at L3/4. Sacral ligaments with hypomobility B and some  tenderness.   OUTCOME MEASURES: FOTO: PFDI Pain 76    ASSESSMENT Patient is a 24 year old presenting to clinic with chief complaints of coccyx pain. Upon examination, patient demonstrates deficits in posture, pain, body mechanics as evidenced by diminished lordosis in sitting, coccyx pain worst 7/10, difficulty finding and maintaining neutral posture, hypomobility at sacral ligaments. Patient's responses on FOTO outcome measures (46) indicate moderate functional limitations/disability/distress. Patient's progress may be limited due to unclear etiology; however, patient's motivation and past commitment to HEP are advantageous. Patient was able to achieve 0/10 pain with mobilizations of sacral ligaments and STM of B glutes during today's evaluation and responded positively to educational interventions. Patient will benefit from continued skilled therapeutic intervention to address deficits in posture, pain, and body mechanics in order to increase function and improve overall QOL.  EDUCATION Patient educated on prognosis, POC, and provided with HEP including: PFM stretches with PFM lengthening. Patient articulated understanding and returned demonstration. Patient will benefit from further education in order to maximize compliance and understanding for long-term therapeutic gains.       Objective measurements completed on examination: See above findings.                    PT Long Term Goals - 04/20/20 1732      PT LONG TERM GOAL #1   Title Patient will demonstrate independence with HEP in order to maximize therapeutic gains and improve carryover from physical therapy sessions to ADLs in the home and community.    Baseline IE: not demonstrated    Time 4    Period Weeks    Status New    Target Date 05/18/20      PT LONG TERM GOAL #2   Title Patient will demonstrate improved postural awareness/control as evidenced by ability to maintain neutral posture for > 5 minutes of  standing activity for improved function with ADLs and basic household chores.    Baseline IE: not demonstrated    Time 4    Period Weeks    Status New    Target Date 05/18/20      PT LONG TERM GOAL #3   Title Patient will decrease worst pain as reported on NPRS by at least 2 points to demonstrate clinically significant reduction in pain in order to restore/improve function and overall QOL.    Baseline IE: coccyx 7/10    Time 4    Period Weeks    Status New    Target Date 05/18/20      PT LONG TERM GOAL #4   Title Patient will demonstrate improved function as evidenced by a score of 23 on FOTO measure for full participation in activities at home and in the community.    Baseline IE: 46 (PFDI  Pain)    Time 4    Period Weeks    Status New    Target Date 05/18/20                  Plan - 04/20/20 1613    Clinical Impression Statement Patient is a 24 year old presenting to clinic with chief complaints of coccyx pain. Upon examination, patient demonstrates deficits in posture, pain, body mechanics as evidenced by diminished lordosis in sitting, coccyx pain worst 7/10, difficulty finding and maintaining neutral posture, hypomobility at sacral ligaments. Patient's responses on FOTO outcome measures (46) indicate moderate functional limitations/disability/distress. Patient's progress may be limited due to unclear etiology; however, patient's motivation and past commitment to HEP are advantageous. Patient was able to achieve 0/10 pain with mobilizations of sacral ligaments and STM of B glutes during today's evaluation and responded positively to educational interventions. Patient will benefit from continued skilled therapeutic intervention to address deficits in posture, pain, and body mechanics in order to increase function and improve overall QOL.    Personal Factors and Comorbidities Age;Education;Sex;Social Background;Fitness;Time since onset of injury/illness/exacerbation;Behavior  Pattern;Past/Current Experience;Comorbidity 3+    Comorbidities bipolar 1, PTSD, anxiety    Examination-Activity Limitations Continence;Toileting;Caring for Others;Lift;Squat;Stairs;Reach Overhead;Stand;Sit;Transfers    Examination-Participation Restrictions Interpersonal Relationship;Cleaning;Laundry;Shop;Meal Prep    Stability/Clinical Decision Making Evolving/Moderate complexity    Rehab Potential Good    PT Frequency 1x / week    PT Duration 4 weeks    PT Treatment/Interventions ADLs/Self Care Home Management;Biofeedback;Moist Heat;Cryotherapy;Electrical Stimulation;Therapeutic activities;Functional mobility training;Stair training;Gait training;Therapeutic exercise;Balance training;Neuromuscular re-education;Scar mobilization;Manual techniques;Patient/family education;Taping;Orthotic Fit/Training;Dry needling;Passive range of motion;Joint Manipulations;Spinal Manipulations    PT Next Visit Plan internal coccyx mobilizations    PT Home Exercise Plan PFM stretches with reverse kegel    Consulted and Agree with Plan of Care Patient           Patient will benefit from skilled therapeutic intervention in order to improve the following deficits and impairments:  Decreased mobility, Increased muscle spasms, Pain, Postural dysfunction, Increased fascial restricitons, Decreased coordination, Decreased activity tolerance, Hypomobility, Impaired flexibility, Decreased endurance, Improper body mechanics, Decreased range of motion  Visit Diagnosis: Abnormal posture  Coccyx pain     Problem List Patient Active Problem List   Diagnosis Date Noted  . Coccyx pain 03/23/2020  . PTSD (post-traumatic stress disorder) 12/20/2019  . Pelvic floor dysfunction 12/20/2019  . HSV infection 09/09/2018  . Bipolar 1 disorder (HCC) 09/10/2017   Sheria Lang PT, DPT [phone] 04/20/2020, 5:36 PM  [facility] [address] [place], [place], [address] Phone:  [phone]   Fax:  [phone]  Name: Chelsea Parks MRN: [id number] Date of Birth: 1996/08/20

## 2020-04-21 ENCOUNTER — Ambulatory Visit: Payer: Medicaid Other | Admitting: Nurse Practitioner

## 2020-04-24 DIAGNOSIS — F329 Major depressive disorder, single episode, unspecified: Secondary | ICD-10-CM | POA: Diagnosis not present

## 2020-04-25 DIAGNOSIS — F3132 Bipolar disorder, current episode depressed, moderate: Secondary | ICD-10-CM | POA: Diagnosis not present

## 2020-04-26 ENCOUNTER — Ambulatory Visit: Payer: Medicaid Other | Admitting: Physical Therapy

## 2020-04-26 ENCOUNTER — Other Ambulatory Visit: Payer: Self-pay

## 2020-04-26 ENCOUNTER — Encounter: Payer: Self-pay | Admitting: Physical Therapy

## 2020-04-26 DIAGNOSIS — M533 Sacrococcygeal disorders, not elsewhere classified: Secondary | ICD-10-CM | POA: Diagnosis not present

## 2020-04-26 DIAGNOSIS — M62838 Other muscle spasm: Secondary | ICD-10-CM | POA: Diagnosis not present

## 2020-04-26 DIAGNOSIS — R293 Abnormal posture: Secondary | ICD-10-CM | POA: Diagnosis not present

## 2020-04-26 NOTE — Therapy (Signed)
[facility] [address] [place], [place], [address] Phone: [phone]   Fax:  [phone]  Physical Therapy Treatment  Patient Details  Name: MATTY DEAMER MRN: [id number] Date of Birth: 06-Jan-1996 Referring Provider (PT): Olevia Perches   Encounter Date: 04/26/2020   PT End of Session - 04/26/20 1448    Visit Number 2    Number of Visits 4    Date for PT Re-Evaluation 05/18/20    Authorization Type Medicaid    PT Start Time 1358    PT Stop Time 1452    PT Time Calculation (min) 54 min    Activity Tolerance Patient tolerated treatment well    Behavior During Therapy [facility] for tasks assessed/performed           Past Medical History:  Diagnosis Date   Anxiety    Bipolar disorder (HCC) 2019   Depression    H/O postpartum depression, currently pregnant    Herpes genitalis in women    Pelvic floor dysfunction in female 10/2019    Past Surgical History:  Procedure Laterality Date   CERVICAL BIOPSY     no surgical history      There were no vitals filed for this visit.   Subjective Assessment - 04/26/20 1400    Subjective Patient notes pain is pretty mild today. She notes that she has been laying on her back to get some relief and does a knee to chest stretch.    Pertinent History Patient familiar to clinic with previous good outcomes for management of PFM dysfunction/spasm.    Currently in Pain? Yes    Pain Score 2     Pain Location Coccyx          TREATMENT  Manual Therapy: INTERNAL VAGINAL EXAM: Patient educated on the purpose of the pelvic exam and articulated understanding; patient consented to the exam verbally. Introitus Appears: WNL Skin integrity: WNL Palpation: TTP over levator ani, iliococcygeus, and coccygeus at 7 to 4 o'clock; greatest from 6-4 o'clock Prolapse: negative  STM and TPR performed to levator ani, iliococcygeus, and coccygeus at 7 to 4 o'clock to allow for decreased tension  and pain and improved posture and function STM and TPR performed to B gluteal complexes to allow for decreased tension and pain and improved posture and function with vibratory/percussive instrument   Neuromuscular Re-education: Reviewed stretches for improved spinal mobility and PFM extensibility: knee to chest, happy baby, counter cat/cow, pelvic tilts.   Treatments unbilled: Cryotherapy to anterior/posterior pelvic during manual interventions.   Patient educated throughout session on appropriate technique and form using multi-modal cueing, HEP, and activity modification. Patient articulated understanding and returned demonstration.  Patient Response to interventions: Reports 0/10 pain and feeling very relaxed.  ASSESSMENT Patient presents to clinic with excellent motivation to participate in therapy. Patient demonstrates deficits in posture, pain, body mechanics, and PFM extensibility. Patient able to achieve good release of PFM with gentle TPR and diaphragmatic breath during today's session and responded positively to manual interventions. Patient will benefit from continued skilled therapeutic intervention to address remaining deficits in posture, pain, body mechanics, and PFM extensibility in order to increase function and improve overall QOL.     PT Long Term Goals - 04/20/20 1732      PT LONG TERM GOAL #1   Title Patient will demonstrate independence with HEP in order to maximize therapeutic gains and improve carryover from physical therapy sessions to ADLs in the home and community.  Baseline IE: not demonstrated    Time 4    Period Weeks    Status New    Target Date 05/18/20      PT LONG TERM GOAL #2   Title Patient will demonstrate improved postural awareness/control as evidenced by ability to maintain neutral posture for > 5 minutes of standing activity for improved function with ADLs and basic household chores.    Baseline IE: not demonstrated    Time 4    Period  Weeks    Status New    Target Date 05/18/20      PT LONG TERM GOAL #3   Title Patient will decrease worst pain as reported on NPRS by at least 2 points to demonstrate clinically significant reduction in pain in order to restore/improve function and overall QOL.    Baseline IE: coccyx 7/10    Time 4    Period Weeks    Status New    Target Date 05/18/20      PT LONG TERM GOAL #4   Title Patient will demonstrate improved function as evidenced by a score of 23 on FOTO measure for full participation in activities at home and in the community.    Baseline IE: 46 (PFDI Pain)    Time 4    Period Weeks    Status New    Target Date 05/18/20                 Plan - 04/26/20 1503    Clinical Impression Statement Patient presents to clinic with excellent motivation to participate in therapy. Patient demonstrates deficits in posture, pain, body mechanics, and PFM extensibility. Patient able to achieve good release of PFM with gentle TPR and diaphragmatic breath during today's session and responded positively to manual interventions. Patient will benefit from continued skilled therapeutic intervention to address remaining deficits in posture, pain, body mechanics, and PFM extensibility in order to increase function and improve overall QOL.    Personal Factors and Comorbidities Age;Education;Sex;Social Background;Fitness;Time since onset of injury/illness/exacerbation;Behavior Pattern;Past/Current Experience;Comorbidity 3+    Comorbidities bipolar 1, PTSD, anxiety    Examination-Activity Limitations Continence;Toileting;Caring for Others;Lift;Squat;Stairs;Reach Overhead;Stand;Sit;Transfers    Examination-Participation Restrictions Interpersonal Relationship;Cleaning;Laundry;Shop;Meal Prep    Stability/Clinical Decision Making Evolving/Moderate complexity    Rehab Potential Good    PT Frequency 1x / week    PT Duration 4 weeks    PT Treatment/Interventions ADLs/Self Care Home  Management;Biofeedback;Moist Heat;Cryotherapy;Electrical Stimulation;Therapeutic activities;Functional mobility training;Stair training;Gait training;Therapeutic exercise;Balance training;Neuromuscular re-education;Scar mobilization;Manual techniques;Patient/family education;Taping;Orthotic Fit/Training;Dry needling;Passive range of motion;Joint Manipulations;Spinal Manipulations    PT Next Visit Plan internal coccyx mobilizations    PT Home Exercise Plan PFM stretches with reverse kegel    Consulted and Agree with Plan of Care Patient           Patient will benefit from skilled therapeutic intervention in order to improve the following deficits and impairments:  Decreased mobility, Increased muscle spasms, Pain, Postural dysfunction, Increased fascial restricitons, Decreased coordination, Decreased activity tolerance, Hypomobility, Impaired flexibility, Decreased endurance, Improper body mechanics, Decreased range of motion  Visit Diagnosis: Abnormal posture  Coccyx pain  Other muscle spasm     Problem List Patient Active Problem List   Diagnosis Date Noted   Coccyx pain 03/23/2020   PTSD (post-traumatic stress disorder) 12/20/2019   Pelvic floor dysfunction 12/20/2019   HSV infection 09/09/2018   Bipolar 1 disorder (HCC) 09/10/2017   Sheria Lang PT, DPT [id number] 04/26/2020, 5:00 PM   [facility] [place], [place], [address] Phone: [phone]   Fax:  [phone]  Name: EIMY PLAZA MRN: [id number] Date of Birth: [birth year]

## 2020-04-27 ENCOUNTER — Encounter: Payer: Self-pay | Admitting: Physical Therapy

## 2020-04-27 DIAGNOSIS — F410 Panic disorder [episodic paroxysmal anxiety] without agoraphobia: Secondary | ICD-10-CM | POA: Diagnosis not present

## 2020-04-27 DIAGNOSIS — F322 Major depressive disorder, single episode, severe without psychotic features: Secondary | ICD-10-CM | POA: Diagnosis not present

## 2020-04-27 DIAGNOSIS — R636 Underweight: Secondary | ICD-10-CM | POA: Diagnosis not present

## 2020-04-27 DIAGNOSIS — F419 Anxiety disorder, unspecified: Secondary | ICD-10-CM | POA: Diagnosis not present

## 2020-05-02 ENCOUNTER — Other Ambulatory Visit: Payer: Self-pay

## 2020-05-02 ENCOUNTER — Encounter: Payer: Self-pay | Admitting: Physical Therapy

## 2020-05-02 ENCOUNTER — Ambulatory Visit: Payer: Medicaid Other | Attending: Urology | Admitting: Physical Therapy

## 2020-05-02 DIAGNOSIS — M533 Sacrococcygeal disorders, not elsewhere classified: Secondary | ICD-10-CM

## 2020-05-02 DIAGNOSIS — R278 Other lack of coordination: Secondary | ICD-10-CM | POA: Insufficient documentation

## 2020-05-02 DIAGNOSIS — R293 Abnormal posture: Secondary | ICD-10-CM

## 2020-05-02 DIAGNOSIS — M62838 Other muscle spasm: Secondary | ICD-10-CM | POA: Insufficient documentation

## 2020-05-02 DIAGNOSIS — M6281 Muscle weakness (generalized): Secondary | ICD-10-CM | POA: Diagnosis not present

## 2020-05-02 NOTE — Therapy (Signed)
[facility] [address] [place], [place], [address] Phone: [phone]   Fax:  [phone]  Physical Therapy Treatment  Patient Details  Name: Chelsea Parks MRN: [id number] Date of Birth: [birth year] Referring Provider (PT): Olevia Perches   Encounter Date: 05/02/2020   PT End of Session - 05/02/20 1415    Visit Number 3    Number of Visits 4    Date for PT Re-Evaluation 05/18/20    Authorization Type Medicaid    PT Start Time 1400    PT Stop Time 1455    PT Time Calculation (min) 55 min    Activity Tolerance Patient tolerated treatment well    Behavior During Therapy [facility] for tasks assessed/performed           Past Medical History:  Diagnosis Date  . Anxiety   . Bipolar disorder (HCC) 2019  . Depression   . H/O postpartum depression, currently pregnant   . Herpes genitalis in women   . Pelvic floor dysfunction in female 10/2019    Past Surgical History:  Procedure Laterality Date  . CERVICAL BIOPSY    . no surgical history      There were no vitals filed for this visit.   Subjective Assessment - 05/02/20 1410    Subjective Patient notes that she is feeling good today and has been pain free for a few days. She was tender after last session's internal release, but the tenderness did not last more than 24 hours. Patient notes that she feels her tailbone pain is largely resolved and has not had pain in the past week.    Pertinent History Patient familiar to clinic with previous good outcomes for management of PFM dysfunction/spasm.    Currently in Pain? No/denies           TREATMENT  Manual Therapy: STM and TPR performed to B gluteal complexes to allow for decreased tension and pain and improved posture and function with vibratory/percussive instrument CPA/UPA lumbar mobilizations for decreased spasm and improved mobility, grade III Sacral border mobilizations with movement for decreased spasm and improved  mobility, grade III  Neuromuscular Re-education: Child's Pose x5 breaths for improved spinal mobility Controlled articular rotations of spine for improved joint mobility and proprioception, 30-50% MVC, x4 each direction Balance disc, seated postural control/awareness:  Pelvic tilts with coordinated breath, x10  Neutral pelvis hold, 3 sec x5   Patient educated throughout session on appropriate technique and form using multi-modal cueing, HEP, and activity modification. Patient articulated understanding and returned demonstration.  Patient Response to interventions: Reports 0/10 pain and feeling very loose.  ASSESSMENT Patient presents to clinic with excellent motivation to participate in therapy. Patient demonstrates deficits in posture, pain, body mechanics, and PFM extensibility. Patient able to coordinate good spinal mobility in quadruped position with TCs during today's session and responded positively to manual interventions. Patient will benefit from continued skilled therapeutic intervention to address remaining deficits in posture, pain, body mechanics, and PFM extensibility in order to increase function and improve overall QOL.    PT Long Term Goals - 04/20/20 1732      PT LONG TERM GOAL #1   Title Patient will demonstrate independence with HEP in order to maximize therapeutic gains and improve carryover from physical therapy sessions to ADLs in the home and community.    Baseline IE: not demonstrated    Time 4    Period Weeks    Status New    Target Date  05/18/20      PT LONG TERM GOAL #2   Title Patient will demonstrate improved postural awareness/control as evidenced by ability to maintain neutral posture for > 5 minutes of standing activity for improved function with ADLs and basic household chores.    Baseline IE: not demonstrated    Time 4    Period Weeks    Status New    Target Date 05/18/20      PT LONG TERM GOAL #3   Title Patient will decrease worst pain as  reported on NPRS by at least 2 points to demonstrate clinically significant reduction in pain in order to restore/improve function and overall QOL.    Baseline IE: coccyx 7/10    Time 4    Period Weeks    Status New    Target Date 05/18/20      PT LONG TERM GOAL #4   Title Patient will demonstrate improved function as evidenced by a score of 23 on FOTO measure for full participation in activities at home and in the community.    Baseline IE: 46 (PFDI Pain)    Time 4    Period Weeks    Status New    Target Date 05/18/20                 Plan - 05/02/20 1416    Clinical Impression Statement Patient presents to clinic with excellent motivation to participate in therapy. Patient demonstrates deficits in posture, pain, body mechanics, and PFM extensibility. Patient able to coordinate good spinal mobility in quadruped position with TCs during today's session and responded positively to manual interventions. Patient will benefit from continued skilled therapeutic intervention to address remaining deficits in posture, pain, body mechanics, and PFM extensibility in order to increase function and improve overall QOL.    Personal Factors and Comorbidities Age;Education;Sex;Social Background;Fitness;Time since onset of injury/illness/exacerbation;Behavior Pattern;Past/Current Experience;Comorbidity 3+    Comorbidities bipolar 1, PTSD, anxiety    Examination-Activity Limitations Continence;Toileting;Caring for Others;Lift;Squat;Stairs;Reach Overhead;Stand;Sit;Transfers    Examination-Participation Restrictions Interpersonal Relationship;Cleaning;Laundry;Shop;Meal Prep    Stability/Clinical Decision Making Evolving/Moderate complexity    Rehab Potential Good    PT Frequency 1x / week    PT Duration 4 weeks    PT Treatment/Interventions ADLs/Self Care Home Management;Biofeedback;Moist Heat;Cryotherapy;Electrical Stimulation;Therapeutic activities;Functional mobility training;Stair training;Gait  training;Therapeutic exercise;Balance training;Neuromuscular re-education;Scar mobilization;Manual techniques;Patient/family education;Taping;Orthotic Fit/Training;Dry needling;Passive range of motion;Joint Manipulations;Spinal Manipulations    PT Next Visit Plan spinal mobility    PT Home Exercise Plan PFM stretches with reverse kegel    Consulted and Agree with Plan of Care Patient           Patient will benefit from skilled therapeutic intervention in order to improve the following deficits and impairments:  Decreased mobility, Increased muscle spasms, Pain, Postural dysfunction, Increased fascial restricitons, Decreased coordination, Decreased activity tolerance, Hypomobility, Impaired flexibility, Decreased endurance, Improper body mechanics, Decreased range of motion  Visit Diagnosis: Abnormal posture  Coccyx pain  Other muscle spasm  Muscle weakness (generalized)  Other lack of coordination     Problem List Patient Active Problem List   Diagnosis Date Noted  . Coccyx pain 03/23/2020  . PTSD (post-traumatic stress disorder) 12/20/2019  . Pelvic floor dysfunction 12/20/2019  . HSV infection 09/09/2018  . Bipolar 1 disorder (HCC) 09/10/2017   Sheria Lang PT, DPT [id number] 05/02/2020, 5:26 PM  [facility] [address] [place], [place], [address] Phone: [phone]   Fax:  [phone]  Name: Clois Dupes  MRN: [id number] Date of Birth: 05/10/1996

## 2020-05-03 DIAGNOSIS — F322 Major depressive disorder, single episode, severe without psychotic features: Secondary | ICD-10-CM | POA: Diagnosis not present

## 2020-05-09 ENCOUNTER — Encounter: Payer: Medicaid Other | Admitting: Physical Therapy

## 2020-05-09 DIAGNOSIS — F3112 Bipolar disorder, current episode manic without psychotic features, moderate: Secondary | ICD-10-CM | POA: Diagnosis not present

## 2020-05-16 ENCOUNTER — Encounter: Payer: Self-pay | Admitting: Obstetrics and Gynecology

## 2020-05-16 ENCOUNTER — Other Ambulatory Visit: Payer: Self-pay

## 2020-05-16 ENCOUNTER — Ambulatory Visit (INDEPENDENT_AMBULATORY_CARE_PROVIDER_SITE_OTHER): Payer: Medicaid Other | Admitting: Obstetrics and Gynecology

## 2020-05-16 VITALS — BP 103/68 | HR 71 | Ht 62.8 in | Wt 113.5 lb

## 2020-05-16 DIAGNOSIS — F319 Bipolar disorder, unspecified: Secondary | ICD-10-CM

## 2020-05-16 DIAGNOSIS — Z308 Encounter for other contraceptive management: Secondary | ICD-10-CM | POA: Diagnosis not present

## 2020-05-16 MED ORDER — NORETHINDRONE ACET-ETHINYL EST 1.5-30 MG-MCG PO TABS: 1.0000 | ORAL_TABLET | Freq: Every day | ORAL | 1 refills | Status: DC

## 2020-05-16 NOTE — Progress Notes (Signed)
    GYNECOLOGY PROGRESS NOTE Subjective:    Patient ID: Chelsea Parks, female    DOB: 1996-04-14, 24 y.o.   MRN: [id number]  HPI  Patient is a 24 y.o. [id number] female who presents to discuss tubal ligation. Currently using OCPs or contraception but would like to discuss permanent sterilization. Patient states she has 3 children and is happy; she nor her husband desire. to have any more children. Patient has a history of postpartum depression and following her most recent pregnancy, she was diagnosed with bipolar disorder type I. Previously had a severe episode of postpartum depression requiring inpatient management after her 2nd child. Patient is currently taking Zoloft 100 mg for depression symptoms and has just started seeing a new psychiatrist at [facility] in [place]. Patient also has pelvic floor dysfunction. She is still having this worked up and has a MRI on Friday but states her pelvis tilts toward her rectum and she is also having pelvic muscles spasms. She cites these reasons for why she desires a tubal ligation, in addition to not desiring to utilize hormones as she feels like it further exacerbate her mood symptoms.    The following portions of the patient's history were reviewed and updated as appropriate: allergies, current medications, past family history, past medical history, past social history, past surgical history and problem list.  Review of Systems Pertinent items noted in HPI and remainder of comprehensive ROS otherwise negative.   Objective:   Blood pressure 103/68, pulse 71, height 5' 2.8" (1.595 m), weight 51.5 kg, last menstrual period 04/23/2020, not currently breastfeeding. General appearance: alert and no distress Exam deferred today.    Assessment:   Tubal ligation counseling Bipolar I disorder  Plan:   1. Patient desires permanent sterilization.  Other reversible forms of contraception were discussed with patient; she declines all  other modalities. Risks of procedure discussed with patient including but not limited to: risk of regret, permanence of method, bleeding, infection, injury to surrounding organs and need for additional procedures.  Failure risk of about 1% with increased risk of ectopic gestation if pregnancy occurs was also discussed with patient.  Also discussed possibility of post-tubal pain syndrome. Patient verbalized understanding of these risks and wants to proceed with sterilization.  Medicaid sterilization form signed today. Advised that she must wait 30 days for papers to be compliant. To notify MD of when surgical procedure is desired to be scheduled. Will need to return for pre-op exam  2. Bipolar disorder currently being managed in [place]. On Zoloft.    A total of 15 minutes were spent face-to-face with the patient during this encounter and over half of that time dealt with counseling and coordination of care.    I have seen and examined the patient with Dominic Pea, [place] PA-S.  I have reviewed the record and concur with patient management and plan.   Hildred Laser, MD [facility]

## 2020-05-16 NOTE — Progress Notes (Signed)
Pt present to discuss having surgery for a tubal ligation. Pt stated that she was doing well no problems.

## 2020-05-16 NOTE — Patient Instructions (Signed)
Surgery to Prevent Pregnancy Female sterilization is surgery to prevent pregnancy. In this surgery, the fallopian tubes are either blocked or closed off. When the fallopian tubes are closed, the eggs that the ovaries release cannot enter the uterus, sperm cannot reach the eggs, and you cannot get pregnant. Sterilization is permanent. It should only be done if you are sure that you do not want to be able to have children. What are the sterilization surgery options? There are several kinds of female sterilization surgeries. They include:  Laparoscopic tubal ligation. In this surgery, the fallopian tubes are tied off, sealed with heat, or blocked with a clip, ring, or clamp. A small portion of each fallopian tube may also be removed. This surgery is done through several small cuts (incisions) with special instruments that are inserted into your abdomen.  Postpartum tubal ligation. This is also called a mini-laparotomy. This surgery is done right after childbirth or 1 or 2 days after childbirth. In this surgery, the fallopian tubes are tied off, sealed with heat, or blocked with a clip, ring, or clamp. A small portion of each fallopian tube may also be removed. The surgery is done through a single incision in the abdomen.  Tubal ligation during a C-section. In this surgery, the fallopian tubes are tied off, sealed with heat, or blocked with a clip, ring, or clamp. A small portion of each fallopian tube may also be removed. The surgery is done at the same time as a C-section delivery. Is sterilization safe? Generally, sterilization is safe. Complications are rare. However, there are risks. They include:  Bleeding.  Infection.  Reaction to medicine used during the procedure.  Injury to surrounding organs.  Failure of the procedure. How effective is sterilization? Sterilization is nearly 100% effective, but it can fail. In rare cases, the fallopian tubes can grow back together over time. If this  happens, pregnancy may be possible and you will be able to get pregnant again. Women who have had this procedure have a higher chance of having an ectopic pregnancy. An ectopic pregnancy is a pregnancy that happens outside of the uterus. This kind of pregnancy can lead to serious bleeding if it is not treated. What are the benefits?  It is usually effective for a lifetime.  It is usually safe.  It does not have the drawbacks of other types of birth control in that your hormones are not affected. Because of this, your menstrual periods, sexual desire, and sexual performance will not be affected. What are the drawbacks?  You will need to recover and may have complications after surgery.  If you change your mind and decide that you want to have children, you may not be able to. Sterilization may be reversed, but a reversal is not always successful.  It does not provide protection against STDs (sexually transmitted diseases).  It increases the chance of having an ectopic pregnancy. Follow these instructions at home:  Keep all follow-up visits as told by your health care provider. This is important. Summary  Female sterilization is surgery to prevent pregnancy.  There are different types of female sterilization surgeries.  Sterilization may be reversed, but a reversal is not always successful.  Sterilization does not protect against STDs. This information is not intended to replace advice given to you by your health care provider. Make sure you discuss any questions you have with your health care provider. Document Revised: 03/31/2019 Document Reviewed: 06/26/2018 Elsevier Patient Education  2020 Else[employer] Inc[employer]   Laparoscopic Tubal   Tubal Ligation Laparoscopic tubal ligation is a procedure to close the fallopian tubes. This is done so that you cannot get pregnant. When the fallopian tubes are closed, the eggs that your ovaries release cannot enter the uterus, and sperm cannot reach the  released eggs. You should not have this procedure if you want to get pregnant someday or if you are unsure about having more children. Tell a health care provider about:  Any allergies you have.  All medicines you are taking, including vitamins, herbs, eye drops, creams, and over-the-counter medicines.  Any problems you or family members have had with anesthetic medicines.  Any blood disorders you have.  Any surgeries you have had.  Any medical conditions you have.  Whether you are pregnant or may be pregnant.  Any past pregnancies. What are the risks? Generally, this is a safe procedure. However, problems may occur, including:  Infection.  Bleeding.  Injury to other organs in the abdomen.  Side effects from anesthetic medicines.  Failure of the procedure. This procedure can increase your risk of a kind of pregnancy in which a fertilized egg attaches to the outside of the uterus (ectopic pregnancy). What happens before the procedure? Medicines  Ask your health care provider about: ? Changing or stopping your regular medicines. This is especially important if you are taking diabetes medicines or blood thinners. ? Taking medicines such as aspirin and ibuprofen. These medicines can thin your blood. Do not take these medicines unless your health care provider tells you to take them. ? Taking over-the-counter medicines, vitamins, herbs, and supplements. Staying hydrated  Follow instructions from your health care provider about hydration, which may include: ? Up to 2 hours before the procedure - you may continue to drink clear liquids, such as water, clear fruit juice, black coffee, and plain tea. Eating and drinking  Follow instructions from your health care provider about eating and drinking, which may include: ? 8 hours before the procedure - stop eating heavy meals or foods, such as meat, fried foods, or fatty foods. ? 6 hours before the procedure - stop eating light  meals or foods, such as toast or cereal. ? 6 hours before the procedure - stop drinking milk or drinks that contain milk. ? 2 hours before the procedure - stop drinking clear liquids. General instructions  Do not use any products that contain nicotine or tobacco for at least 4 weeks before the procedure. These products include cigarettes, e-cigarettes, and chewing tobacco. If you need help quitting, ask your health care provider.  Plan to have someone take you home from the hospital.  If you will be going home right after the procedure, plan to have someone with you for 24 hours.  Ask your health care provider: ? How your surgery site will be marked. ? What steps will be taken to help prevent infection. These may include:  Removing hair at the surgery site.  Washing skin with a germ-killing soap.  Taking antibiotic medicine. What happens during the procedure?      An IV will be inserted into one of your veins.  You will be given one or more of the following: ? A medicine to help you relax (sedative). ? A medicine to numb the area (local anesthetic). ? A medicine to make you fall asleep (general anesthetic). ? A medicine that is injected into an area of your body to numb everything below the injection site (regional anesthetic).  Your bladder may be emptied with a small tube (  catheter).  If you have been given a general anesthetic, a tube will be put down your throat to help you breathe.  Two small incisions will be made in your lower abdomen and near your belly button.  Your abdomen will be inflated with a gas. This will let the surgeon see better and will give the surgeon room to work.  A thin, lighted tube (laparoscope) with a camera attached will be inserted into your abdomen through one of the incisions. Small instruments will be inserted through the other incision.  The fallopian tubes will be tied off, burned (cauterized), or blocked with a clip, ring, or clamp. A  small portion in the center of each fallopian tube may be removed.  The gas will be released from the abdomen.  The incisions will be closed with stitches (sutures).  A bandage (dressing) will be placed over the incisions. The procedure may vary among health care providers and hospitals. What happens after the procedure?  Your blood pressure, heart rate, breathing rate, and blood oxygen level will be monitored until you leave the hospital.  You will be given medicine to help with pain, nausea, and vomiting as needed. Summary  Laparoscopic tubal ligation is a procedure that is done so that you cannot get pregnant.  You should not have this procedure if you want to get pregnant someday or if you are unsure about having more children.  The procedure is done using a thin, lighted tube (laparoscope) with a camera attached that will be inserted into your abdomen through an incision.  Follow instructions from your health care provider about eating and drinking before the procedure. This information is not intended to replace advice given to you by your health care provider. Make sure you discuss any questions you have with your health care provider. Document Revised: 03/23/2019 Document Reviewed: 09/08/2018 Elsevier Patient Education  2020 [employer].

## 2020-05-17 ENCOUNTER — Ambulatory Visit: Payer: Medicaid Other | Admitting: Physical Therapy

## 2020-05-19 DIAGNOSIS — M533 Sacrococcygeal disorders, not elsewhere classified: Secondary | ICD-10-CM | POA: Diagnosis not present

## 2020-05-19 DIAGNOSIS — S3993XA Unspecified injury of pelvis, initial encounter: Secondary | ICD-10-CM | POA: Diagnosis not present

## 2020-05-24 DIAGNOSIS — F322 Major depressive disorder, single episode, severe without psychotic features: Secondary | ICD-10-CM | POA: Diagnosis not present

## 2020-06-04 ENCOUNTER — Other Ambulatory Visit: Payer: Self-pay

## 2020-06-04 ENCOUNTER — Emergency Department
Admission: EM | Admit: 2020-06-04 | Discharge: 2020-06-04 | Disposition: A | Discharge status: Home / Self Care | Payer: Medicaid Other | Attending: Emergency Medicine | Admitting: Emergency Medicine

## 2020-06-04 DIAGNOSIS — R509 Fever, unspecified: Secondary | ICD-10-CM | POA: Insufficient documentation

## 2020-06-04 DIAGNOSIS — R109 Unspecified abdominal pain: Secondary | ICD-10-CM | POA: Diagnosis not present

## 2020-06-04 DIAGNOSIS — Z5321 Procedure and treatment not carried out due to patient leaving prior to being seen by health care provider: Secondary | ICD-10-CM | POA: Diagnosis not present

## 2020-06-04 LAB — COMPREHENSIVE METABOLIC PANEL
ALT: 14 U/L (ref 0–44)
AST: 19 U/L (ref 15–41)
Albumin: 3.9 g/dL (ref 3.5–5.0)
Alkaline Phosphatase: 61 U/L (ref 38–126)
Anion gap: 10 (ref 5–15)
BUN: 6 mg/dL (ref 6–20)
CO2: 23 mmol/L (ref 22–32)
Calcium: 8.6 mg/dL — ABNORMAL LOW (ref 8.9–10.3)
Chloride: 102 mmol/L (ref 98–111)
Creatinine, Ser: 0.7 mg/dL (ref 0.44–1.00)
GFR calc Af Amer: >60 mL/min (ref >60)
GFR calc non Af Amer: >60 mL/min (ref >60)
Glucose, Bld: 174 mg/dL — ABNORMAL HIGH (ref 70–99)
Potassium: 3.2 mmol/L — ABNORMAL LOW (ref 3.5–5.1)
Sodium: 135 mmol/L (ref 135–145)
Total Bilirubin: 0.8 mg/dL (ref 0.3–1.2)
Total Protein: 7.2 g/dL (ref 6.5–8.1)

## 2020-06-04 LAB — CBC
HCT: 35.5 % — ABNORMAL LOW (ref 36.0–46.0)
Hemoglobin: 11.9 g/dL — ABNORMAL LOW (ref 12.0–15.0)
MCH: 32.6 pg (ref 26.0–34.0)
MCHC: 33.5 g/dL (ref 30.0–36.0)
MCV: 97.3 fL (ref 80.0–100.0)
Platelets: 208 10*3/uL (ref 150–400)
RBC: 3.65 MIL/uL — ABNORMAL LOW (ref 3.87–5.11)
RDW: 12.2 % (ref 11.5–15.5)
WBC: 11.1 10*3/uL — ABNORMAL HIGH (ref 4.0–10.5)
nRBC: 0 % (ref 0.0–0.2)

## 2020-06-04 LAB — LIPASE, BLOOD: Lipase: 21 U/L (ref 11–51)

## 2020-06-04 NOTE — ED Notes (Signed)
Informed by registration that pt left 

## 2020-06-04 NOTE — ED Triage Notes (Signed)
Reports abdominal pain for the past 3 days, fever since last night.  Reports taking ibuprofen every 8 hours but no helping with pain.

## 2020-06-05 ENCOUNTER — Ambulatory Visit: Payer: Self-pay

## 2020-06-05 ENCOUNTER — Emergency Department
Admission: EM | Admit: 2020-06-05 | Discharge: 2020-06-05 | Discharge status: Against Medical Advice | Payer: Medicaid Other

## 2020-06-06 ENCOUNTER — Ambulatory Visit
Admission: EM | Admit: 2020-06-06 | Discharge: 2020-06-06 | Disposition: A | Discharge status: Home / Self Care | Payer: Medicaid Other | Attending: Internal Medicine | Admitting: Internal Medicine

## 2020-06-06 ENCOUNTER — Other Ambulatory Visit: Payer: Self-pay

## 2020-06-06 ENCOUNTER — Telehealth: Payer: Medicaid Other | Admitting: Nurse Practitioner

## 2020-06-06 DIAGNOSIS — N10 Acute tubulo-interstitial nephritis: Secondary | ICD-10-CM

## 2020-06-06 LAB — URINALYSIS, COMPLETE (UACMP) WITH MICROSCOPIC
Bilirubin Urine: NEGATIVE
Glucose, UA: NEGATIVE mg/dL
Ketones, ur: NEGATIVE mg/dL
Nitrite: NEGATIVE
Protein, ur: 30 mg/dL — AB
Specific Gravity, Urine: 1.02 (ref 1.005–1.030)
WBC, UA: >50 WBC/hpf (ref 0–5)
pH: 5.5 (ref 5.0–8.0)

## 2020-06-06 MED ORDER — LEVOFLOXACIN 750 MG PO TABS
750.0000 mg | ORAL_TABLET | Freq: Every day | ORAL | 0 refills | Status: AC
Start: 2020-06-06 — End: 2020-06-11

## 2020-06-06 NOTE — ED Triage Notes (Signed)
Patient in today w/ c/o of lower back pain, mid-lower right quadrant pain. Sx onset began last Friday. Patient states odor and urgency.

## 2020-06-06 NOTE — ED Provider Notes (Signed)
[facility]    CSN: [id number] Arrival date & time: 06/06/20  1107      History   Chief Complaint Chief Complaint  Patient presents with   Back Pain    lower    Abdominal Pain    right lower    HPI Chelsea Parks is a 24 y.o. female with a history of pelvic floor dysfunction comes to the urgent care with 3-day history of urgency, increased odor in urine and some urethral discomfort after voiding.  Patient says symptoms started 3 days ago and has been persistent.  Is progressed to include lower back/right flank pain and low-grade fever.  Patient denies any nausea or vomiting.  No dizziness, near syncope or syncopal episode.   HPI  Past Medical History:  Diagnosis Date   Anxiety    Bipolar disorder (HCC) 2019   Depression    H/O postpartum depression, currently pregnant    Herpes genitalis in women    Pelvic floor dysfunction in female 10/2019    Patient Active Problem List   Diagnosis Date Noted   Coccyx pain 03/23/2020   PTSD (post-traumatic stress disorder) 12/20/2019   Pelvic floor dysfunction 12/20/2019   HSV infection 09/09/2018   Bipolar 1 disorder (HCC) 09/10/2017    Past Surgical History:  Procedure Laterality Date   CERVICAL BIOPSY     no surgical history      OB History    Gravida  3   Para  3   Term  3   Preterm      AB      Living  3     SAB      TAB      Ectopic      Multiple  0   Live Births  3            Home Medications    Prior to Admission medications   Medication Sig Start Date End Date Taking? Authorizing Provider  Multiple Vitamin (MULTIVITAMIN) tablet Take by mouth.   Yes [provider]  Norethindrone Acetate-Ethinyl Estradiol (LOESTRIN) 1.5-30 MG-MCG tablet Take 1 tablet by mouth daily. 05/16/20  Yes Hildred Laser, MD  polyethylene glycol powder (GLYCOLAX/MIRALAX) 17 GM/SCOOP powder Take 17 g by mouth daily. 12/27/19  Yes Cannady, Jolene T, NP  Saccharomyces boulardii  (PROBIOTIC) 250 MG CAPS Take 1 tablet by mouth 2 (two) times daily. 12/27/19  Yes Cannady, Jolene T, NP  sertraline (ZOLOFT) 100 MG tablet Take 100 mg by mouth daily.   Yes [provider]  ibuprofen (ADVIL) 800 MG tablet ibuprofen 800 mg tablet  TAKE 1 TABLET BY MOUTH EVERY 8 HOURS AS NEEDED    [provider]  levofloxacin (LEVAQUIN) 750 MG tablet Take 1 tablet (750 mg total) by mouth daily for 5 days. 06/06/20 06/11/20  Merrilee Jansky, MD  mirtazapine (REMERON) 15 MG tablet Take 15 mg by mouth at bedtime. 05/03/20   [provider]  ARIPiprazole (ABILIFY) 10 MG tablet Take 15 mg by mouth daily.   06/06/20  [provider]    Family History Family History  Problem Relation Age of Onset   Hypertension Mother    Healthy Father    Pancreatic cancer Maternal Uncle    Stroke Maternal Grandfather    Cancer Maternal Grandfather    Lupus Paternal Aunt    GER disease Paternal Grandmother    Kidney cancer Neg Hx     Social History Social History   Tobacco Use  Smoking status: Former Smoker   Smokeless tobacco: Never Used  Building services engineer Use: Never used  Substance Use Topics   Alcohol use: Not Currently    Alcohol/week: 0.0 standard drinks    Comment: occasionally   Drug use: No     Allergies   Amoxicillin   Review of Systems Review of Systems  Constitutional: Negative.   Respiratory: Negative.   Gastrointestinal: Positive for abdominal pain.  Genitourinary: Positive for dysuria, flank pain, frequency, pelvic pain and urgency. Negative for vaginal discharge.  Musculoskeletal: Negative for arthralgias, gait problem and joint swelling.  Skin: Negative.   Neurological: Negative.  Negative for dizziness, light-headedness and headaches.     Physical Exam Triage Vital Signs ED Triage Vitals  Enc Vitals Group     BP 06/06/20 1237 103/63     Pulse Rate 06/06/20 1237 94     Resp 06/06/20 1237 18     Temp 06/06/20 1237 98 F  (36.7 C)     Temp Source 06/06/20 1237 Oral     SpO2 06/06/20 1237 100 %     Weight --      Height --      Head Circumference --      Peak Flow --      Pain Score 06/06/20 1239 6     Pain Loc --      Pain Edu? --      Excl. in GC? --    No data found.  Updated Vital Signs BP 103/63 (BP Location: Right Arm)    Pulse 94    Temp 98 F (36.7 C) (Oral)    Resp 18    LMP 06/06/2020 (Exact Date)    SpO2 100%   Visual Acuity Right Eye Distance:   Left Eye Distance:   Bilateral Distance:    Right Eye Near:   Left Eye Near:    Bilateral Near:     Physical Exam Vitals and nursing note reviewed.  Constitutional:      General: She is not in acute distress.    Appearance: She is not ill-appearing.  Cardiovascular:     Rate and Rhythm: Normal rate and regular rhythm.     Heart sounds: Normal heart sounds.  Pulmonary:     Effort: Pulmonary effort is normal.     Breath sounds: Normal breath sounds.  Abdominal:     General: Bowel sounds are normal.     Palpations: Abdomen is soft. There is no shifting dullness, hepatomegaly or splenomegaly.     Tenderness: There is no abdominal tenderness. There is right CVA tenderness. There is no guarding or rebound.     Hernia: No hernia is present.  Neurological:     General: No focal deficit present.     Mental Status: She is alert.      UC Treatments / Results  Labs (all labs ordered are listed, but only abnormal results are displayed) Labs Reviewed  URINALYSIS, COMPLETE (UACMP) WITH MICROSCOPIC - Abnormal; Notable for the following components:      Result Value   APPearance HAZY (*)    Hgb urine dipstick TRACE (*)    Protein, ur 30 (*)    Leukocytes,Ua MODERATE (*)    Bacteria, UA MANY (*)    All other components within normal limits  URINE CULTURE    EKG   Radiology No results found.  Procedures Procedures (including critical care time)  Medications Ordered in UC Medications - No data to display  Initial Impression  / Assessment and Plan / UC Course  I have reviewed the triage vital signs and the nursing notes.  Pertinent labs & imaging results that were available during my care of the patient were reviewed by me and considered in my medical decision making (see chart for details).     1.  Acute pyelonephritis: Urine is significant for greater than 50 WBC, leukocyte esterase, hemoglobin, many bacteria. Urine cultures have been sent Levaquin 750 mg orally daily for 5 days If urine cultures require changing antibiotics-I will call patient and update the antibiotics. Return precautions given. Final Clinical Impressions(s) / UC Diagnoses   Final diagnoses:  Acute pyelonephritis   Discharge Instructions   None    ED Prescriptions    Medication Sig Dispense Auth. Provider   levofloxacin (LEVAQUIN) 750 MG tablet Take 1 tablet (750 mg total) by mouth daily for 5 days. 5 tablet Lashona Schaaf, Britta Mccreedy, MD     PDMP not reviewed this encounter.   Merrilee Jansky, MD 06/06/20 1335

## 2020-06-08 LAB — URINE CULTURE: Culture: >=100000 — AB

## 2020-06-12 DIAGNOSIS — M533 Sacrococcygeal disorders, not elsewhere classified: Secondary | ICD-10-CM | POA: Diagnosis not present

## 2020-06-21 DIAGNOSIS — F419 Anxiety disorder, unspecified: Secondary | ICD-10-CM | POA: Diagnosis not present

## 2020-06-21 DIAGNOSIS — F410 Panic disorder [episodic paroxysmal anxiety] without agoraphobia: Secondary | ICD-10-CM | POA: Diagnosis not present

## 2020-06-21 DIAGNOSIS — F322 Major depressive disorder, single episode, severe without psychotic features: Secondary | ICD-10-CM | POA: Diagnosis not present

## 2020-06-26 DIAGNOSIS — F322 Major depressive disorder, single episode, severe without psychotic features: Secondary | ICD-10-CM | POA: Diagnosis not present

## 2020-06-28 ENCOUNTER — Ambulatory Visit: Payer: Medicaid Other | Admitting: Nurse Practitioner

## 2020-07-10 DIAGNOSIS — F322 Major depressive disorder, single episode, severe without psychotic features: Secondary | ICD-10-CM | POA: Diagnosis not present

## 2020-07-17 DIAGNOSIS — F419 Anxiety disorder, unspecified: Secondary | ICD-10-CM | POA: Diagnosis not present

## 2020-07-18 DIAGNOSIS — F322 Major depressive disorder, single episode, severe without psychotic features: Secondary | ICD-10-CM | POA: Diagnosis not present

## 2020-07-20 DIAGNOSIS — F419 Anxiety disorder, unspecified: Secondary | ICD-10-CM | POA: Diagnosis not present

## 2020-07-21 NOTE — Progress Notes (Signed)
error 

## 2020-07-24 DIAGNOSIS — F419 Anxiety disorder, unspecified: Secondary | ICD-10-CM | POA: Diagnosis not present

## 2020-07-25 DIAGNOSIS — F419 Anxiety disorder, unspecified: Secondary | ICD-10-CM | POA: Diagnosis not present

## 2020-07-28 DIAGNOSIS — F419 Anxiety disorder, unspecified: Secondary | ICD-10-CM | POA: Diagnosis not present

## 2020-07-31 DIAGNOSIS — F419 Anxiety disorder, unspecified: Secondary | ICD-10-CM | POA: Diagnosis not present

## 2020-08-01 DIAGNOSIS — F322 Major depressive disorder, single episode, severe without psychotic features: Secondary | ICD-10-CM | POA: Diagnosis not present

## 2020-08-04 DIAGNOSIS — F322 Major depressive disorder, single episode, severe without psychotic features: Secondary | ICD-10-CM | POA: Diagnosis not present

## 2020-08-07 DIAGNOSIS — F419 Anxiety disorder, unspecified: Secondary | ICD-10-CM | POA: Diagnosis not present

## 2020-08-11 DIAGNOSIS — F322 Major depressive disorder, single episode, severe without psychotic features: Secondary | ICD-10-CM | POA: Diagnosis not present

## 2020-08-14 DIAGNOSIS — F322 Major depressive disorder, single episode, severe without psychotic features: Secondary | ICD-10-CM | POA: Diagnosis not present

## 2020-08-15 ENCOUNTER — Encounter: Payer: Self-pay | Admitting: Physician Assistant

## 2020-08-15 ENCOUNTER — Other Ambulatory Visit: Payer: Self-pay

## 2020-08-15 ENCOUNTER — Ambulatory Visit (INDEPENDENT_AMBULATORY_CARE_PROVIDER_SITE_OTHER): Payer: Medicaid Other | Admitting: Physician Assistant

## 2020-08-15 VITALS — BP 127/76 | HR 92 | Ht 62.0 in | Wt 130.6 lb

## 2020-08-15 DIAGNOSIS — R300 Dysuria: Secondary | ICD-10-CM | POA: Diagnosis not present

## 2020-08-15 LAB — MICROSCOPIC EXAMINATION

## 2020-08-15 LAB — URINALYSIS, COMPLETE
Bilirubin, UA: NEGATIVE
Glucose, UA: NEGATIVE
Ketones, UA: NEGATIVE
Leukocytes,UA: NEGATIVE
Nitrite, UA: NEGATIVE
Protein,UA: NEGATIVE
RBC, UA: NEGATIVE
Specific Gravity, UA: 1.01 (ref 1.005–1.030)
Urobilinogen, Ur: 0.2 mg/dL (ref 0.2–1.0)
pH, UA: 6.5 (ref 5.0–7.5)

## 2020-08-15 NOTE — Progress Notes (Signed)
08/15/2020 9:32 AM   Rosana Berger Flath 09-May-1996 [id number]  CC: Chief Complaint  Patient presents with  . Dysuria   HPI: FALISHA OSMENT is a 24 y.o. female with PMH dysuria/recurrent UTI and pelvic floor dysfunction who presents today for evaluation of possible UTI.  She was seen most recently in clinic by Michiel Cowboy on 01/26/2020 for the same.  UA grossly infected at that time; urine culture finalized with no growth.  Today she reports a 3-week history of frequency and spasming pain at the beginning of urination. She is unsure of how frequently she is urinating and states that the spasming pain is improved if she attempts to relax when voiding.   She is fairly certain the symptoms represent UTI, however she states if not, it may be her pelvic floor dysfunction. She states she previously completed pelvic PT and does not intend to return due to her hectic schedule. She reports she has been lax in completing her pelvic PT exercises at home.  Notably, she was treated for right pyelonephritis by urgent care on 06/06/2020 with urine culture growing pansensitive E. coli.   In-office UA and microscopy today pan negative.  PMH: Past Medical History:  Diagnosis Date  . Anxiety   . Bipolar disorder (HCC) 2019  . Depression   . H/O postpartum depression, currently pregnant   . Herpes genitalis in women   . Pelvic floor dysfunction in female 10/2019    Surgical History: Past Surgical History:  Procedure Laterality Date  . CERVICAL BIOPSY    . no surgical history      Home Medications:  Allergies as of 08/15/2020      Reactions   Amoxicillin Diarrhea      Medication List       Accurate as of August 15, 2020  9:32 AM. If you have any questions, ask your nurse or doctor.        STOP taking these medications   ibuprofen 800 MG tablet Commonly known as: ADVIL Stopped by: Carman Ching, PA-C   polyethylene glycol powder 17 GM/SCOOP powder Commonly known as:  GLYCOLAX/MIRALAX Stopped by: Carman Ching, PA-C   Probiotic 250 MG Caps Stopped by: Carman Ching, PA-C   sertraline 100 MG tablet Commonly known as: ZOLOFT Stopped by: Carman Ching, PA-C     TAKE these medications   clonazePAM 0.5 MG tablet Commonly known as: KLONOPIN Take 0.5 mg by mouth daily as needed.   mirtazapine 15 MG tablet Commonly known as: REMERON Take 15 mg by mouth at bedtime.   multivitamin tablet Take by mouth.   Norethindrone Acetate-Ethinyl Estradiol 1.5-30 MG-MCG tablet Commonly known as: LOESTRIN Take 1 tablet by mouth daily.   Vraylar capsule Generic drug: cariprazine Take 1.5 mg by mouth daily.       Allergies:  Allergies  Allergen Reactions  . Amoxicillin Diarrhea    Family History: Family History  Problem Relation Age of Onset  . Hypertension Mother   . Healthy Father   . Pancreatic cancer Maternal Uncle   . Stroke Maternal Grandfather   . Cancer Maternal Grandfather   . Lupus Paternal Aunt   . GER disease Paternal Grandmother   . Kidney cancer Neg Hx     Social History:   reports that she has quit smoking. She has never used smokeless tobacco. She reports previous alcohol use. She reports that she does not use drugs.  Physical Exam: BP 127/76   Pulse 92   Ht 5\' 2"  (1.575 m)  Wt 130 lb 9.6 oz (59.2 kg)   BMI 23.89 kg/m   Constitutional:  Alert and oriented, no acute distress, nontoxic appearing HEENT: [place], AT Cardiovascular: No clubbing, cyanosis, or edema Respiratory: Normal respiratory effort, no increased work of breathing Skin: No rashes, bruises or suspicious lesions Neurologic: Grossly intact, no focal deficits, moving all 4 extremities Psychiatric: Normal mood and affect  Laboratory Data: Results for orders placed or performed in visit on 08/15/20  CULTURE, URINE COMPREHENSIVE   Specimen: Urine   UR  Result Value Ref Range   Urine Culture, Comprehensive Preliminary report    Organism  ID, Bacteria Comment   Microscopic Examination   Urine  Result Value Ref Range   WBC, UA 0-5 0 - 5 /hpf   RBC 0-2 0 - 2 /hpf   Epithelial Cells (non renal) 0-10 0 - 10 /hpf   Bacteria, UA Few None seen/Few  Urinalysis, Complete  Result Value Ref Range   Specific Gravity, UA 1.010 1.005 - 1.030   pH, UA 6.5 5.0 - 7.5   Color, UA Yellow Yellow   Appearance Ur Clear Clear   Leukocytes,UA Negative Negative   Protein,UA Negative Negative/Trace   Glucose, UA Negative Negative   Ketones, UA Negative Negative   RBC, UA Negative Negative   Bilirubin, UA Negative Negative   Urobilinogen, Ur 0.2 0.2 - 1.0 mg/dL   Nitrite, UA Negative Negative   Microscopic Examination See below:    Assessment & Plan:   1. Dysuria Spasming pain improved with relaxation exercises and UA benign today. I believe her symptoms are most consistent with pelvic floor dysfunction rather than UTI, however will send urine for culture to confirm and treat as indicated. If negative, I recommend resuming at home pelvic PT exercises. Patient expressed understanding. - Urinalysis, Complete - CULTURE, URINE COMPREHENSIVE   Return if symptoms worsen or fail to improve.  Carman Ching, PA-C  [facility] [address], [address] [place], [place] [address] [phone]

## 2020-08-18 DIAGNOSIS — F322 Major depressive disorder, single episode, severe without psychotic features: Secondary | ICD-10-CM | POA: Diagnosis not present

## 2020-08-18 LAB — CULTURE, URINE COMPREHENSIVE

## 2020-08-21 ENCOUNTER — Telehealth: Payer: Self-pay | Admitting: Physician Assistant

## 2020-08-21 DIAGNOSIS — F419 Anxiety disorder, unspecified: Secondary | ICD-10-CM | POA: Diagnosis not present

## 2020-08-21 DIAGNOSIS — R300 Dysuria: Secondary | ICD-10-CM

## 2020-08-21 NOTE — Telephone Encounter (Signed)
Contact the patient and inform her that her urine culture has come back negative.  She does not have a urinary tract infection.  I recommend she resume her home pelvic PT exercises.

## 2020-08-21 NOTE — Telephone Encounter (Signed)
Patient called the office this morning requesting results.  I read her Sam's note with the negative result.    Patient expressed understanding.

## 2020-08-28 ENCOUNTER — Ambulatory Visit (INDEPENDENT_AMBULATORY_CARE_PROVIDER_SITE_OTHER): Payer: Medicaid Other | Admitting: Urology

## 2020-08-28 ENCOUNTER — Other Ambulatory Visit
Admission: RE | Admit: 2020-08-28 | Discharge: 2020-08-28 | Disposition: A | Discharge status: Home / Self Care | Payer: Medicaid Other | Attending: Urology | Admitting: Urology

## 2020-08-28 ENCOUNTER — Other Ambulatory Visit: Payer: Self-pay | Admitting: Radiology

## 2020-08-28 ENCOUNTER — Other Ambulatory Visit: Payer: Self-pay

## 2020-08-28 ENCOUNTER — Encounter: Payer: Self-pay | Admitting: Urology

## 2020-08-28 VITALS — BP 111/69 | HR 84 | Ht 63.0 in | Wt 128.0 lb

## 2020-08-28 DIAGNOSIS — N941 Unspecified dyspareunia: Secondary | ICD-10-CM | POA: Diagnosis not present

## 2020-08-28 DIAGNOSIS — R300 Dysuria: Secondary | ICD-10-CM | POA: Insufficient documentation

## 2020-08-28 DIAGNOSIS — N3289 Other specified disorders of bladder: Secondary | ICD-10-CM

## 2020-08-28 DIAGNOSIS — F419 Anxiety disorder, unspecified: Secondary | ICD-10-CM | POA: Diagnosis not present

## 2020-08-28 LAB — URINALYSIS, COMPLETE (UACMP) WITH MICROSCOPIC
Bacteria, UA: NONE SEEN
Bilirubin Urine: NEGATIVE
Glucose, UA: NEGATIVE mg/dL
Ketones, ur: NEGATIVE mg/dL
Nitrite: NEGATIVE
Protein, ur: NEGATIVE mg/dL
Specific Gravity, Urine: 1.02 (ref 1.005–1.030)
Squamous Epithelial / HPF: NONE SEEN (ref 0–5)
pH: 7 (ref 5.0–8.0)

## 2020-08-28 LAB — BLADDER SCAN AMB NON-IMAGING

## 2020-08-28 MED ORDER — URIBEL 118 MG PO CAPS: 1.0000 | ORAL_CAPSULE | Freq: Four times a day (QID) | ORAL | 1 refills | Status: DC | PRN

## 2020-08-28 MED ORDER — VALACYCLOVIR HCL 500 MG PO TABS: 500.0000 mg | ORAL_TABLET | Freq: Every day | ORAL | 1 refills | Status: DC

## 2020-08-28 NOTE — Progress Notes (Signed)
08/28/2020 3:31 PM   Chelsea Parks 02/04/1996 [id number]  Referring provider: Marjie Skiff, NP [address] [place],  [place] [address]  Chief Complaint  Patient presents with  . Bladder Spasm    HPI: Mrs. Soderman is a 24 year old female with dysuria/rUTI's and pelvic floor dysfunction who presents today requesting an urgent appointment for bladder spasms.    She was seen initially on December 08, 2019 with Dr. Apolinar Junes.  At that appointment, her history was reviewed and lengthy discussion took place regarding her symptoms as they were occurring often times with negative urine cultures.   She was advised to contact the office when she was experiencing symptoms so that we could acquire same-day UA/urine evaluation.  She was also referred on to pelvic floor physical therapy.  She has had six therapy sessions with Kathryne Eriksson, PT.    She was also seen at [facility] for injury to her coccyx.  CT and MRI of the pelvis did not note any abnormalities.  She returned to our office on August 15, 2020 for symptoms of frequency and spasming pain at the beginning of urination and was seen by Hilton Sinclair, PA-C.   Her in office UA was pan negative.  Her urine culture was positive for mixed urogenital flora.  She was also encouraged to resume her at home pelvic floor therapy exercises.  She states that today she started to experience the intense bladder spasms again.  Her UA was positive for 21-50 WBCs.  Her PVR was 96 mL.  She is also experiencing dyspareunia.  She denies any frictional pain with intercourse, but she is experiencing vaginal pain with her partner's thrusting's.  Patient denies any modifying or aggravating factors.  Patient denies any gross hematuria or suprapubic/flank pain.  Patient denies any fevers, chills, nausea or vomiting.    PMH: Past Medical History:  Diagnosis Date  . Anxiety   . Bipolar disorder (HCC) 2019  . Depression   . H/O postpartum depression, currently  pregnant   . Herpes genitalis in women   . Pelvic floor dysfunction in female 10/2019    Surgical History: Past Surgical History:  Procedure Laterality Date  . CERVICAL BIOPSY    . no surgical history      Home Medications:  Allergies as of 08/28/2020      Reactions   Amoxicillin Diarrhea      Medication List       Accurate as of August 28, 2020 11:59 PM. If you have any questions, ask your nurse or doctor.        STOP taking these medications   mirtazapine 15 MG tablet Commonly known as: REMERON Stopped by: Michiel Cowboy, PA-C     TAKE these medications   clonazePAM 0.5 MG tablet Commonly known as: KLONOPIN Take 0.5 mg by mouth daily as needed.   multivitamin tablet Take by mouth.   Norethindrone Acetate-Ethinyl Estradiol 1.5-30 MG-MCG tablet Commonly known as: LOESTRIN Take 1 tablet by mouth daily.   Uribel 118 MG Caps Take 1 capsule (118 mg total) by mouth every 6 (six) hours as needed. Started by: Michiel Cowboy, PA-C   valACYclovir 500 MG tablet Commonly known as: Valtrex Take 1 tablet (500 mg total) by mouth daily. Started by: Michiel Cowboy, PA-C   Vraylar capsule Generic drug: cariprazine Take 1.5 mg by mouth daily.       Allergies:  Allergies  Allergen Reactions  . Amoxicillin Diarrhea    Family History:  Family History  Problem Relation Age of Onset  . Hypertension Mother   . Healthy Father   . Pancreatic cancer Maternal Uncle   . Stroke Maternal Grandfather   . Cancer Maternal Grandfather   . Lupus Paternal Aunt   . GER disease Paternal Grandmother   . Kidney cancer Neg Hx     Social History:  reports that she has quit smoking. She has never used smokeless tobacco. She reports previous alcohol use. She reports that she does not use drugs.  ROS: Pertinent ROS in HPI  Physical Exam: BP 111/69   Pulse 84   Ht 5\' 3"  (1.6 m)   Wt 128 lb (58.1 kg)   LMP 08/24/2020 (Approximate) Comment: irregular  BMI 22.67 kg/m    Constitutional:  Well nourished. Alert and oriented, No acute distress. HEENT: [place] AT, mask in place.  Trachea midline, no masses. Cardiovascular: No clubbing, cyanosis, or edema. Respiratory: Normal respiratory effort, no increased work of breathing. GI: Abdomen is soft, non tender, non distended, no abdominal masses.  GU: No CVA tenderness.  No bladder fullness or masses.  Normal external genitalia, waxed pubic hair, no lesions.  Normal urethral meatus, no lesions, no prolapse, no discharge.   No urethral masses, tenderness and/or tenderness. No bladder fullness, tenderness or masses. Pink vagina mucosa, good estrogen effect, whitish discharge, no lesions, good pelvic support, grade I cystocele and no rectocele noted.  No cervical motion tenderness.  Uterus is freely mobile and non-fixed.  No adnexal/parametria masses or tenderness noted.  Anus and perineum are without rashes or lesions.    Skin: No rashes, bruises or suspicious lesions. Lymph: No inguinal adenopathy. Neurologic: Grossly intact, no focal deficits, moving all 4 extremities. Psychiatric: Normal mood and affect.   Laboratory Data: Lab Results  Component Value Date   WBC 11.1 (H) 06/04/2020   HGB 11.9 (L) 06/04/2020   HCT 35.5 (L) 06/04/2020   MCV 97.3 06/04/2020   PLT 208 06/04/2020   Lab Results  Component Value Date   CREATININE 0.70 06/04/2020   Lab Results  Component Value Date   TSH 0.490 12/20/2019   Lab Results  Component Value Date   AST 19 06/04/2020   Lab Results  Component Value Date   ALT 14 06/04/2020    Urinalysis Component     Latest Ref Rng & Units 08/28/2020  Color, Urine     YELLOW YELLOW  Appearance     CLEAR HAZY (A)  Specific Gravity, Urine     1.005 - 1.030 1.020  pH     5.0 - 8.0 7.0  Glucose, UA     NEGATIVE mg/dL NEGATIVE  Hgb urine dipstick     NEGATIVE TRACE (A)  Bilirubin Urine     NEGATIVE NEGATIVE  Ketones, ur     NEGATIVE mg/dL NEGATIVE  Protein     NEGATIVE mg/dL  NEGATIVE  Nitrite     NEGATIVE NEGATIVE  Leukocytes,Ua     NEGATIVE SMALL (A)  Squamous Epithelial / LPF     0 - 5 NONE SEEN  WBC, UA     0 - 5 WBC/hpf 21-50  RBC / HPF     0 - 5 RBC/hpf 0-5  Bacteria, UA     NONE SEEN NONE SEEN   I have reviewed the labs.  Pertinent Imaging: Results for SHEENAH, DIMITROFF (MRN Clois Dupes) as of 09/13/2020 11:38  Ref. Range 08/28/2020 14:14  Scan Result Unknown 17ml    Assessment & Plan:  1. Bladder spasms - Urinalysis, Complete - CULTURE, URINE COMPREHENSIVE Started Keflex empirically until culture results are available Also give a prescription for Uribel for symptom control  2.  Dyspareunia We will place a referral for [facility] for vulvar and urogenital pain for further evaluation of her chronic pelvic symptoms  Return for referral to [facility] pelvic pain clinic.  These notes generated with voice recognition software. I apologize for typographical errors.  Michiel Cowboy, PA-C  [facility] [address]  [address] [place], [place] [address] [phone]

## 2020-08-28 NOTE — Addendum Note (Signed)
Addended by: Martha Clan on: 08/28/2020 10:39 AM   Modules accepted: Orders

## 2020-08-30 ENCOUNTER — Telehealth: Payer: Self-pay | Admitting: Family Medicine

## 2020-08-30 LAB — URINE CULTURE: Culture: <10000 — AB

## 2020-08-30 NOTE — Telephone Encounter (Signed)
Spoke with patient and advised results Answered her questions and let her know what shannon said in the result note

## 2020-08-30 NOTE — Telephone Encounter (Signed)
-----   Message from Harle Battiest, PA-C sent at 08/30/2020  7:36 AM EDT ----- Please let Allyana know that her urine culture was negative for infection.  Has she heard from the pelvic pain clinic at [facility]?

## 2020-08-30 NOTE — Telephone Encounter (Signed)
Patient called and results were given. Patient voiced understanding.

## 2020-09-01 DIAGNOSIS — F322 Major depressive disorder, single episode, severe without psychotic features: Secondary | ICD-10-CM | POA: Diagnosis not present

## 2020-09-02 ENCOUNTER — Ambulatory Visit
Admission: EM | Admit: 2020-09-02 | Discharge: 2020-09-02 | Disposition: A | Discharge status: Home / Self Care | Payer: Medicaid Other | Attending: Family Medicine | Admitting: Family Medicine

## 2020-09-02 ENCOUNTER — Other Ambulatory Visit: Payer: Self-pay

## 2020-09-02 DIAGNOSIS — M6289 Other specified disorders of muscle: Secondary | ICD-10-CM | POA: Diagnosis not present

## 2020-09-02 DIAGNOSIS — R300 Dysuria: Secondary | ICD-10-CM | POA: Diagnosis not present

## 2020-09-02 LAB — URINALYSIS, COMPLETE (UACMP) WITH MICROSCOPIC
Bilirubin Urine: NEGATIVE
Glucose, UA: NEGATIVE mg/dL
Hgb urine dipstick: NEGATIVE
Ketones, ur: 40 mg/dL — AB
Nitrite: POSITIVE — AB
Protein, ur: NEGATIVE mg/dL
Specific Gravity, Urine: 1.02 (ref 1.005–1.030)
WBC, UA: >50 WBC/hpf (ref 0–5)
pH: 7 (ref 5.0–8.0)

## 2020-09-02 MED ORDER — CEPHALEXIN 500 MG PO CAPS: 500.0000 mg | ORAL_CAPSULE | Freq: Two times a day (BID) | ORAL | 0 refills | Status: DC

## 2020-09-02 MED ORDER — FLUCONAZOLE 150 MG PO TABS: 150.0000 mg | ORAL_TABLET | Freq: Once | ORAL | 0 refills | Status: AC

## 2020-09-02 NOTE — ED Provider Notes (Signed)
[facility]    CSN: [id number] Arrival date & time: 09/02/20  0912      History   Chief Complaint Chief Complaint  Patient presents with   Dysuria   HPI  24 year old female with a history of UTI and pelvic floor dysfunction presents with dysuria.  Patient reports ongoing pain when she urinates.  She also has pain after she urinates.  This has been going on for nearly a month.  Worse over the past 2 weeks.  She has taken Azo and Uribel without improvement.  She recently saw her urologist on November 1.  Culture at that time was negative.  Patient is concerned that she has UTI.  Patient is particular worried that this is all from pelvic floor dysfunction.  She endorses prior issues with bowel movements and also dyspareunia.  No fever.  No abdominal pain at this time.  No flank pain.  Past Medical History:  Diagnosis Date   Anxiety    Bipolar disorder (HCC) 2019   Depression    H/O postpartum depression, currently pregnant    Herpes genitalis in women    Pelvic floor dysfunction in female 10/2019    Patient Active Problem List   Diagnosis Date Noted   Coccyx pain 03/23/2020   PTSD (post-traumatic stress disorder) 12/20/2019   Pelvic floor dysfunction 12/20/2019   HSV infection 09/09/2018   Bipolar 1 disorder (HCC) 09/10/2017    Past Surgical History:  Procedure Laterality Date   CERVICAL BIOPSY     no surgical history      OB History    Gravida  3   Para  3   Term  3   Preterm      AB      Living  3     SAB      TAB      Ectopic      Multiple  0   Live Births  3            Home Medications    Prior to Admission medications   Medication Sig Start Date End Date Taking? Authorizing Provider  clonazePAM (KLONOPIN) 0.5 MG tablet Take 0.5 mg by mouth daily as needed. 08/07/20  Yes [provider]  Meth-Hyo-M Bl-Na Phos-Ph Sal (URIBEL) 118 MG CAPS Take 1 capsule (118 mg total) by mouth every 6 (six) hours as  needed. 08/28/20  Yes McGowan, Carollee Herter A, PA-C  Multiple Vitamin (MULTIVITAMIN) tablet Take by mouth.   Yes [provider]  Norethindrone Acetate-Ethinyl Estradiol (LOESTRIN) 1.5-30 MG-MCG tablet Take 1 tablet by mouth daily. 05/16/20  Yes Hildred Laser, MD  valACYclovir (VALTREX) 500 MG tablet Take 1 tablet (500 mg total) by mouth daily. 08/28/20  Yes McGowan, Carollee Herter A, PA-C  VRAYLAR capsule Take 1.5 mg by mouth daily. 07/31/20  Yes [provider]  cephALEXin (KEFLEX) 500 MG capsule Take 1 capsule (500 mg total) by mouth 2 (two) times daily. 09/02/20   Tommie Sams, DO  fluconazole (DIFLUCAN) 150 MG tablet Take 1 tablet (150 mg total) by mouth once for 1 dose. Repeat dose in 72 hours. 09/02/20 09/02/20  Tommie Sams, DO  ARIPiprazole (ABILIFY) 10 MG tablet Take 15 mg by mouth daily.   06/06/20  [provider]    Family History Family History  Problem Relation Age of Onset   Hypertension Mother    Healthy Father    Pancreatic cancer Maternal Uncle    Stroke Maternal Grandfather    Cancer  Maternal Grandfather    Lupus Paternal Aunt    GER disease Paternal Grandmother    Kidney cancer Neg Hx     Social History Social History   Tobacco Use   Smoking status: Former Smoker   Smokeless tobacco: Never Used  Building services engineer Use: Never used  Substance Use Topics   Alcohol use: Not Currently    Alcohol/week: 0.0 standard drinks    Comment: occasionally   Drug use: No     Allergies   Amoxicillin   Review of Systems Review of Systems  Constitutional: Negative.   Genitourinary: Positive for dysuria.   Physical Exam Triage Vital Signs ED Triage Vitals  Enc Vitals Group     BP 09/02/20 0925 96/73     Pulse Rate 09/02/20 0925 79     Resp --      Temp 09/02/20 0925 97.8 F (36.6 C)     Temp Source 09/02/20 0925 Oral     SpO2 09/02/20 0925 100 %     Weight 09/02/20 0923 128 lb (58.1 kg)     Height 09/02/20 0923 5\' 3"  (1.6 m)      Head Circumference --      Peak Flow --      Pain Score 09/02/20 0922 10     Pain Loc --      Pain Edu? --      Excl. in GC? --    Updated Vital Signs BP 96/73 (BP Location: Left Arm)    Pulse 79    Temp 97.8 F (36.6 C) (Oral)    Ht 5\' 3"  (1.6 m)    Wt 58.1 kg    LMP 08/24/2020 (Approximate) Comment: irregular   SpO2 100%    Breastfeeding No    BMI 22.67 kg/m   Visual Acuity Right Eye Distance:   Left Eye Distance:   Bilateral Distance:    Right Eye Near:   Left Eye Near:    Bilateral Near:     Physical Exam Vitals and nursing note reviewed.  Constitutional:      General: She is not in acute distress.    Appearance: Normal appearance. She is not ill-appearing.  HENT:     Head: Normocephalic and atraumatic.  Eyes:     General:        Right eye: No discharge.        Left eye: No discharge.     Conjunctiva/sclera: Conjunctivae normal.  Cardiovascular:     Rate and Rhythm: Normal rate and regular rhythm.  Pulmonary:     Effort: Pulmonary effort is normal.     Breath sounds: Normal breath sounds.  Abdominal:     General: There is no distension.     Palpations: Abdomen is soft.     Tenderness: There is no abdominal tenderness.  Neurological:     Mental Status: She is alert.  Psychiatric:        Mood and Affect: Mood normal.        Behavior: Behavior normal.    UC Treatments / Results  Labs (all labs ordered are listed, but only abnormal results are displayed) Labs Reviewed  URINALYSIS, COMPLETE (UACMP) WITH MICROSCOPIC - Abnormal; Notable for the following components:      Result Value   APPearance HAZY (*)    Ketones, ur 40 (*)    Nitrite POSITIVE (*)    Leukocytes,Ua TRACE (*)    Bacteria, UA MANY (*)    All other components within  normal limits  URINE CULTURE    EKG   Radiology No results found.  Procedures Procedures (including critical care time)  Medications Ordered in UC Medications - No data to display  Initial Impression / Assessment and  Plan / UC Course  I have reviewed the triage vital signs and the nursing notes.  Pertinent labs & imaging results that were available during my care of the patient were reviewed by me and considered in my medical decision making (see chart for details).    24 year old female presents with dysuria.  Urinalysis revealed leukocytes and positive nitrite.  Microscopy showed many bacteria as well as pyuria with white blood cell clumps.  There were 11-20 squamous epithelial cells.  Concern for UTI.  Awaiting culture.  Placing empirically on Keflex.  Patient requested Diflucan for possible yeast infection associated with antibiotics.  I believe pelvic floor dysfunction is playing a role as well.  Advised to follow-up with urology to discuss referral to pelvic PT.  Final Clinical Impressions(s) / UC Diagnoses   Final diagnoses:  Dysuria  Pelvic floor dysfunction     Discharge Instructions     I am placing you on antibiotic while awaiting culture.  Call Urology to discuss referral to pelvic PT.  Take care  Dr. Adriana Simas    ED Prescriptions    Medication Sig Dispense Auth. Provider   cephALEXin (KEFLEX) 500 MG capsule Take 1 capsule (500 mg total) by mouth 2 (two) times daily. 14 capsule Rossetta Kama G, DO   fluconazole (DIFLUCAN) 150 MG tablet Take 1 tablet (150 mg total) by mouth once for 1 dose. Repeat dose in 72 hours. 2 tablet Tommie Sams, DO     PDMP not reviewed this encounter.   Tommie Sams, [place] 09/02/20 1023

## 2020-09-02 NOTE — Discharge Instructions (Signed)
I am placing you on antibiotic while awaiting culture.  Call Urology to discuss referral to pelvic PT.  Take care  Dr. Adriana Simas

## 2020-09-02 NOTE — ED Triage Notes (Signed)
Pt presents with c/o possible UTI. Pt states she has pain with urination and urine has an odor, pt states symptoms present for several days. Pt also reports she has PFD, pelvic floor dysfunction and is not sure if symptoms are coming from that. Pt states she is taking Azo since yesterday. Pt also reports she was seen recently for similar symptoms but was not given any meds. Pt states those results showed elevated WBC count. Pt wants to confirm she does not have a UTI.

## 2020-09-05 LAB — URINE CULTURE
Culture: 80000 — AB
Special Requests: NORMAL

## 2020-09-11 DIAGNOSIS — F419 Anxiety disorder, unspecified: Secondary | ICD-10-CM | POA: Diagnosis not present

## 2020-09-11 DIAGNOSIS — Z5181 Encounter for therapeutic drug level monitoring: Secondary | ICD-10-CM | POA: Diagnosis not present

## 2020-09-13 DIAGNOSIS — F322 Major depressive disorder, single episode, severe without psychotic features: Secondary | ICD-10-CM | POA: Diagnosis not present

## 2020-09-18 DIAGNOSIS — F419 Anxiety disorder, unspecified: Secondary | ICD-10-CM | POA: Diagnosis not present

## 2020-09-19 DIAGNOSIS — F419 Anxiety disorder, unspecified: Secondary | ICD-10-CM | POA: Diagnosis not present

## 2020-10-05 ENCOUNTER — Ambulatory Visit (INDEPENDENT_AMBULATORY_CARE_PROVIDER_SITE_OTHER): Payer: Medicaid Other | Admitting: Urology

## 2020-10-05 ENCOUNTER — Telehealth: Payer: Self-pay | Admitting: Urology

## 2020-10-05 ENCOUNTER — Other Ambulatory Visit: Payer: Self-pay

## 2020-10-05 ENCOUNTER — Encounter: Payer: Self-pay | Admitting: Urology

## 2020-10-05 ENCOUNTER — Other Ambulatory Visit: Payer: Self-pay | Admitting: Urology

## 2020-10-05 VITALS — BP 118/79 | HR 91 | Ht 63.0 in | Wt 130.0 lb

## 2020-10-05 DIAGNOSIS — R300 Dysuria: Secondary | ICD-10-CM | POA: Diagnosis not present

## 2020-10-05 DIAGNOSIS — N3289 Other specified disorders of bladder: Secondary | ICD-10-CM | POA: Diagnosis not present

## 2020-10-05 DIAGNOSIS — N941 Unspecified dyspareunia: Secondary | ICD-10-CM

## 2020-10-05 MED ORDER — HYOSCYAMINE SULFATE SL 0.125 MG SL SUBL: 0.1250 | SUBLINGUAL_TABLET | Freq: Four times a day (QID) | SUBLINGUAL | 1 refills | Status: DC | PRN

## 2020-10-05 NOTE — Telephone Encounter (Signed)
Pt calls office requesting same day appt for in her words " pelvic floor dysfunction"  C/o bladder pain, bladder spasms, incontinence and odor to urine. Pt denies fever/chills/nausea/vomiting/back pain. Added on per triage Maralyn Sago)

## 2020-10-06 ENCOUNTER — Other Ambulatory Visit: Payer: Self-pay | Admitting: Urology

## 2020-10-06 DIAGNOSIS — M6289 Other specified disorders of muscle: Secondary | ICD-10-CM

## 2020-10-06 LAB — URINALYSIS, COMPLETE
Bilirubin, UA: NEGATIVE
Glucose, UA: NEGATIVE
Ketones, UA: NEGATIVE
Leukocytes,UA: NEGATIVE
Nitrite, UA: NEGATIVE
Protein,UA: NEGATIVE
Specific Gravity, UA: 1.02 (ref 1.005–1.030)
Urobilinogen, Ur: 0.2 mg/dL (ref 0.2–1.0)
pH, UA: 7 (ref 5.0–7.5)

## 2020-10-06 LAB — MICROSCOPIC EXAMINATION: Bacteria, UA: NONE SEEN

## 2020-10-09 ENCOUNTER — Ambulatory Visit: Payer: Medicaid Other | Admitting: Physical Therapy

## 2020-10-09 DIAGNOSIS — F322 Major depressive disorder, single episode, severe without psychotic features: Secondary | ICD-10-CM | POA: Diagnosis not present

## 2020-10-10 LAB — CULTURE, URINE COMPREHENSIVE

## 2020-10-11 DIAGNOSIS — F322 Major depressive disorder, single episode, severe without psychotic features: Secondary | ICD-10-CM | POA: Diagnosis not present

## 2020-10-12 ENCOUNTER — Ambulatory Visit: Payer: Medicaid Other | Attending: Urology | Admitting: Physical Therapy

## 2020-10-12 ENCOUNTER — Encounter: Payer: Self-pay | Admitting: Physical Therapy

## 2020-10-12 ENCOUNTER — Other Ambulatory Visit: Payer: Self-pay

## 2020-10-12 DIAGNOSIS — R278 Other lack of coordination: Secondary | ICD-10-CM | POA: Diagnosis present

## 2020-10-12 DIAGNOSIS — M62838 Other muscle spasm: Secondary | ICD-10-CM | POA: Insufficient documentation

## 2020-10-12 DIAGNOSIS — R102 Pelvic and perineal pain: Secondary | ICD-10-CM

## 2020-10-12 NOTE — Therapy (Signed)
[facility] [address]. [place], [place], [address] Phone: [phone]   Fax:  [phone]  Physical Therapy Evaluation  Patient Details  Name: Chelsea Parks MRN: [id number] Date of Birth: 1996-05-05 Referring Provider (PT): Michiel Cowboy   Encounter Date: 10/12/2020   PT End of Session - 10/12/20 1453    Visit Number 1    Number of Visits 6    Date for PT Re-Evaluation 01/04/21    PT Start Time 1000    PT Stop Time 1055    PT Time Calculation (min) 55 min    Activity Tolerance Patient tolerated treatment well    Behavior During Therapy [facility] for tasks assessed/performed           Past Medical History:  Diagnosis Date  . Anxiety   . Bipolar disorder (HCC) 2019  . Depression   . H/O postpartum depression, currently pregnant   . Herpes genitalis in women   . Pelvic floor dysfunction in female 10/2019    Past Surgical History:  Procedure Laterality Date  . CERVICAL BIOPSY    . no surgical history      There were no vitals filed for this visit.    Subjective Assessment - 10/12/20 1012    Subjective Patient presents to clinic regarding urinary symptoms. She notes she has bladder spasms which will prompt minimal leakage. Patient notes that after leakage she has a whole body sensation that is uncomfortable and she doesn't know how to explain it. She has correlated her bladder symptoms with her premenstrual cycle. She is able to lay down and do diaphragmatic breathing for pain modulation with good success. In the moment, however she is not getting any relief from prescription/OTC medications. Patient notes that she is presently seeing psychology and psychiatry and feels well-managed after several bouts of trial medications with adverse side effects. Patient notes that she has found her PFM stretches and diaphragmatic breathing to be really helpful. Patient would like to know what to do in these situations because she feels  very overwhelmed and panics when she has pain intensity.    Pertinent History Patient has participated in PFPT previously for pelvic pain and difficulty urinating as well as constipation. Over the course of previous PFPT episode (11 visits) patient achieved a 200 point reduction in PFDI-20 and a 30 point reduction on NIH-CPSI Female measures. Patient achieved all goals with last episode of care and demonstrated excellent compliance and application of pain management and body mechanic principles.    Currently in Pain? Yes    Pain Descriptors / Indicators Burning    Aggravating Factors  standing, high stress, menstruation    Pain Relieving Factors diaphragmatic breathing              [facility] PT Assessment - 10/12/20 1640      Assessment   Medical Diagnosis Pelvic Floor Dysfunction    Referring Provider (PT) Michiel Cowboy    Hand Dominance Right    Prior Therapy Yes      Balance Screen   Has the patient fallen in the past 6 months No           OBJECTIVE  Mental Status Patient is oriented to person, place and time.  Recent memory is intact.  Remote memory is intact.  Attention span and concentration are intact.  Expressive speech is intact.  Patient's fund of knowledge is within normal limits for educational level.  POSTURE/OBSERVATIONS:  Lumbar lordosis: increased kyphosis throughout  spine likely contributing to decreased lumbar lordosis Standing: hyperextension of knees, largely reliant on joint congruency in the lower body with increased kyphosis and rounded shoulders  GAIT: Trendelenburg R: Negative L: Negative  RANGE OF MOTION: grossly WFL   LEFT RIGHT  Lumbar forward flexion (65):  WFL    Lumbar extension (30): WFL    Lumbar lateral flexion (25):  [facility]  Thoracic and Lumbar rotation (30 degrees):    [facility] WFL  Hip Flexion (0-125):   [facility] WFL  Hip IR (0-45):  [facility] WFL  Hip ER (0-45):  [facility] WFL  Hip Abduction (0-40):  [facility] WFL  Hip extension (0-15):  [facility] WFL      SENSATION: Grossly intact to light touch bilateral LEs as determined by testing dermatomes L2-S2 Proprioception and hot/cold testing deferred on this date  STRENGTH: MMT   RLE LLE  Hip Flexion 5 5  Hip Extension 5 5  Hip Abduction  5 5  Hip Adduction  5 5  Hip ER  5 5  Hip IR  5 5  Knee Extension 5 5  Knee Flexion 5 5  Dorsiflexion  5 5  Plantarflexion (seated) 5 5    PHYSICAL PERFORMANCE MEASURES: STS: WNL  EXTERNAL PELVIC EXAM: deferred 2/2 to time constraints Palpation: Breath coordination: Cued Lengthen: Cued Contraction: Cough:   JOINT MOBILITY Generally WNL throughout thoracic and lumbar spine. Coccyx appropriately mobile, but concordant pain with mobilization. TTP of ligamentous tissue at the coccyx and sacrum with hypomobility.    OUTCOME MEASURES: FOTO: Urinary 68    ASSESSMENT Patient is a 24 year old presenting to clinic with chief complaints of pelvic pain and urinary leakage. Upon examination, patient demonstrates deficits in posture, pain, body mechanics, and PFM extensibility as evidenced by diminished lordosis and increased kyphosis, worst pain 10/10 with bladder spasm, hypomobility at sacral ligaments and hyperflexed coccyx (although determined to be non-obstructive by radiology). Patient's concordant pelvic pain was produced with coccyx and sacral mobilizations suggesting some MSK involvement with current state. Patient's responses on FOTO outcome measures (Urinary 69) indicate moderate functional limitations/disability/distress. Patient's progress may be limited due to persistent nature of pain state; however, patient's motivation and past success with PFPT are highly advantageous. Patient will benefit from continued skilled therapeutic intervention to address deficits in posture, pain, PFM extensibility, and body mechanics in order to increase function and improve overall QOL.  EDUCATION Patient educated on prognosis, POC, and provided with HEP  including: child's pose rocking, prone hip IR. Patient articulated understanding and returned demonstration. Patient will benefit from further education in order to maximize compliance and understanding for long-term therapeutic gains.   Objective measurements completed on examination: See above findings.          PT Long Term Goals - 10/12/20 1655      PT LONG TERM GOAL #1   Title Patient will demonstrate improved function as evidenced by a score of 74 on FOTO measure for full participation in activities at home and in the community.    Baseline IE: 69    Time 12    Period Weeks    Status New    Target Date 01/04/21      PT LONG TERM GOAL #2   Title Patient will report "no difficulty" with household or entertainment activities due to urinary problem on PFDI Urinary measure to indicate improved participation at home and in the community.    Baseline IE: "a little difficulty"    Time 12  Period Weeks    Status New    Target Date 01/04/21      PT LONG TERM GOAL #3   Title Patient will demonstrate improved PFM coordination and strength as evidenced by ability to delay urination for 30+ minutes in order to participate fully at home and in the community.    Baseline IE: < 10 min    Time 12    Period Weeks    Status New    Target Date 01/04/21      PT LONG TERM GOAL #4   Title Patient will demonstrate circumferential and sequential contraction of >4/5 MMT, > 6 sec hold x10 and 5 consecutive quick flicks with </= 10 min rest between testing bouts, and relaxation of the PFM coordinated with breath for improved management of intra-abdominal pressure and normal bowel and bladder function without the presence of pain nor incontinence in order to improve participation at home and in the community.    Baseline IE: not demonstrated    Time 12    Period Weeks    Status New    Target Date 01/04/21      PT LONG TERM GOAL #5   Title Patient will demonstrate coordinated lengthening and  relaxation of PFM with diaphragmatic inhalation in order to decrease spasm and allow for unrestricted elimination of urine/feces for improved overall QOL.    Baseline IE: not demonstrated    Time 12    Period Weeks    Status New    Target Date 01/04/21                  Plan - 10/12/20 1453    Clinical Impression Statement Patient is a 24 year old presenting to clinic with chief complaints of pelvic pain and urinary leakage. Upon examination, patient demonstrates deficits in posture, pain, body mechanics, and PFM extensibility as evidenced by diminished lordosis and increased kyphosis, worst pain 10/10 with bladder spasm, hypomobility at sacral ligaments and hyperflexed coccyx (although determined to be non-obstructive by radiology). Patient's concordant pelvic pain was produced with coccyx and sacral mobilizations suggesting some MSK involvement with current state. Patient's responses on FOTO outcome measures (Urinary 69) indicate moderate functional limitations/disability/distress. Patient's progress may be limited due to persistent nature of pain state; however, patient's motivation and past success with PFPT are highly advantageous. Patient will benefit from continued skilled therapeutic intervention to address deficits in posture, pain, PFM extensibility, and body mechanics in order to increase function and improve overall QOL.    Personal Factors and Comorbidities Age;Behavior Pattern;Comorbidity 3+;Time since onset of injury/illness/exacerbation;Past/Current Experience    Comorbidities anxiety, bipolar disorder, PTSD    Examination-Activity Limitations Stand;Toileting;Continence;Lift;Caring for Others    Examination-Participation Restrictions Occupation;Cleaning;Meal Prep;Interpersonal Relationship    Stability/Clinical Decision Making Evolving/Moderate complexity    Clinical Decision Making Moderate    Rehab Potential Fair    PT Frequency Biweekly    PT Duration 12 weeks    PT  Treatment/Interventions Dry needling;Joint Manipulations;Spinal Manipulations;Manual techniques;Neuromuscular re-education;Therapeutic exercise;Therapeutic activities;Electrical Stimulation;Biofeedback;Moist Heat;Cryotherapy;ADLs/Self Care Home Management;Orthotic Fit/Training;Patient/family education;Taping    PT Next Visit Plan manual as needed, PFM relaxation techniques    PT Home Exercise Plan child's pose rocking, prone hip IR    Consulted and Agree with Plan of Care Patient           Patient will benefit from skilled therapeutic intervention in order to improve the following deficits and impairments:  Increased muscle spasms,Pain,Improper body mechanics,Postural dysfunction,Decreased strength,Impaired flexibility,Decreased coordination  Visit Diagnosis: Other muscle  spasm  Other lack of coordination  Pelvic pain     Problem List Patient Active Problem List   Diagnosis Date Noted  . Coccyx pain 03/23/2020  . PTSD (post-traumatic stress disorder) 12/20/2019  . Pelvic floor dysfunction 12/20/2019  . HSV infection 09/09/2018  . Bipolar 1 disorder (HCC) 09/10/2017   Sheria Lang PT, DPT [phone]  10/12/2020, 4:57 PM  [facility] [address] [place], [place], [address] Phone: [phone]   Fax:  [phone]  Name: Chelsea Parks MRN: [id number] Date of Birth: 1996/09/29

## 2020-10-15 ENCOUNTER — Encounter: Payer: Self-pay | Admitting: Urology

## 2020-10-15 NOTE — Progress Notes (Signed)
10/05/2020 3:08 PM   Chelsea Parks [birth year] [id number]  Referring provider: Marjie Skiff, NP [address] [place],  [place] [address]  Chief Complaint  Patient presents with  . Dysuria    HPI: Chelsea Parks is a 24 year old female with dysuria/rUTI's and pelvic floor dysfunction who presents today requesting an urgent appointment for bladder spasms.    She was seen initially on December 08, 2019 with Dr. Apolinar Junes.  At that appointment, her history was reviewed and lengthy discussion took place regarding her symptoms as they were occurring often times with negative urine cultures.   She was advised to contact the office when she was experiencing symptoms so that we could acquire same-day UA/urine evaluation.  She was also referred on to pelvic floor physical therapy.  She has had six therapy sessions with Kathryne Eriksson, PT.    She was also seen at [facility] for injury to her coccyx.  CT and MRI of the pelvis did not note any abnormalities.  She returned to our office on August 15, 2020 for symptoms of frequency and spasming pain at the beginning of urination and was seen by Hilton Sinclair, PA-C.   Her in office UA was pan negative.  Her urine culture was positive for mixed urogenital flora.  She was also encouraged to resume her at home pelvic floor therapy exercises.  At her visit on 08/28/2020, she started to experience the intense bladder spasms again.  Her UA was positive for 21-50 WBCs.  Her PVR was 96 mL.  She was also experiencing dyspareunia.  She denied any frictional pain with intercourse, but she was experiencing vaginal pain with her partner's thrusting's.  Patient denied any modifying or aggravating factors.  Patient denied any gross hematuria or suprapubic/flank pain.  Patient denied any fevers, chills, nausea or vomiting.  Her urine culture had insignificant growth.   She sought treatment on 09/02/2020 for pain with urination at [facility].  Her UA was nitrite  positive, 11-20 squames, > 50 WBC's, many bacteria and WBC clumps.  Her urine culture was positive for pan-sensitive E. Coli.  She was given Keflex 500 mg, BID x 7 days and Diflucan 150 mg, two tablets.    Today, she continues to experience bladder spasms, incontinence and difficulty urinating.  She has noticed that the symptoms seem to start at the end of her birth control pill cycle.    She has also contacted the physical therapy department and has scheduled therapy sessions.    She has an upcoming appointment at [facility] pelvic pain clinic on 10/31/2020  Her UA today is negative.     PMH: Past Medical History:  Diagnosis Date  . Anxiety   . Bipolar disorder (HCC) 2019  . Depression   . H/O postpartum depression, currently pregnant   . Herpes genitalis in women   . Pelvic floor dysfunction in female 10/2019    Surgical History: Past Surgical History:  Procedure Laterality Date  . CERVICAL BIOPSY    . no surgical history      Home Medications:  Allergies as of 10/05/2020      Reactions   Amoxicillin Diarrhea      Medication List       Accurate as of October 05, 2020 11:59 PM. If you have any questions, ask your nurse or doctor.        STOP taking these medications   cephALEXin 500 MG capsule Commonly known as: KEFLEX Stopped by:  Cortni Tays, PA-C   Uribel 118 MG Caps Stopped by: Sherae Santino, PA-C     TAKE these medications   clonazePAM 0.5 MG tablet Commonly known as: KLONOPIN Take 0.5 mg by mouth daily as needed.   hyoscyamine 0.125 MG SL tablet Commonly known as: LEVSIN SL PLACE 0.125 TABLETS UNDER THE TONGUE EVERY 6 (SIX) HOURS AS NEEDED. Started by: Michiel Cowboy, PA-C   multivitamin tablet Take by mouth.   Norethindrone Acetate-Ethinyl Estradiol 1.5-30 MG-MCG tablet Commonly known as: LOESTRIN Take 1 tablet by mouth daily.   valACYclovir 500 MG tablet Commonly known as: Valtrex Take 1 tablet (500 mg total) by mouth daily.   Vraylar  capsule Generic drug: cariprazine Take 1.5 mg by mouth daily.       Allergies:  Allergies  Allergen Reactions  . Amoxicillin Diarrhea    Family History: Family History  Problem Relation Age of Onset  . Hypertension Mother   . Healthy Father   . Pancreatic cancer Maternal Uncle   . Stroke Maternal Grandfather   . Cancer Maternal Grandfather   . Lupus Paternal Aunt   . GER disease Paternal Grandmother   . Kidney cancer Neg Hx     Social History:  reports that she has quit smoking. She has never used smokeless tobacco. She reports previous alcohol use. She reports that she does not use drugs.  ROS: Pertinent ROS in HPI  Physical Exam: BP 118/79   Pulse 91   Ht 5\' 3"  (1.6 m)   Wt 130 lb (59 kg)   LMP 10/05/2020   BMI 23.03 kg/m   Constitutional:  Well nourished. Alert and oriented, No acute distress. HEENT: [place] AT, mask in place.  Trachea midline Cardiovascular: No clubbing, cyanosis, or edema. Respiratory: Normal respiratory effort, no increased work of breathing. Neurologic: Grossly intact, no focal deficits, moving all 4 extremities. Psychiatric: Normal mood and affect.   Laboratory Data: Lab Results  Component Value Date   WBC 11.1 (H) 06/04/2020   HGB 11.9 (L) 06/04/2020   HCT 35.5 (L) 06/04/2020   MCV 97.3 06/04/2020   PLT 208 06/04/2020   Lab Results  Component Value Date   CREATININE 0.70 06/04/2020   Lab Results  Component Value Date   TSH 0.490 12/20/2019   Lab Results  Component Value Date   AST 19 06/04/2020   Lab Results  Component Value Date   ALT 14 06/04/2020    Urinalysis Component     Latest Ref Rng & Units 10/05/2020  Specific Gravity, UA     1.005 - 1.030 1.020  pH, UA     5.0 - 7.5 7.0  Color, UA     Yellow Yellow  Appearance Ur     Clear Clear  Leukocytes,UA     Negative Negative  Protein,UA     Negative/Trace Negative  Glucose, UA     Negative Negative  Ketones, UA     Negative Negative  RBC, UA     Negative  Trace (A)  Bilirubin, UA     Negative Negative  Urobilinogen, Ur     0.2 - 1.0 mg/dL 0.2  Nitrite, UA     Negative Negative  Microscopic Examination      See below:   Component     Latest Ref Rng & Units 10/05/2020  WBC, UA     0 - 5 /hpf 0-5  RBC     0 - 2 /hpf 0-2  Epithelial Cells (non renal)  0 - 10 /hpf 0-10  Bacteria, UA     None seen/Few None seen   I have reviewed the labs.  Pertinent Imaging: Results for ROBBYN, HODKINSON (MRN [id number]) as of 09/13/2020 11:38  Ref. Range 08/28/2020 14:14  Scan Result Unknown 17ml    Assessment & Plan:   1. Bladder spasms - Urinalysis, Complete - benign  - CULTURE, URINE COMPREHENSIVE - patient is still with symptoms - we discussed speaking with her gynecologist about changing her BC pills to ones that allow for less frequent menstrual cycles - she will be starting her pelvic PT soon - she has an upcoming appointment with the [facility] for Vulvar and Urogenital pain  2.  Dyspareunia - see above  Return for keep follow ups with PT and [facility] .  These notes generated with voice recognition software. I apologize for typographical errors.  Michiel Cowboy, PA-C  [facility] [address]  [address] [place], [place] [address] [phone]  I spent 30 minutes on the day of the encounter to include pre-visit record review, face-to-face time with the patient, and post-visit ordering of tests.

## 2020-10-18 ENCOUNTER — Other Ambulatory Visit: Payer: Self-pay

## 2020-10-18 ENCOUNTER — Encounter: Payer: Self-pay | Admitting: Obstetrics and Gynecology

## 2020-10-18 ENCOUNTER — Ambulatory Visit (INDEPENDENT_AMBULATORY_CARE_PROVIDER_SITE_OTHER): Payer: Medicaid Other | Admitting: Obstetrics and Gynecology

## 2020-10-18 VITALS — BP 101/69 | HR 96 | Ht 63.0 in | Wt 126.3 lb

## 2020-10-18 DIAGNOSIS — M6289 Other specified disorders of muscle: Secondary | ICD-10-CM | POA: Diagnosis not present

## 2020-10-18 DIAGNOSIS — Z3009 Encounter for other general counseling and advice on contraception: Secondary | ICD-10-CM

## 2020-10-18 DIAGNOSIS — F322 Major depressive disorder, single episode, severe without psychotic features: Secondary | ICD-10-CM | POA: Diagnosis not present

## 2020-10-18 MED ORDER — DEPO-SUBQ PROVERA 104 104 MG/0.65ML ~~LOC~~ SUSY: 104.0000 mg | PREFILLED_SYRINGE | SUBCUTANEOUS | 3 refills | Status: DC

## 2020-10-18 NOTE — Progress Notes (Signed)
Pt present to discuss other forms of birth control. Pt stated having pelvic spasms during her cycles and she would like some form of birth control with lesser cycles.

## 2020-10-18 NOTE — Progress Notes (Signed)
    GYNECOLOGY PROGRESS NOTE  Subjective:    Patient ID: Chelsea Parks, female    DOB: 1996-05-17, 24 y.o.   MRN: [id number]  HPI  Patient is a 24 y.o. [id number] female who presents for discussion of contraception options. She  desires to decrease the frequency of her cycles as she is noting pelvic spasms (in the vagina) with each cycle. She has also resumed seeing a pelvic floor physical therapist for pelvic floor issues. Is considering using Depo Provera injections. Had used Nexplanon in the past but reports having issues with it. Has used Depo Provera in the past, with no concerning issues.   The following portions of the patient's history were reviewed and updated as appropriate: allergies, current medications, past family history, past medical history, past social history, past surgical history and problem list.  Review of Systems Pertinent items noted in HPI and remainder of comprehensive ROS otherwise negative.   Objective:   Blood pressure 101/69, pulse 96, height 5\' 3"  (1.6 m), weight 126 lb 4.8 oz (57.3 kg), last menstrual period 10/05/2020, not currently breastfeeding. General appearance: alert and no distress Remainder of exam deferred.   Assessment:   Encounter for counseling regarding contraception Pelvic floor dysfunction in female  Plan:   1. Patient desires to change contraceptive options, reviewed all other options for contraception to decrease frequency of cycles, including continuous therapy OCPs, LARC. Patient still desires to use Depo Provera. Will prescribe. Patient desires form that she can inject herself as she has a family member who is a 14/06/2020. Discussed importance of remembering timing for each dose, advised to set an alarm.  2. Continue pelvic physical therapy as indicated.   Follow up in 3-4 months to reassess symptoms.    Engineer, civil (consulting), MD [facility]

## 2020-10-25 DIAGNOSIS — F322 Major depressive disorder, single episode, severe without psychotic features: Secondary | ICD-10-CM | POA: Diagnosis not present

## 2020-10-26 ENCOUNTER — Ambulatory Visit: Payer: Medicaid Other | Admitting: Physical Therapy

## 2020-10-26 ENCOUNTER — Encounter: Payer: Self-pay | Admitting: Physical Therapy

## 2020-10-26 ENCOUNTER — Other Ambulatory Visit: Payer: Self-pay

## 2020-10-26 DIAGNOSIS — M62838 Other muscle spasm: Secondary | ICD-10-CM

## 2020-10-26 DIAGNOSIS — R278 Other lack of coordination: Secondary | ICD-10-CM

## 2020-10-26 DIAGNOSIS — R102 Pelvic and perineal pain: Secondary | ICD-10-CM

## 2020-10-26 NOTE — Therapy (Signed)
[facility] [address] [place], [place], [address] Phone: [phone]   Fax:  [phone]  Physical Therapy Treatment  Patient Details  Name: Chelsea Parks MRN: [id number] Date of Birth: Jan 25, 1996 Referring Provider (PT): Zara Council   Encounter Date: 10/26/2020   PT End of Session - 10/26/20 1105    Visit Number 2    Number of Visits 6    Date for PT Re-Evaluation 01/04/21    PT Start Time 1058    PT Stop Time 1155    PT Time Calculation (min) 57 min    Activity Tolerance Patient tolerated treatment well    Behavior During Therapy [facility] for tasks assessed/performed           Past Medical History:  Diagnosis Date  . Anxiety   . Bipolar disorder ([place]) 2019  . Depression   . H/O postpartum depression, currently pregnant   . Herpes genitalis in women   . Pelvic floor dysfunction in female 10/2019    Past Surgical History:  Procedure Laterality Date  . CERVICAL BIOPSY    . no surgical history      There were no vitals filed for this visit.   Subjective Assessment - 10/26/20 1059    Subjective Patient notes that she met with GYN and is going to try a new birth control (DEPO). Patient notes that she has not had any bladder spasms since evaluation, but has had some pain and trouble starting the flow of urination. She also notes that with the holidays and different events she has had to delay urination some which has factored into the pain and difficulty initiating urination.    Pertinent History Patient has participated in PFPT previously for pelvic pain and difficulty urinating as well as constipation. Over the course of previous PFPT episode (11 visits) patient achieved a 200 point reduction in PFDI-20 and a 30 point reduction on NIH-CPSI Female measures. Patient achieved all goals with last episode of care and demonstrated excellent compliance and application of pain management and body mechanic principles.     Currently in Pain? No/denies          TREATMENT  Manual Therapy: MFR of abdominal wall, Scarpa/Camper's fascia for decreased pain and tension Sacral border mobilizations, B, for improved pain and mobility Superior coccyx mobilizations with movement for decreased spasm and improved mobility, grade II/III STM and TPR performed to B gluteal complexes to allow for decreased tension and pain and improved posture and function  Neuromuscular Re-education: Supine hooklying diaphragmatic breathing with VCs and TCs for downregulation of the nervous system and improved management of IAP Supine DRAM corrections with coordinated breath for improved IAP management Patient education on lower abdominal breathing and importance of stretching fascia for decreased pressure/pain   Patient educated throughout session on appropriate technique and form using multi-modal cueing, HEP, and activity modification. Patient articulated understanding and returned demonstration.  Patient Response to interventions: Notes comfort with HEP  ASSESSMENT Patient presents to clinic with excellent motivation to participate in therapy. Patient demonstrates deficits in posture, pain, body mechanics, and PFM extensibility. Patient able to achieve understanding of MFR self-practice and demonstrated competence during today's session and responded positively to active and educational interventions. Patient will benefit from continued skilled therapeutic intervention to address remaining deficits in posture, pain, body mechanics, and PFM extensibility in order to increase function and improve overall QOL.     PT Long Term Goals - 10/12/20 1655  PT LONG TERM GOAL #1   Title Patient will demonstrate improved function as evidenced by a score of 74 on FOTO measure for full participation in activities at home and in the community.    Baseline IE: 69    Time 12    Period Weeks    Status New    Target Date 01/04/21      PT  LONG TERM GOAL #2   Title Patient will report "no difficulty" with household or entertainment activities due to urinary problem on PFDI Urinary measure to indicate improved participation at home and in the community.    Baseline IE: "a little difficulty"    Time 12    Period Weeks    Status New    Target Date 01/04/21      PT LONG TERM GOAL #3   Title Patient will demonstrate improved PFM coordination and strength as evidenced by ability to delay urination for 30+ minutes in order to participate fully at home and in the community.    Baseline IE: < 10 min    Time 12    Period Weeks    Status New    Target Date 01/04/21      PT LONG TERM GOAL #4   Title Patient will demonstrate circumferential and sequential contraction of >4/5 MMT, > 6 sec hold x10 and 5 consecutive quick flicks with </= 10 min rest between testing bouts, and relaxation of the PFM coordinated with breath for improved management of intra-abdominal pressure and normal bowel and bladder function without the presence of pain nor incontinence in order to improve participation at home and in the community.    Baseline IE: not demonstrated    Time 12    Period Weeks    Status New    Target Date 01/04/21      PT LONG TERM GOAL #5   Title Patient will demonstrate coordinated lengthening and relaxation of PFM with diaphragmatic inhalation in order to decrease spasm and allow for unrestricted elimination of urine/feces for improved overall QOL.    Baseline IE: not demonstrated    Time 12    Period Weeks    Status New    Target Date 01/04/21                 Plan - 10/26/20 1105    Clinical Impression Statement Patient presents to clinic with excellent motivation to participate in therapy. Patient demonstrates deficits in posture, pain, body mechanics, and PFM extensibility. Patient able to achieve understanding of MFR self-practice and demonstrated competence during today's session and responded positively to active and  educational interventions. Patient will benefit from continued skilled therapeutic intervention to address remaining deficits in posture, pain, body mechanics, and PFM extensibility in order to increase function and improve overall QOL.    Personal Factors and Comorbidities Age;Behavior Pattern;Comorbidity 3+;Time since onset of injury/illness/exacerbation;Past/Current Experience    Comorbidities anxiety, bipolar disorder, PTSD    Examination-Activity Limitations Stand;Toileting;Continence;Lift;Caring for Others    Examination-Participation Restrictions Occupation;Cleaning;Meal Prep;Interpersonal Relationship    Stability/Clinical Decision Making Evolving/Moderate complexity    Rehab Potential Fair    PT Frequency Biweekly    PT Duration 12 weeks    PT Treatment/Interventions Dry needling;Joint Manipulations;Spinal Manipulations;Manual techniques;Neuromuscular re-education;Therapeutic exercise;Therapeutic activities;Electrical Stimulation;Biofeedback;Moist Heat;Cryotherapy;ADLs/Self Care Home Management;Orthotic Fit/Training;Patient/family education;Taping    PT Next Visit Plan manual as needed, PFM relaxation techniques    PT Home Exercise Plan child's pose rocking, prone hip IR    Consulted and Agree with Plan of   Care Patient           Patient will benefit from skilled therapeutic intervention in order to improve the following deficits and impairments:  Increased muscle spasms,Pain,Improper body mechanics,Postural dysfunction,Decreased strength,Impaired flexibility,Decreased coordination  Visit Diagnosis: Other muscle spasm  Other lack of coordination  Pelvic pain     Problem List Patient Active Problem List   Diagnosis Date Noted  . Coccyx pain 03/23/2020  . PTSD (post-traumatic stress disorder) 12/20/2019  . Pelvic floor dysfunction 12/20/2019  . HSV infection 09/09/2018  . Bipolar 1 disorder (HCC) 09/10/2017     PT, DPT #[id number]  10/26/2020, 12:43 PM  [facility] [address] [place], [place], [address] Phone: [phone]   Fax:  [phone]  Name: Chelsea Parks MRN: [id number] Date of Birth: 09/09/1996   

## 2020-11-06 DIAGNOSIS — F419 Anxiety disorder, unspecified: Secondary | ICD-10-CM | POA: Diagnosis not present

## 2020-11-08 DIAGNOSIS — F419 Anxiety disorder, unspecified: Secondary | ICD-10-CM | POA: Diagnosis not present

## 2020-11-09 ENCOUNTER — Encounter: Payer: Self-pay | Admitting: Physical Therapy

## 2020-11-09 ENCOUNTER — Other Ambulatory Visit: Payer: Self-pay

## 2020-11-09 ENCOUNTER — Ambulatory Visit: Payer: Medicaid Other | Attending: Urology | Admitting: Physical Therapy

## 2020-11-09 DIAGNOSIS — R278 Other lack of coordination: Secondary | ICD-10-CM | POA: Insufficient documentation

## 2020-11-09 DIAGNOSIS — M62838 Other muscle spasm: Secondary | ICD-10-CM

## 2020-11-09 DIAGNOSIS — R102 Pelvic and perineal pain: Secondary | ICD-10-CM | POA: Diagnosis present

## 2020-11-09 NOTE — Therapy (Signed)
[facility] [address] [place], [place], [address] Phone: [phone]   Fax:  [phone]  Physical Therapy Treatment  Patient Details  Name: Chelsea Parks MRN: [id number] Date of Birth: 1995/11/03 Referring Provider (PT): Michiel Cowboy   Encounter Date: 11/09/2020   PT End of Session - 11/09/20 1254    Visit Number 3    Number of Visits 6    Date for PT Re-Evaluation 01/04/21    Authorization Type 1/3    PT Start Time 1100    PT Stop Time 1155    PT Time Calculation (min) 55 min    Activity Tolerance Patient tolerated treatment well    Behavior During Therapy [facility] for tasks assessed/performed           Past Medical History:  Diagnosis Date  . Anxiety   . Bipolar disorder (HCC) 2019  . Depression   . H/O postpartum depression, currently pregnant   . Herpes genitalis in women   . Pelvic floor dysfunction in female 10/2019    Past Surgical History:  Procedure Laterality Date  . CERVICAL BIOPSY    . no surgical history      There were no vitals filed for this visit.   Subjective Assessment - 11/09/20 1103    Subjective Patient states that yesterday was a bad day. She has been having Type 7 BMs which she knows is related to constipation. She also notes that she is having increased pressure after voiding. Patient notes that she can perform a quick flick but cannot maintain the contraction in this scenario; she states that it feels like her body wants to be bearing down. Patient notes this sensation decreases when she stands from the toilet, but she will have UI with light activity and with no apparent reason after. Patient indicating a significant amount of emotional stress in her life at this time. She continues to follow with psychiatric and psychological care.    Pertinent History Patient has participated in PFPT previously for pelvic pain and difficulty urinating as well as constipation. Over the course of previous  PFPT episode (11 visits) patient achieved a 200 point reduction in PFDI-20 and a 30 point reduction on NIH-CPSI Female measures. Patient achieved all goals with last episode of care and demonstrated excellent compliance and application of pain management and body mechanic principles.           TREATMENT Neuromuscular Re-education: Patient education on nervous system downregulation/pain coping skills including: body scan, progressive relaxation, walking meditation, mindfulness practices. Patient education on strategies for more complete bladder emptying including: standing and sitting again; quick flicks followed by relaxation.   Patient educated throughout session on appropriate technique and form using multi-modal cueing, HEP, and activity modification. Patient articulated understanding and returned demonstration.  Patient Response to interventions: Notes comfort with HEP  ASSESSMENT Patient presents to clinic with excellent motivation to participate in therapy. Patient demonstrates deficits in posture, pain, body mechanics, and PFM extensibility. Patient appreciative of pain coping skills information during today's session and responded positively to active and educational interventions. Patient will benefit from continued skilled therapeutic intervention to address remaining deficits in posture, pain, body mechanics, and PFM extensibility in order to increase function and improve overall QOL.       PT Long Term Goals - 10/12/20 1655      PT LONG TERM GOAL #1   Title Patient will demonstrate improved function as evidenced by a score of 74 on FOTO  measure for full participation in activities at home and in the community.    Baseline IE: 69    Time 12    Period Weeks    Status New    Target Date 01/04/21      PT LONG TERM GOAL #2   Title Patient will report "no difficulty" with household or entertainment activities due to urinary problem on PFDI Urinary measure to indicate improved  participation at home and in the community.    Baseline IE: "a little difficulty"    Time 12    Period Weeks    Status New    Target Date 01/04/21      PT LONG TERM GOAL #3   Title Patient will demonstrate improved PFM coordination and strength as evidenced by ability to delay urination for 30+ minutes in order to participate fully at home and in the community.    Baseline IE: < 10 min    Time 12    Period Weeks    Status New    Target Date 01/04/21      PT LONG TERM GOAL #4   Title Patient will demonstrate circumferential and sequential contraction of >4/5 MMT, > 6 sec hold x10 and 5 consecutive quick flicks with </= 10 min rest between testing bouts, and relaxation of the PFM coordinated with breath for improved management of intra-abdominal pressure and normal bowel and bladder function without the presence of pain nor incontinence in order to improve participation at home and in the community.    Baseline IE: not demonstrated    Time 12    Period Weeks    Status New    Target Date 01/04/21      PT LONG TERM GOAL #5   Title Patient will demonstrate coordinated lengthening and relaxation of PFM with diaphragmatic inhalation in order to decrease spasm and allow for unrestricted elimination of urine/feces for improved overall QOL.    Baseline IE: not demonstrated    Time 12    Period Weeks    Status New    Target Date 01/04/21                 Plan - 11/09/20 1254    Clinical Impression Statement Patient presents to clinic with excellent motivation to participate in therapy. Patient demonstrates deficits in posture, pain, body mechanics, and PFM extensibility. Patient appreciative of pain coping skills information during today's session and responded positively to active and educational interventions. Patient will benefit from continued skilled therapeutic intervention to address remaining deficits in posture, pain, body mechanics, and PFM extensibility in order to increase  function and improve overall QOL.    Personal Factors and Comorbidities Age;Behavior Pattern;Comorbidity 3+;Time since onset of injury/illness/exacerbation;Past/Current Experience    Comorbidities anxiety, bipolar disorder, PTSD    Examination-Activity Limitations Stand;Toileting;Continence;Lift;Caring for Others    Examination-Participation Restrictions Occupation;Cleaning;Meal Prep;Interpersonal Relationship    Stability/Clinical Decision Making Evolving/Moderate complexity    Rehab Potential Fair    PT Frequency Biweekly    PT Duration 12 weeks    PT Treatment/Interventions Dry needling;Joint Manipulations;Spinal Manipulations;Manual techniques;Neuromuscular re-education;Therapeutic exercise;Therapeutic activities;Electrical Stimulation;Biofeedback;Moist Heat;Cryotherapy;ADLs/Self Care Home Management;Orthotic Fit/Training;Patient/family education;Taping    PT Next Visit Plan manual as needed, PFM relaxation techniques    PT Home Exercise Plan child's pose rocking, prone hip IR    Consulted and Agree with Plan of Care Patient           Patient will benefit from skilled therapeutic intervention in order to improve the following deficits  and impairments:  Increased muscle spasms,Pain,Improper body mechanics,Postural dysfunction,Decreased strength,Impaired flexibility,Decreased coordination  Visit Diagnosis: Other muscle spasm  Other lack of coordination  Pelvic pain     Problem List Patient Active Problem List   Diagnosis Date Noted  . Coccyx pain 03/23/2020  . PTSD (post-traumatic stress disorder) 12/20/2019  . Pelvic floor dysfunction 12/20/2019  . HSV infection 09/09/2018  . Bipolar 1 disorder (HCC) 09/10/2017   Sheria Lang PT, DPT [phone] 11/09/2020, 1:05 PM  [facility] [address] [place], [place], [address] Phone: [phone]   Fax:  [phone]  Name: Chelsea Parks MRN: [id number] Date of Birth:  07/08/1996

## 2020-11-22 DIAGNOSIS — F419 Anxiety disorder, unspecified: Secondary | ICD-10-CM | POA: Diagnosis not present

## 2020-11-23 ENCOUNTER — Encounter: Payer: Self-pay | Admitting: Physical Therapy

## 2020-11-23 ENCOUNTER — Other Ambulatory Visit: Payer: Self-pay

## 2020-11-23 ENCOUNTER — Ambulatory Visit: Payer: Medicaid Other | Admitting: Physical Therapy

## 2020-11-23 DIAGNOSIS — R278 Other lack of coordination: Secondary | ICD-10-CM | POA: Diagnosis not present

## 2020-11-23 DIAGNOSIS — R102 Pelvic and perineal pain: Secondary | ICD-10-CM | POA: Diagnosis not present

## 2020-11-23 DIAGNOSIS — M62838 Other muscle spasm: Secondary | ICD-10-CM

## 2020-11-23 NOTE — Therapy (Signed)
[facility] [address] [place], [place], [address] Phone: [phone]   Fax:  [phone]  Physical Therapy Treatment  Patient Details  Name: Chelsea Parks MRN: [id number] Date of Birth: [birth year] Referring Provider (PT): Michiel Cowboy   Encounter Date: 11/23/2020   PT End of Session - 11/23/20 1623    Visit Number 4    Number of Visits 6    Date for PT Re-Evaluation 01/04/21    Authorization Type 2/3    PT Start Time 1100    PT Stop Time 1200    PT Time Calculation (min) 60 min    Activity Tolerance Patient tolerated treatment well    Behavior During Therapy [facility] for tasks assessed/performed           Past Medical History:  Diagnosis Date  . Anxiety   . Bipolar disorder (HCC) 2019  . Depression   . H/O postpartum depression, currently pregnant   . Herpes genitalis in women   . Pelvic floor dysfunction in female 10/2019    Past Surgical History:  Procedure Laterality Date  . CERVICAL BIOPSY    . no surgical history      There were no vitals filed for this visit.   Subjective Assessment - 11/23/20 1057    Subjective Patient states that she feels her spasms are well controlled and she has not had any intense bladder related pain since last visit. She started her menstrual cycle 11/13/2020. She initially had heavy bleeding but has now transitioned to light bleeding (> than spotting) and continues to experience menstrual bleeding. Patient did have an intense somatic response recently when going to [facility] and had to be taken home by her partner. Patient noted that once she had a BM the sensation passed. Of note, patient also received an upsetting/stressful voicemail roughly an hour or so prior to the somatic response.    Pertinent History Patient has participated in PFPT previously for pelvic pain and difficulty urinating as well as constipation. Over the course of previous PFPT episode (11 visits) patient achieved a  200 point reduction in PFDI-20 and a 30 point reduction on NIH-CPSI Female measures. Patient achieved all goals with last episode of care and demonstrated excellent compliance and application of pain management and body mechanic principles.    Currently in Pain? No/denies           TREATMENT Neuromuscular Re-education: Patient education on somatic responses to stressors or relived trauma with use of Kennith Maes [employer] and work "[employer] the Score". Patient and PT brainstormed a checklist of questions for reflection regarding physical symptoms that may be experienced with heightened nervous system response to triggering events to better discern an embodied emotional response from an infection or MSK injury.   Patient educated throughout session on appropriate technique and form using multi-modal cueing, HEP, and activity modification. Patient articulated understanding and returned demonstration.  Patient Response to interventions: Expresses appreciation for checklist.  ASSESSMENT Patient presents to clinic with excellent motivation to participate in therapy. Patient demonstrates deficits in posture, pain, body mechanics, and PFM extensibility. Patient actively participating in creation of symptom checklist for improved management of pain and emotional state during today's session and responded positively to educational interventions. Patient will benefit from continued skilled therapeutic intervention to address remaining deficits in posture, pain, body mechanics, and PFM extensibility in order to increase function and improve overall QOL.     PT Long Term Goals - 10/12/20  1655      PT LONG TERM GOAL #1   Title Patient will demonstrate improved function as evidenced by a score of 74 on FOTO measure for full participation in activities at home and in the community.    Baseline IE: 69    Time 12    Period Weeks    Status New    Target Date 01/04/21      PT LONG TERM  GOAL #2   Title Patient will report "no difficulty" with household or entertainment activities due to urinary problem on PFDI Urinary measure to indicate improved participation at home and in the community.    Baseline IE: "a little difficulty"    Time 12    Period Weeks    Status New    Target Date 01/04/21      PT LONG TERM GOAL #3   Title Patient will demonstrate improved PFM coordination and strength as evidenced by ability to delay urination for 30+ minutes in order to participate fully at home and in the community.    Baseline IE: < 10 min    Time 12    Period Weeks    Status New    Target Date 01/04/21      PT LONG TERM GOAL #4   Title Patient will demonstrate circumferential and sequential contraction of >4/5 MMT, > 6 sec hold x10 and 5 consecutive quick flicks with </= 10 min rest between testing bouts, and relaxation of the PFM coordinated with breath for improved management of intra-abdominal pressure and normal bowel and bladder function without the presence of pain nor incontinence in order to improve participation at home and in the community.    Baseline IE: not demonstrated    Time 12    Period Weeks    Status New    Target Date 01/04/21      PT LONG TERM GOAL #5   Title Patient will demonstrate coordinated lengthening and relaxation of PFM with diaphragmatic inhalation in order to decrease spasm and allow for unrestricted elimination of urine/feces for improved overall QOL.    Baseline IE: not demonstrated    Time 12    Period Weeks    Status New    Target Date 01/04/21                 Plan - 11/23/20 1623    Clinical Impression Statement Patient presents to clinic with excellent motivation to participate in therapy. Patient demonstrates deficits in posture, pain, body mechanics, and PFM extensibility. Patient actively participating in creation of symptom checklist for improved management of pain and emotional state during today's session and responded  positively to educational interventions. Patient will benefit from continued skilled therapeutic intervention to address remaining deficits in posture, pain, body mechanics, and PFM extensibility in order to increase function and improve overall QOL.    Personal Factors and Comorbidities Age;Behavior Pattern;Comorbidity 3+;Time since onset of injury/illness/exacerbation;Past/Current Experience    Comorbidities anxiety, bipolar disorder, PTSD    Examination-Activity Limitations Stand;Toileting;Continence;Lift;Caring for Others    Examination-Participation Restrictions Occupation;Cleaning;Meal Prep;Interpersonal Relationship    Stability/Clinical Decision Making Evolving/Moderate complexity    Rehab Potential Fair    PT Frequency Biweekly    PT Duration 12 weeks    PT Treatment/Interventions Dry needling;Joint Manipulations;Spinal Manipulations;Manual techniques;Neuromuscular re-education;Therapeutic exercise;Therapeutic activities;Electrical Stimulation;Biofeedback;Moist Heat;Cryotherapy;ADLs/Self Care Home Management;Orthotic Fit/Training;Patient/family education;Taping    PT Next Visit Plan manual as needed, PFM relaxation techniques    PT Home Exercise Plan child's pose rocking, prone hip IR  Consulted and Agree with Plan of Care Patient           Patient will benefit from skilled therapeutic intervention in order to improve the following deficits and impairments:  Increased muscle spasms,Pain,Improper body mechanics,Postural dysfunction,Decreased strength,Impaired flexibility,Decreased coordination  Visit Diagnosis: Other muscle spasm  Other lack of coordination  Pelvic pain     Problem List Patient Active Problem List   Diagnosis Date Noted  . Coccyx pain 03/23/2020  . PTSD (post-traumatic stress disorder) 12/20/2019  . Pelvic floor dysfunction 12/20/2019  . HSV infection 09/09/2018  . Bipolar 1 disorder (HCC) 09/10/2017   Sheria Lang PT, DPT [phone]  11/23/2020, 4:29  PM  [facility] [address] [place], [place], [address] Phone: [phone]   Fax:  [phone]  Name: Chelsea Parks MRN: [id number] Date of Birth: 1996/08/21

## 2020-11-27 ENCOUNTER — Ambulatory Visit: Payer: Medicaid Other | Admitting: Nurse Practitioner

## 2020-12-01 DIAGNOSIS — F419 Anxiety disorder, unspecified: Secondary | ICD-10-CM | POA: Diagnosis not present

## 2020-12-04 DIAGNOSIS — F419 Anxiety disorder, unspecified: Secondary | ICD-10-CM | POA: Diagnosis not present

## 2020-12-06 DIAGNOSIS — F322 Major depressive disorder, single episode, severe without psychotic features: Secondary | ICD-10-CM | POA: Diagnosis not present

## 2020-12-07 ENCOUNTER — Encounter: Payer: Medicaid Other | Admitting: Physical Therapy

## 2020-12-12 DIAGNOSIS — F322 Major depressive disorder, single episode, severe without psychotic features: Secondary | ICD-10-CM | POA: Diagnosis not present

## 2020-12-13 DIAGNOSIS — F419 Anxiety disorder, unspecified: Secondary | ICD-10-CM | POA: Diagnosis not present

## 2020-12-14 ENCOUNTER — Ambulatory Visit: Payer: Medicaid Other | Admitting: Physical Therapy

## 2020-12-18 DIAGNOSIS — F322 Major depressive disorder, single episode, severe without psychotic features: Secondary | ICD-10-CM | POA: Diagnosis not present

## 2020-12-20 DIAGNOSIS — F322 Major depressive disorder, single episode, severe without psychotic features: Secondary | ICD-10-CM | POA: Diagnosis not present

## 2020-12-27 DIAGNOSIS — F322 Major depressive disorder, single episode, severe without psychotic features: Secondary | ICD-10-CM | POA: Diagnosis not present

## 2021-01-01 DIAGNOSIS — F419 Anxiety disorder, unspecified: Secondary | ICD-10-CM | POA: Diagnosis not present

## 2021-01-03 DIAGNOSIS — F322 Major depressive disorder, single episode, severe without psychotic features: Secondary | ICD-10-CM | POA: Diagnosis not present

## 2021-01-09 DIAGNOSIS — F419 Anxiety disorder, unspecified: Secondary | ICD-10-CM | POA: Diagnosis not present

## 2021-01-10 DIAGNOSIS — F322 Major depressive disorder, single episode, severe without psychotic features: Secondary | ICD-10-CM | POA: Diagnosis not present

## 2021-01-12 ENCOUNTER — Other Ambulatory Visit: Payer: Self-pay

## 2021-01-12 ENCOUNTER — Ambulatory Visit (INDEPENDENT_AMBULATORY_CARE_PROVIDER_SITE_OTHER): Payer: Medicaid Other | Admitting: Nurse Practitioner

## 2021-01-12 ENCOUNTER — Encounter: Payer: Self-pay | Admitting: Nurse Practitioner

## 2021-01-12 VITALS — BP 111/81 | HR 88 | Temp 98.4°F | Ht 63.19 in | Wt 113.8 lb

## 2021-01-12 DIAGNOSIS — Z1159 Encounter for screening for other viral diseases: Secondary | ICD-10-CM

## 2021-01-12 DIAGNOSIS — Z0000 Encounter for general adult medical examination without abnormal findings: Secondary | ICD-10-CM | POA: Diagnosis not present

## 2021-01-12 DIAGNOSIS — F319 Bipolar disorder, unspecified: Secondary | ICD-10-CM | POA: Diagnosis not present

## 2021-01-12 DIAGNOSIS — R630 Anorexia: Secondary | ICD-10-CM | POA: Diagnosis not present

## 2021-01-12 DIAGNOSIS — Z23 Encounter for immunization: Secondary | ICD-10-CM

## 2021-01-12 DIAGNOSIS — M6289 Other specified disorders of muscle: Secondary | ICD-10-CM | POA: Diagnosis not present

## 2021-01-12 DIAGNOSIS — Z13220 Encounter for screening for lipoid disorders: Secondary | ICD-10-CM | POA: Diagnosis not present

## 2021-01-12 LAB — URINALYSIS, ROUTINE W REFLEX MICROSCOPIC
Bilirubin, UA: NEGATIVE
Glucose, UA: NEGATIVE
Ketones, UA: NEGATIVE
Leukocytes,UA: NEGATIVE
Nitrite, UA: NEGATIVE
Protein,UA: NEGATIVE
RBC, UA: NEGATIVE
Specific Gravity, UA: 1.02 (ref 1.005–1.030)
Urobilinogen, Ur: 0.2 mg/dL (ref 0.2–1.0)
pH, UA: 7 (ref 5.0–7.5)

## 2021-01-12 MED ORDER — METHOCARBAMOL 500 MG PO TABS: 500.0000 mg | ORAL_TABLET | Freq: Three times a day (TID) | ORAL | 0 refills | Status: DC | PRN

## 2021-01-12 NOTE — Assessment & Plan Note (Signed)
Still having intermittent pelvic floor spasms and pain. Will send in prescription for robaxin prn. She has an appointment with Dr. Valentino Saxon (GYN) next week and was encouraged to discuss with GYN for their input.

## 2021-01-12 NOTE — Patient Instructions (Signed)
Health Maintenance, Female Adopting a healthy lifestyle and getting preventive care are important in promoting health and wellness. Ask your health care provider about:  The right schedule for you to have regular tests and exams.  Things you can do on your own to prevent diseases and keep yourself healthy. What should I know about diet, weight, and exercise? Eat a healthy diet  Eat a diet that includes plenty of vegetables, fruits, low-fat dairy products, and lean protein.  Do not eat a lot of foods that are high in solid fats, added sugars, or sodium.   Maintain a healthy weight Body mass index (BMI) is used to identify weight problems. It estimates body fat based on height and weight. Your health care provider can help determine your BMI and help you achieve or maintain a healthy weight. Get regular exercise Get regular exercise. This is one of the most important things you can do for your health. Most adults should:  Exercise for at least 150 minutes each week. The exercise should increase your heart rate and make you sweat (moderate-intensity exercise).  Do strengthening exercises at least twice a week. This is in addition to the moderate-intensity exercise.  Spend less time sitting. Even light physical activity can be beneficial. Watch cholesterol and blood lipids Have your blood tested for lipids and cholesterol at 26 years of age, then have this test every 5 years. Have your cholesterol levels checked more often if:  Your lipid or cholesterol levels are high.  You are older than 26 years of age.  You are at high risk for heart disease. What should I know about cancer screening? Depending on your health history and family history, you may need to have cancer screening at various ages. This may include screening for:  Breast cancer.  Cervical cancer.  Colorectal cancer.  Skin cancer.  Lung cancer. What should I know about heart disease, diabetes, and high blood  pressure? Blood pressure and heart disease  High blood pressure causes heart disease and increases the risk of stroke. This is more likely to develop in people who have high blood pressure readings, are of African descent, or are overweight.  Have your blood pressure checked: ? Every 3-5 years if you are 18-39 years of age. ? Every year if you are 40 years old or older. Diabetes Have regular diabetes screenings. This checks your fasting blood sugar level. Have the screening done:  Once every three years after age 40 if you are at a normal weight and have a low risk for diabetes.  More often and at a younger age if you are overweight or have a high risk for diabetes. What should I know about preventing infection? Hepatitis B If you have a higher risk for hepatitis B, you should be screened for this virus. Talk with your health care provider to find out if you are at risk for hepatitis B infection. Hepatitis C Testing is recommended for:  Everyone born from 1945 through 1965.  Anyone with known risk factors for hepatitis C. Sexually transmitted infections (STIs)  Get screened for STIs, including gonorrhea and chlamydia, if: ? You are sexually active and are younger than 26 years of age. ? You are older than 26 years of age and your health care provider tells you that you are at risk for this type of infection. ? Your sexual activity has changed since you were last screened, and you are at increased risk for chlamydia or gonorrhea. Ask your health care provider   if you are at risk.  Ask your health care provider about whether you are at high risk for HIV. Your health care provider may recommend a prescription medicine to help prevent HIV infection. If you choose to take medicine to prevent HIV, you should first get tested for HIV. You should then be tested every 3 months for as long as you are taking the medicine. Pregnancy  If you are about to stop having your period (premenopausal) and  you may become pregnant, seek counseling before you get pregnant.  Take 400 to 800 micrograms (mcg) of folic acid every day if you become pregnant.  Ask for birth control (contraception) if you want to prevent pregnancy. Osteoporosis and menopause Osteoporosis is a disease in which the bones lose minerals and strength with aging. This can result in bone fractures. If you are 65 years old or older, or if you are at risk for osteoporosis and fractures, ask your health care provider if you should:  Be screened for bone loss.  Take a calcium or vitamin D supplement to lower your risk of fractures.  Be given hormone replacement therapy (HRT) to treat symptoms of menopause. Follow these instructions at home: Lifestyle  Do not use any products that contain nicotine or tobacco, such as cigarettes, e-cigarettes, and chewing tobacco. If you need help quitting, ask your health care provider.  Do not use street drugs.  Do not share needles.  Ask your health care provider for help if you need support or information about quitting drugs. Alcohol use  Do not drink alcohol if: ? Your health care provider tells you not to drink. ? You are pregnant, may be pregnant, or are planning to become pregnant.  If you drink alcohol: ? Limit how much you use to 0-1 drink a day. ? Limit intake if you are breastfeeding.  Be aware of how much alcohol is in your drink. In the [place] one drink equals one 12 oz bottle of beer (355 mL), one 5 oz glass of wine (148 mL), or one 1 oz glass of hard liquor (44 mL). General instructions  Schedule regular health, dental, and eye exams.  Stay current with your vaccines.  Tell your health care provider if: ? You often feel depressed. ? You have ever been abused or do not feel safe at home. Summary  Adopting a healthy lifestyle and getting preventive care are important in promoting health and wellness.  Follow your health care provider's instructions about healthy  diet, exercising, and getting tested or screened for diseases.  Follow your health care provider's instructions on monitoring your cholesterol and blood pressure. This information is not intended to replace advice given to you by your health care provider. Make sure you discuss any questions you have with your health care provider. Document Revised: 10/07/2018 Document Reviewed: 10/07/2018 Elsevier Patient Education  2021 [employer] Inc.  

## 2021-01-12 NOTE — Assessment & Plan Note (Signed)
Chronic, stable. PHQ 9 19 today. She feels her mood is controlled. Continue to follow with [facility].

## 2021-01-12 NOTE — Progress Notes (Signed)
BP 111/81   Pulse 88   Temp 98.4 F (36.9 C) (Oral)   Ht 5' 3.19" (1.605 m)   Wt 113 lb 12.8 oz (51.6 kg)   SpO2 98%   BMI 20.04 kg/m    Subjective:    Patient ID: Chelsea Parks, female    DOB: [birth year], 25 y.o.   MRN: [id number]   Chief Complaint  Patient presents with  . Annual Exam    Patient would like to know about pelvic floor pain when she has spasms.    HPI: Chelsea Parks is a 25 y.o. female presenting on 01/12/2021 for comprehensive medical examination. Current medical complaints include: pelvic floor spasms and pain and decreased appetite. She states that she does not get hungry however she forces herself to eat small bites along with drinking nutritional shakes.    Pelvic floor dysfunction  Saw urology who recommended PT. Went to several sessions where she did exercises, stretches, and calming techniques. She stopped going when PT said it was related to anxiety and there was nothing else they could do. She has been having intermittent spasms in her pelvic floor. The last one was on Monday and the pain lasted for 3 days. She noted a small amount of urinary incontinence with spasms. She has been taking ibuprofen with minimal relief. States that stress makes her spasms worse. She took one of husband's muscle relaxant and it helped but made her sleepy.   BIPOLAR  She sees [facility] for her bipolar. She states that they diagnosed her with PMDD. They have been adjusting her medications to help with all her symptoms. Overall, her mood is stable.   She currently lives with: husband and kids Menopausal Symptoms: no  Depression Screen done today and results listed below:  Depression screen [facility] 2/9 01/12/2021 12/20/2019  Decreased Interest 2 0  Down, Depressed, Hopeless 2 0  PHQ - 2 Score 4 0  Altered sleeping 3 0  Tired, decreased energy 3 3  Change in appetite 3 3  Feeling bad or failure about yourself  3 1  Trouble concentrating 3 0  Moving slowly  or fidgety/restless 0 0  Suicidal thoughts 0 0  PHQ-9 Score 19 7  Difficult doing work/chores Somewhat difficult Somewhat difficult    The patient does not have a history of falls. I did not complete a risk assessment for falls. A plan of care for falls was not documented.   Past Medical History:  Past Medical History:  Diagnosis Date  . Anxiety   . Bipolar disorder (HCC) 2019  . Depression   . H/O postpartum depression, currently pregnant   . Herpes genitalis in women   . Pelvic floor dysfunction in female 10/2019  . PMDD (premenstrual dysphoric disorder)   . PMDD (premenstrual dysphoric disorder)     Surgical History:  Past Surgical History:  Procedure Laterality Date  . CERVICAL BIOPSY    . no surgical history      Medications:  Current Outpatient Medications on File Prior to Visit  Medication Sig  . eszopiclone (LUNESTA) 2 MG TABS tablet Take 2 mg by mouth at bedtime as needed for sleep. Take immediately before bedtime  . chlorhexidine (PERIDEX) 0.12 % solution SMARTSIG:By Mouth  . clonazePAM (KLONOPIN) 0.5 MG tablet Take 0.5 mg by mouth daily as needed.  Marland Kitchen escitalopram (LEXAPRO) 10 MG tablet Take 10 mg by mouth daily.  . medroxyPROGESTERone (DEPO-SUBQ PROVERA 104) 104 MG/0.65ML injection Inject 0.65 mLs (104 mg  total) into the skin every 3 (three) months.  . Multiple Vitamin (MULTIVITAMIN) tablet Take by mouth.  . valACYclovir (VALTREX) 500 MG tablet Take 1 tablet (500 mg total) by mouth daily.  . [DISCONTINUED] ARIPiprazole (ABILIFY) 10 MG tablet Take 15 mg by mouth daily.    No current facility-administered medications on file prior to visit.    Allergies:  Allergies  Allergen Reactions  . Amoxicillin Diarrhea    Social History:  Social History   Socioeconomic History  . Marital status: Married    Spouse name: Not on file  . Number of children: 3  . Years of education: Not on file  . Highest education level: Not on file  Occupational History  . Not  on file  Tobacco Use  . Smoking status: Former Games developer  . Smokeless tobacco: Never Used  Vaping Use  . Vaping Use: Never used  Substance and Sexual Activity  . Alcohol use: Not Currently    Alcohol/week: 0.0 standard drinks    Comment: occasionally  . Drug use: No  . Sexual activity: Yes    Birth control/protection: None, Pill  Other Topics Concern  . Not on file  Social History Narrative  . Not on file   Social Determinants of Health   Financial Resource Strain: Not on file  Food Insecurity: Not on file  Transportation Needs: Not on file  Physical Activity: Not on file  Stress: Not on file  Social Connections: Not on file  Intimate Partner Violence: Not on file   Social History   Tobacco Use  Smoking Status Former Smoker  Smokeless Tobacco Never Used   Social History   Substance and Sexual Activity  Alcohol Use Not Currently  . Alcohol/week: 0.0 standard drinks   Comment: occasionally    Family History:  Family History  Problem Relation Age of Onset  . Hypertension Mother   . Healthy Father   . Pancreatic cancer Maternal Uncle   . Stroke Maternal Grandfather   . Cancer Maternal Grandfather   . Lupus Paternal Aunt   . GER disease Paternal Grandmother   . Kidney cancer Neg Hx     Past medical history, surgical history, medications, allergies, family history and social history reviewed with patient today and changes made to appropriate areas of the chart.   Review of Systems  Constitutional: Positive for malaise/fatigue. Negative for fever.  HENT: Negative.   Eyes: Negative.   Respiratory: Negative.   Cardiovascular: Negative.   Gastrointestinal: Positive for constipation.  Genitourinary:       Pelvic floor spasms and pain  Musculoskeletal: Negative.   Skin: Negative.   Neurological: Negative.   Endo/Heme/Allergies: Negative.   Psychiatric/Behavioral: Positive for depression (stable, medications being adjusted by psychiatry).   All other ROS  negative except what is listed above and in the HPI.      Objective:    BP 111/81   Pulse 88   Temp 98.4 F (36.9 C) (Oral)   Ht 5' 3.19" (1.605 m)   Wt 113 lb 12.8 oz (51.6 kg)   SpO2 98%   BMI 20.04 kg/m   Wt Readings from Last 3 Encounters:  01/12/21 113 lb 12.8 oz (51.6 kg)  10/18/20 126 lb 4.8 oz (57.3 kg)  10/05/20 130 lb (59 kg)    Physical Exam Vitals and nursing note reviewed.  Constitutional:      General: She is not in acute distress.    Appearance: Normal appearance.  HENT:     Head: Normocephalic  and atraumatic.     Right Ear: Tympanic membrane, ear canal and external ear normal.     Left Ear: Tympanic membrane, ear canal and external ear normal.     Nose: Nose normal.     Mouth/Throat:     Mouth: Mucous membranes are moist.     Pharynx: Oropharynx is clear.  Eyes:     Conjunctiva/sclera: Conjunctivae normal.  Cardiovascular:     Rate and Rhythm: Normal rate and regular rhythm.     Pulses: Normal pulses.     Heart sounds: Normal heart sounds.  Pulmonary:     Effort: Pulmonary effort is normal.     Breath sounds: Normal breath sounds.  Abdominal:     General: Bowel sounds are normal.     Palpations: Abdomen is soft.     Tenderness: There is no abdominal tenderness.  Musculoskeletal:        General: Normal range of motion.     Cervical back: Normal range of motion.     Comments: Strength 5/5 in bilateral upper and lower extremities  Skin:    General: Skin is warm and dry.  Neurological:     General: No focal deficit present.     Mental Status: She is alert and oriented to person, place, and time.     Cranial Nerves: No cranial nerve deficit.     Coordination: Coordination normal.     Gait: Gait normal.  Psychiatric:        Mood and Affect: Mood normal.        Behavior: Behavior normal.        Thought Content: Thought content normal.        Judgment: Judgment normal.     Results for orders placed or performed in visit on 01/12/21   Urinalysis, Routine w reflex microscopic  Result Value Ref Range   Specific Gravity, UA 1.020 1.005 - 1.030   pH, UA 7.0 5.0 - 7.5   Color, UA Yellow Yellow   Appearance Ur Hazy (A) Clear   Leukocytes,UA Negative Negative   Protein,UA Negative Negative/Trace   Glucose, UA Negative Negative   Ketones, UA Negative Negative   RBC, UA Negative Negative   Bilirubin, UA Negative Negative   Urobilinogen, Ur 0.2 0.2 - 1.0 mg/dL   Nitrite, UA Negative Negative      Assessment & Plan:   Problem List Items Addressed This Visit      Musculoskeletal and Integument   Pelvic floor dysfunction    Still having intermittent pelvic floor spasms and pain. Will send in prescription for robaxin prn. She has an appointment with Dr. Valentino Saxon (GYN) next week and was encouraged to discuss with GYN for their input.       Relevant Orders   Urinalysis, Routine w reflex microscopic (Completed)     Other   Bipolar 1 disorder (HCC)    Chronic, stable. PHQ 9 19 today. She feels her mood is controlled. Continue to follow with [facility].       Other Visit Diagnoses    Routine general medical examination at a health care facility    -  Primary   Health maintenance reviewed. Declines flu vaccine today. Check routine labs: CMP, CBC, TSH, lipid.    Relevant Orders   CBC with Differential/Platelet   Comprehensive metabolic panel   Lipid panel   TSH   Urinalysis, Routine w reflex microscopic (Completed)   Screening for cholesterol level       check  lipid panel today   Relevant Orders   Lipid panel   Encounter for hepatitis C screening test for low risk patient       screen for hepatitis C today   Relevant Orders   Hepatitis C antibody, reflex   Need for vaccination against human papillomavirus       HPV 1 shot given today   Relevant Orders   HPV 9-valent vaccine,Recombinat (Completed)   Decreased appetite       Decreased appetite. BMI 20. Discussed eating small frequent meals and  supplementing with nutritional drinks. Will consult dietician per pt request   Relevant Orders   Amb ref to Medical Nutrition Therapy-[facility]       Follow up plan: Return in about 1 year (around 01/12/2022) for physical.   LABORATORY TESTING:  - Pap smear: up to date  IMMUNIZATIONS:   - Tdap: Tetanus vaccination status reviewed: last tetanus booster within 10 years. - Influenza: Refused - Pneumovax: Not applicable - Prevnar: Not applicable - HPV: Administered today - Zostavax vaccine: Not applicable  SCREENING: -Mammogram: Not applicable  - Colonoscopy: Not applicable  - Bone Density: Not applicable  -Hearing Test: Not applicable  -Spirometry: Not applicable   PATIENT COUNSELING:   Advised to take 1 mg of folate supplement per day if capable of pregnancy.   Sexuality: Discussed sexually transmitted diseases, partner selection, use of condoms, avoidance of unintended pregnancy  and contraceptive alternatives.   Advised to avoid cigarette smoking.  I discussed with the patient that most people either abstain from alcohol or drink within safe limits (<=14/week and <=4 drinks/occasion for males, <=7/weeks and <= 3 drinks/occasion for females) and that the risk for alcohol disorders and other health effects rises proportionally with the number of drinks per week and how often a drinker exceeds daily limits.  Discussed cessation/primary prevention of drug use and availability of treatment for abuse.   Diet: Encouraged to adjust caloric intake to maintain  or achieve ideal body weight, to reduce intake of dietary saturated fat and total fat, to limit sodium intake by avoiding high sodium foods and not adding table salt, and to maintain adequate dietary potassium and calcium preferably from fresh fruits, vegetables, and low-fat dairy products.    stressed the importance of regular exercise  Injury prevention: Discussed safety belts, safety helmets, smoke detector, smoking near bedding  or upholstery.   Dental health: Discussed importance of regular tooth brushing, flossing, and dental visits.    NEXT PREVENTATIVE PHYSICAL DUE IN 1 YEAR. Return in about 1 year (around 01/12/2022) for physical.

## 2021-01-13 LAB — COMPREHENSIVE METABOLIC PANEL
ALT: 6 IU/L (ref 0–32)
AST: 17 IU/L (ref 0–40)
Albumin/Globulin Ratio: 2.3 — ABNORMAL HIGH (ref 1.2–2.2)
Albumin: 5.4 g/dL — ABNORMAL HIGH (ref 3.9–5.0)
Alkaline Phosphatase: 88 IU/L (ref 44–121)
BUN/Creatinine Ratio: 8 — ABNORMAL LOW (ref 9–23)
BUN: 7 mg/dL (ref 6–20)
Bilirubin Total: 0.3 mg/dL (ref 0.0–1.2)
CO2: 20 mmol/L (ref 20–29)
Calcium: 10.1 mg/dL (ref 8.7–10.2)
Chloride: 100 mmol/L (ref 96–106)
Creatinine, Ser: 0.84 mg/dL (ref 0.57–1.00)
Globulin, Total: 2.3 g/dL (ref 1.5–4.5)
Glucose: 84 mg/dL (ref 65–99)
Potassium: 3.7 mmol/L (ref 3.5–5.2)
Sodium: 139 mmol/L (ref 134–144)
Total Protein: 7.7 g/dL (ref 6.0–8.5)
eGFR: 99 mL/min/{1.73_m2} (ref >59)

## 2021-01-13 LAB — CBC WITH DIFFERENTIAL/PLATELET
Basophils Absolute: 0.1 10*3/uL (ref 0.0–0.2)
Basos: 1 %
EOS (ABSOLUTE): 0.1 10*3/uL (ref 0.0–0.4)
Eos: 1 %
Hematocrit: 40.9 % (ref 34.0–46.6)
Hemoglobin: 14.3 g/dL (ref 11.1–15.9)
Immature Grans (Abs): 0 10*3/uL (ref 0.0–0.1)
Immature Granulocytes: 0 %
Lymphocytes Absolute: 2.7 10*3/uL (ref 0.7–3.1)
Lymphs: 29 %
MCH: 32.4 pg (ref 26.6–33.0)
MCHC: 35 g/dL (ref 31.5–35.7)
MCV: 93 fL (ref 79–97)
Monocytes Absolute: 0.5 10*3/uL (ref 0.1–0.9)
Monocytes: 5 %
Neutrophils Absolute: 6 10*3/uL (ref 1.4–7.0)
Neutrophils: 64 %
Platelets: 303 10*3/uL (ref 150–450)
RBC: 4.42 x10E6/uL (ref 3.77–5.28)
RDW: 12.3 % (ref 11.7–15.4)
WBC: 9.4 10*3/uL (ref 3.4–10.8)

## 2021-01-13 LAB — TSH: TSH: 1.08 u[IU]/mL (ref 0.450–4.500)

## 2021-01-13 LAB — LIPID PANEL
Chol/HDL Ratio: 3.6 ratio (ref 0.0–4.4)
Cholesterol, Total: 164 mg/dL (ref 100–199)
HDL: 45 mg/dL (ref >39)
LDL Chol Calc (NIH): 107 mg/dL — ABNORMAL HIGH (ref 0–99)
Triglycerides: 63 mg/dL (ref 0–149)
VLDL Cholesterol Cal: 12 mg/dL (ref 5–40)

## 2021-01-15 DIAGNOSIS — F419 Anxiety disorder, unspecified: Secondary | ICD-10-CM | POA: Diagnosis not present

## 2021-01-16 ENCOUNTER — Other Ambulatory Visit: Payer: Self-pay

## 2021-01-16 ENCOUNTER — Ambulatory Visit (INDEPENDENT_AMBULATORY_CARE_PROVIDER_SITE_OTHER): Payer: Medicaid Other | Admitting: Obstetrics and Gynecology

## 2021-01-16 ENCOUNTER — Encounter: Payer: Self-pay | Admitting: Obstetrics and Gynecology

## 2021-01-16 VITALS — BP 117/83 | HR 116 | Ht 63.19 in | Wt 118.2 lb

## 2021-01-16 DIAGNOSIS — Z3042 Encounter for surveillance of injectable contraceptive: Secondary | ICD-10-CM | POA: Diagnosis not present

## 2021-01-16 DIAGNOSIS — F39 Unspecified mood [affective] disorder: Secondary | ICD-10-CM

## 2021-01-16 NOTE — Progress Notes (Signed)
Pt present for follow up on birth control. Pt is currently taking the depo provera for birth control. Pt denies any side effects or issues with the medication.

## 2021-01-16 NOTE — Progress Notes (Signed)
° ° °  GYNECOLOGY PROGRESS NOTE  Subjective:    Patient ID: Chelsea Parks, female    DOB: 04/02/1996, 25 y.o.   MRN: [id number]    Patient is a 25 y.o. [id number] female who presents for f/u of contraception. She was initiated on Depo Provera (self-injecting) 3 months ago. She denies any concerning side effects. Has no problems administering injections at home. She reports that since that time, her PCP informed her that she actually did not have bipolar disorder as initially diagnosed.  Reports that they are thinking more along the lines of PMDD.     The following portions of the patient's history were reviewed and updated as appropriate: allergies, current medications, past family history, past medical history, past social history, past surgical history and problem list.  Review of Systems Pertinent items noted in HPI and remainder of comprehensive ROS otherwise negative.   Objective:   Blood pressure 117/83, pulse (!) 116, height 5' 3.19" (1.605 m), weight 118 lb 3.2 oz (53.6 kg), not currently breastfeeding. General appearance: alert and no distress Remainder of exam deferred.    Assessment:   1. Encounter for surveillance of injectable contraceptive   2. Mood disorder (HCC)     Plan:   1. Doing well with self-injectable Depo Provera.  Can continue.  2. Mood disorder - unclear if symptoms are PMDD as she cannot pinpoint if they are always related to her menstrual cycle. Diagnosis of bipolar disorder removed. Advised that if symptoms truly are related to her menses and does have PMDD, the Provera injections will hopefully lead to amenorrhea and help with symptoms;   Return to clinic for any scheduled appointments or for any gynecologic concerns as needed.    Hildred Laser, MD [facility]

## 2021-01-18 ENCOUNTER — Encounter: Payer: Self-pay | Admitting: Obstetrics and Gynecology

## 2021-01-23 DIAGNOSIS — F322 Major depressive disorder, single episode, severe without psychotic features: Secondary | ICD-10-CM | POA: Diagnosis not present

## 2021-01-24 DIAGNOSIS — F322 Major depressive disorder, single episode, severe without psychotic features: Secondary | ICD-10-CM | POA: Diagnosis not present

## 2021-01-29 ENCOUNTER — Other Ambulatory Visit: Payer: Self-pay

## 2021-01-29 ENCOUNTER — Ambulatory Visit (LOCAL_COMMUNITY_HEALTH_CENTER): Payer: Medicaid Other

## 2021-01-29 DIAGNOSIS — Z111 Encounter for screening for respiratory tuberculosis: Secondary | ICD-10-CM

## 2021-01-31 ENCOUNTER — Telehealth: Payer: Self-pay | Admitting: *Deleted

## 2021-01-31 DIAGNOSIS — F322 Major depressive disorder, single episode, severe without psychotic features: Secondary | ICD-10-CM | POA: Diagnosis not present

## 2021-01-31 NOTE — Telephone Encounter (Signed)
Placed in your folder for signature seen you for a cpe recently

## 2021-01-31 NOTE — Progress Notes (Deleted)
02/01/2021 10:09 AM   Chelsea Parks 1995-11-19 [id number]  Referring provider: Marjie Skiff, NP [address] [place],  [place] [address]  No chief complaint on file.  Urological history: 1. Pelvic floor dysfunction -manage with pelvic floor physical therapy - last session 10/2020 -CT and MRI at [facility] for tailbone injury 2021 were NED -was referred to [facility] - patient "No showed"  2. rUTI's -risk factors of sexually active -documented positive urine cultures over the last year  Pansensitive E. coli in August 2021  Pansensitive E. coli in November 2021  HPI:   PMH: Past Medical History:  Diagnosis Date  . Anxiety   . Bipolar disorder (HCC) 2019  . Depression   . H/O postpartum depression, currently pregnant   . Herpes genitalis in women   . High cholesterol   . Pelvic floor dysfunction in female 10/2019  . PMDD (premenstrual dysphoric disorder)   . PMDD (premenstrual dysphoric disorder)     Surgical History: Past Surgical History:  Procedure Laterality Date  . CERVICAL BIOPSY    . no surgical history      Home Medications:  Allergies as of 02/01/2021      Reactions   Amoxicillin Diarrhea      Medication List       Accurate as of January 31, 2021 10:09 AM. If you have any questions, ask your nurse or doctor.        clonazePAM 0.5 MG tablet Commonly known as: KLONOPIN Take 0.5 mg by mouth daily as needed.   Depo-SubQ Provera 104 104 MG/0.65ML injection Generic drug: medroxyPROGESTERone Inject 0.65 mLs (104 mg total) into the skin every 3 (three) months.   escitalopram 10 MG tablet Commonly known as: LEXAPRO Take 10 mg by mouth daily.   eszopiclone 2 MG Tabs tablet Commonly known as: LUNESTA Take 2 mg by mouth at bedtime as needed for sleep. Take immediately before bedtime   FLUoxetine 10 MG tablet Commonly known as: PROZAC Take 10 mg by mouth daily.   methocarbamol 500 MG tablet Commonly known as: Robaxin Take 1 tablet (500  mg total) by mouth every 8 (eight) hours as needed for muscle spasms.   multivitamin tablet Take by mouth.   valACYclovir 500 MG tablet Commonly known as: Valtrex Take 1 tablet (500 mg total) by mouth daily.       Allergies:  Allergies  Allergen Reactions  . Amoxicillin Diarrhea    Family History: Family History  Problem Relation Age of Onset  . Hypertension Mother   . Hyperlipidemia Mother   . Healthy Father   . Pancreatic cancer Maternal Uncle   . Stroke Maternal Grandfather   . Cancer Maternal Grandfather   . Lupus Paternal Aunt   . GER disease Paternal Grandmother   . Kidney cancer Neg Hx     Social History:  reports that she has quit smoking. She has never used smokeless tobacco. She reports previous alcohol use. She reports that she does not use drugs.  ROS: Pertinent ROS in HPI  Physical Exam: There were no vitals taken for this visit.  Constitutional:  Well nourished. Alert and oriented, No acute distress. HEENT: [place] AT, moist mucus membranes.  Trachea midline, no masses. Cardiovascular: No clubbing, cyanosis, or edema. Respiratory: Normal respiratory effort, no increased work of breathing. GI: Abdomen is soft, non tender, non distended, no abdominal masses. Liver and spleen not palpable.  No hernias appreciated.  Stool sample for occult testing is not  indicated.   GU: No CVA tenderness.  No bladder fullness or masses.  *** external genitalia, *** pubic hair distribution, no lesions.  Normal urethral meatus, no lesions, no prolapse, no discharge.   No urethral masses, tenderness and/or tenderness. No bladder fullness, tenderness or masses. *** vagina mucosa, *** estrogen effect, no discharge, no lesions, *** pelvic support, *** cystocele and *** rectocele noted.  No cervical motion tenderness.  Uterus is freely mobile and non-fixed.  No adnexal/parametria masses or tenderness noted.  Anus and perineum are without rashes or lesions.   ***  Skin: No rashes, bruises  or suspicious lesions. Lymph: No cervical or inguinal adenopathy. Neurologic: Grossly intact, no focal deficits, moving all 4 extremities. Psychiatric: Normal mood and affect.   Laboratory Data: Lab Results  Component Value Date   WBC 9.4 01/12/2021   HGB 14.3 01/12/2021   HCT 40.9 01/12/2021   MCV 93 01/12/2021   PLT 303 01/12/2021   Lab Results  Component Value Date   CREATININE 0.84 01/12/2021   Lab Results  Component Value Date   TSH 1.080 01/12/2021   Lab Results  Component Value Date   AST 17 01/12/2021   Lab Results  Component Value Date   ALT 6 01/12/2021    Urinalysis ***  I have reviewed the labs.  Pertinent Imaging: ***   Assessment & Plan:   1. Bladder spasms - Urinalysis, Complete - benign  - CULTURE, URINE COMPREHENSIVE - patient is still with symptoms - we discussed speaking with her gynecologist about changing her BC pills to ones that allow for less frequent menstrual cycles - she will be starting her pelvic PT soon - she has an upcoming appointment with the [facility] for Vulvar and Urogenital pain  2.  Dyspareunia - see above  No follow-ups on file.  These notes generated with voice recognition software. I apologize for typographical errors.  Michiel Cowboy, PA-C  [facility] [address]  [address] [place], [place] [address] [phone]

## 2021-01-31 NOTE — Telephone Encounter (Signed)
PT. CAME IN TO DROP OFF HEALTH ASSESMENT FORM TO BE FILLED OUT BY [employer] CANNADY. PLACED IN BIN FOR REVIEW

## 2021-02-01 ENCOUNTER — Ambulatory Visit: Payer: Medicaid Other | Admitting: Urology

## 2021-02-01 ENCOUNTER — Encounter: Payer: Self-pay | Admitting: Urology

## 2021-02-01 ENCOUNTER — Other Ambulatory Visit: Payer: Self-pay

## 2021-02-01 ENCOUNTER — Ambulatory Visit (LOCAL_COMMUNITY_HEALTH_CENTER): Payer: Medicaid Other

## 2021-02-01 DIAGNOSIS — N390 Urinary tract infection, site not specified: Secondary | ICD-10-CM

## 2021-02-01 DIAGNOSIS — Z111 Encounter for screening for respiratory tuberculosis: Secondary | ICD-10-CM

## 2021-02-01 DIAGNOSIS — M6289 Other specified disorders of muscle: Secondary | ICD-10-CM

## 2021-02-01 LAB — TB SKIN TEST
Induration: 0 mm
TB Skin Test: NEGATIVE

## 2021-02-05 ENCOUNTER — Telehealth: Payer: Self-pay

## 2021-02-05 NOTE — Telephone Encounter (Signed)
Paperwork picked up.

## 2021-02-05 NOTE — Telephone Encounter (Signed)
Copied from CRM [id number]. Topic: General - Inquiry >> Feb 05, 2021  2:14 PM Daphine Deutscher D wrote: Pt called saying she lost the daycare form that she just had completed and wants to know if the office scanned it in her chart and can get a copy   [phone]

## 2021-02-07 DIAGNOSIS — F322 Major depressive disorder, single episode, severe without psychotic features: Secondary | ICD-10-CM | POA: Diagnosis not present

## 2021-02-08 DIAGNOSIS — F322 Major depressive disorder, single episode, severe without psychotic features: Secondary | ICD-10-CM | POA: Diagnosis not present

## 2021-02-14 DIAGNOSIS — F419 Anxiety disorder, unspecified: Secondary | ICD-10-CM | POA: Diagnosis not present

## 2021-03-01 DIAGNOSIS — F322 Major depressive disorder, single episode, severe without psychotic features: Secondary | ICD-10-CM | POA: Diagnosis not present

## 2021-03-02 DIAGNOSIS — F322 Major depressive disorder, single episode, severe without psychotic features: Secondary | ICD-10-CM | POA: Diagnosis not present

## 2021-03-06 DIAGNOSIS — F419 Anxiety disorder, unspecified: Secondary | ICD-10-CM | POA: Diagnosis not present

## 2021-03-16 ENCOUNTER — Ambulatory Visit: Payer: Medicaid Other | Admitting: Dietician

## 2021-04-02 DIAGNOSIS — F322 Major depressive disorder, single episode, severe without psychotic features: Secondary | ICD-10-CM | POA: Diagnosis not present

## 2021-04-05 DIAGNOSIS — F419 Anxiety disorder, unspecified: Secondary | ICD-10-CM | POA: Diagnosis not present

## 2021-04-10 DIAGNOSIS — F322 Major depressive disorder, single episode, severe without psychotic features: Secondary | ICD-10-CM | POA: Diagnosis not present

## 2021-04-11 DIAGNOSIS — F419 Anxiety disorder, unspecified: Secondary | ICD-10-CM | POA: Diagnosis not present

## 2021-04-18 DIAGNOSIS — F419 Anxiety disorder, unspecified: Secondary | ICD-10-CM | POA: Diagnosis not present

## 2021-04-19 DIAGNOSIS — F322 Major depressive disorder, single episode, severe without psychotic features: Secondary | ICD-10-CM | POA: Diagnosis not present

## 2021-05-01 DIAGNOSIS — F322 Major depressive disorder, single episode, severe without psychotic features: Secondary | ICD-10-CM | POA: Diagnosis not present

## 2021-05-03 DIAGNOSIS — F322 Major depressive disorder, single episode, severe without psychotic features: Secondary | ICD-10-CM | POA: Diagnosis not present

## 2021-05-15 DIAGNOSIS — F322 Major depressive disorder, single episode, severe without psychotic features: Secondary | ICD-10-CM | POA: Diagnosis not present

## 2021-05-16 NOTE — Progress Notes (Signed)
Entered in error

## 2021-05-16 NOTE — Patient Instructions (Signed)
Breast Self-Awareness Breast self-awareness means being familiar with how your breasts look and feel. It involves checking your breasts regularly and reporting any changes to yourhealth care provider. Practicing breast self-awareness is important. Sometimes changes may not be harmful (are benign), but sometimes a change in your breasts can be a sign of a serious medical problem. It is important to learn how to do this procedure correctly so that you can catch problems early, when treatment is more likely to be successful. All women should practice breast self-awareness, including women who have hadbreast implants. What you need: A mirror. A well-lit room. How to do a breast self-exam A breast self-exam is one way to learn what is normal for your breasts andwhether your breasts are changing. To do a breast self-exam: Look for changes  Remove all the clothing above your waist. Stand in front of a mirror in a room with good lighting. Put your hands on your hips. Push your hands firmly downward. Compare your breasts in the mirror. Look for differences between them (asymmetry), such as: Differences in shape. Differences in size. Puckers, dips, and bumps in one breast and not the other. Look at each breast for changes in the skin, such as: Redness. Scaly areas. Look for changes in your nipples, such as: Discharge. Bleeding. Dimpling. Redness. A change in position.  Feel for changes Carefully feel your breasts for lumps and changes. It is best to do this while lying on your back on the floor, and again while sitting or standing in the tub or shower with soapy water on your skin. Feel each breast in the following way: Place the arm on the side of the breast you are examining above your head. Feel your breast with the other hand. Start in the nipple area and make -inch (2 cm) overlapping circles to feel your breast. Use the pads of your three middle fingers to do this. Apply light pressure,  then medium pressure, then firm pressure. The light pressure will allow you to feel the tissue closest to the skin. The medium pressure will allow you to feel the tissue that is a little deeper. The firm pressure will allow you to feel the tissue close to the ribs. Continue the overlapping circles, moving downward over the breast until you feel your ribs below your breast. Move one finger-width toward the center of the body. Continue to use the -inch (2 cm) overlapping circles to feel your breast as you move slowly up toward your collarbone. Continue the up-and-down exam using all three pressures until you reach your armpit.  Write down what you find Writing down what you find can help you remember what to discuss with your health care provider. Write down: What is normal for each breast. Any changes that you find in each breast, including: The kind of changes you find. Any pain or tenderness. Size and location of any lumps. Where you are in your menstrual cycle, if you are still menstruating. General tips and recommendations Examine your breasts every month. If you are breastfeeding, the best time to examine your breasts is after a feeding or after using a breast pump. If you menstruate, the best time to examine your breasts is 5-7 days after your period. Breasts are generally lumpier during menstrual periods, and it may be more difficult to notice changes. With time and practice, you will become more familiar with the variations in your breasts and more comfortable with the exam. Contact a health care provider if you: See a  change in the shape or size of your breasts or nipples. See a change in the skin of your breast or nipples, such as a reddened or scaly area. Have unusual discharge from your nipples. Find a lump or thick area that was not there before. Have pain in your breasts. Have any concerns related to your breast health. Summary Breast self-awareness includes looking for  physical changes in your breasts, as well as feeling for any changes within your breasts. Breast self-awareness should be performed in front of a mirror in a well-lit room. You should examine your breasts every month. If you menstruate, the best time to examine your breasts is 5-7 days after your menstrual period. Let your health care provider know of any changes you notice in your breasts, including changes in size, changes on the skin, pain or tenderness, or unusual fluid from your nipples. This information is not intended to replace advice given to you by your health care provider. Make sure you discuss any questions you have with your healthcare provider. Document Revised: 06/02/2018 Document Reviewed: 06/02/2018 Elsevier Patient Education  [place] 71-50 Years Old, Female Preventive care refers to lifestyle choices and visits with your health care provider that can promote health and wellness. This includes: A yearly physical exam. This is also called an annual wellness visit. Regular dental and eye exams. Immunizations. Screening for certain conditions. Healthy lifestyle choices, such as: Eating a healthy diet. Getting regular exercise. Not using drugs or products that contain nicotine and tobacco. Limiting alcohol use. What can I expect for my preventive care visit? Physical exam Your health care provider may check your: Height and weight. These may be used to calculate your BMI (body mass index). BMI is a measurement that tells if you are at a healthy weight. Heart rate and blood pressure. Body temperature. Skin for abnormal spots. Counseling Your health care provider may ask you questions about your: Past medical problems. Family's medical history. Alcohol, tobacco, and drug use. Emotional well-being. Home life and relationship well-being. Sexual activity. Diet, exercise, and sleep habits. Work and work Statistician. Access to firearms. Method of  birth control. Menstrual cycle. Pregnancy history. What immunizations do I need?  Vaccines are usually given at various ages, according to a schedule. Your health care provider will recommend vaccines for you based on your age, medicalhistory, and lifestyle or other factors, such as travel or where you work. What tests do I need?  Blood tests Lipid and cholesterol levels. These may be checked every 5 years starting at age 65. Hepatitis C test. Hepatitis B test. Screening Diabetes screening. This is done by checking your blood sugar (glucose) after you have not eaten for a while (fasting). STD (sexually transmitted disease) testing, if you are at risk. BRCA-related cancer screening. This may be done if you have a family history of breast, ovarian, tubal, or peritoneal cancers. Pelvic exam and Pap test. This may be done every 3 years starting at age 36. Starting at age 38, this may be done every 5 years if you have a Pap test in combination with an HPV test. Talk with your health care provider about your test results, treatment options,and if necessary, the need for more tests. Follow these instructions at home: Eating and drinking  Eat a healthy diet that includes fresh fruits and vegetables, whole grains, lean protein, and low-fat dairy products. Take vitamin and mineral supplements as recommended by your health care provider. Do not drink alcohol if: Your health  care provider tells you not to drink. You are pregnant, may be pregnant, or are planning to become pregnant. If you drink alcohol: Limit how much you have to 0-1 drink a day. Be aware of how much alcohol is in your drink. In the [place] one drink equals one 12 oz bottle of beer (355 mL), one 5 oz glass of wine (148 mL), or one 1 oz glass of hard liquor (44 mL).  Lifestyle Take daily care of your teeth and gums. Brush your teeth every morning and night with fluoride toothpaste. Floss one time each day. Stay active. Exercise for  at least 30 minutes 5 or more days each week. Do not use any products that contain nicotine or tobacco, such as cigarettes, e-cigarettes, and chewing tobacco. If you need help quitting, ask your health care provider. Do not use drugs. If you are sexually active, practice safe sex. Use a condom or other form of protection to prevent STIs (sexually transmitted infections). If you do not wish to become pregnant, use a form of birth control. If you plan to become pregnant, see your health care provider for a prepregnancy visit. Find healthy ways to cope with stress, such as: Meditation, yoga, or listening to music. Journaling. Talking to a trusted person. Spending time with friends and family. Safety Always wear your seat belt while driving or riding in a vehicle. Do not drive: If you have been drinking alcohol. Do not ride with someone who has been drinking. When you are tired or distracted. While texting. Wear a helmet and other protective equipment during sports activities. If you have firearms in your house, make sure you follow all gun safety procedures. Seek help if you have been physically or sexually abused. What's next? Go to your health care provider once a year for an annual wellness visit. Ask your health care provider how often you should have your eyes and teeth checked. Stay up to date on all vaccines. This information is not intended to replace advice given to you by your health care provider. Make sure you discuss any questions you have with your healthcare provider. Document Revised: 06/11/2020 Document Reviewed: 06/25/2018 Elsevier Patient Education  2022 [employer].

## 2021-05-17 ENCOUNTER — Telehealth: Payer: Self-pay

## 2021-05-17 ENCOUNTER — Encounter: Payer: Medicaid Other | Admitting: Obstetrics and Gynecology
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
## 2021-05-18 ENCOUNTER — Encounter: Payer: Medicaid Other | Admitting: Obstetrics and Gynecology

## 2021-05-18 DIAGNOSIS — F322 Major depressive disorder, single episode, severe without psychotic features: Secondary | ICD-10-CM | POA: Diagnosis not present

## 2021-05-25 DIAGNOSIS — F419 Anxiety disorder, unspecified: Secondary | ICD-10-CM | POA: Diagnosis not present

## 2021-05-31 DIAGNOSIS — F419 Anxiety disorder, unspecified: Secondary | ICD-10-CM | POA: Diagnosis not present

## 2021-06-01 DIAGNOSIS — F322 Major depressive disorder, single episode, severe without psychotic features: Secondary | ICD-10-CM | POA: Diagnosis not present

## 2021-06-14 DIAGNOSIS — F419 Anxiety disorder, unspecified: Secondary | ICD-10-CM | POA: Diagnosis not present

## 2021-06-15 DIAGNOSIS — F419 Anxiety disorder, unspecified: Secondary | ICD-10-CM | POA: Diagnosis not present

## 2021-06-27 DIAGNOSIS — F322 Major depressive disorder, single episode, severe without psychotic features: Secondary | ICD-10-CM | POA: Diagnosis not present

## 2021-07-16 ENCOUNTER — Ambulatory Visit (INDEPENDENT_AMBULATORY_CARE_PROVIDER_SITE_OTHER): Payer: Medicaid Other

## 2021-07-16 ENCOUNTER — Other Ambulatory Visit: Payer: Self-pay

## 2021-07-16 DIAGNOSIS — Z23 Encounter for immunization: Secondary | ICD-10-CM | POA: Diagnosis not present

## 2021-07-17 DIAGNOSIS — F322 Major depressive disorder, single episode, severe without psychotic features: Secondary | ICD-10-CM | POA: Diagnosis not present
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
## 2021-07-18 ENCOUNTER — Ambulatory Visit (INDEPENDENT_AMBULATORY_CARE_PROVIDER_SITE_OTHER): Payer: Medicaid Other | Admitting: Obstetrics and Gynecology

## 2021-07-18 ENCOUNTER — Encounter: Payer: Self-pay | Admitting: Obstetrics and Gynecology

## 2021-07-18 ENCOUNTER — Other Ambulatory Visit: Payer: Self-pay

## 2021-07-18 ENCOUNTER — Other Ambulatory Visit (HOSPITAL_COMMUNITY)
Admission: RE | Admit: 2021-07-18 | Discharge: 2021-07-18 | Disposition: A | Discharge status: Home / Self Care | Payer: Medicaid Other | Source: Ambulatory Visit | Attending: Obstetrics and Gynecology | Admitting: Obstetrics and Gynecology

## 2021-07-18 VITALS — BP 100/69 | HR 101 | Resp 16 | Ht 63.0 in | Wt 118.6 lb

## 2021-07-18 DIAGNOSIS — Z01419 Encounter for gynecological examination (general) (routine) without abnormal findings: Secondary | ICD-10-CM | POA: Diagnosis not present

## 2021-07-18 DIAGNOSIS — Z124 Encounter for screening for malignant neoplasm of cervix: Secondary | ICD-10-CM | POA: Diagnosis not present

## 2021-07-18 DIAGNOSIS — F319 Bipolar disorder, unspecified: Secondary | ICD-10-CM | POA: Diagnosis not present

## 2021-07-18 DIAGNOSIS — F3281 Premenstrual dysphoric disorder: Secondary | ICD-10-CM

## 2021-07-18 DIAGNOSIS — Z3042 Encounter for surveillance of injectable contraceptive: Secondary | ICD-10-CM | POA: Diagnosis not present

## 2021-07-18 MED ORDER — DEPO-SUBQ PROVERA 104 104 MG/0.65ML ~~LOC~~ SUSY: 104.0000 mg | PREFILLED_SYRINGE | SUBCUTANEOUS | 3 refills | Status: DC

## 2021-07-18 NOTE — Patient Instructions (Addendum)
Preventive Care 21-26 Years Old, Female Preventive care refers to lifestyle choices and visits with your health care provider that can promote health and wellness. This includes: A yearly physical exam. This is also called an annual wellness visit. Regular dental and eye exams. Immunizations. Screening for certain conditions. Healthy lifestyle choices, such as: Eating a healthy diet. Getting regular exercise. Not using drugs or products that contain nicotine and tobacco. Limiting alcohol use. What can I expect for my preventive care visit? Physical exam Your health care provider may check your: Height and weight. These may be used to calculate your BMI (body mass index). BMI is a measurement that tells if you are at a healthy weight. Heart rate and blood pressure. Body temperature. Skin for abnormal spots. Counseling Your health care provider may ask you questions about your: Past medical problems. Family's medical history. Alcohol, tobacco, and drug use. Emotional well-being. Home life and relationship well-being. Sexual activity. Diet, exercise, and sleep habits. Work and work environment. Access to firearms. Method of birth control. Menstrual cycle. Pregnancy history. What immunizations do I need? Vaccines are usually given at various ages, according to a schedule. Your health care provider will recommend vaccines for you based on your age, medical history, and lifestyle or other factors, such as travel or where you work. What tests do I need? Blood tests Lipid and cholesterol levels. These may be checked every 5 years starting at age 20. Hepatitis C test. Hepatitis B test. Screening Diabetes screening. This is done by checking your blood sugar (glucose) after you have not eaten for a while (fasting). STD (sexually transmitted disease) testing, if you are at risk. BRCA-related cancer screening. This may be done if you have a family history of breast, ovarian, tubal, or  peritoneal cancers. Pelvic exam and Pap test. This may be done every 3 years starting at age 26. Starting at age 30, this may be done every 5 years if you have a Pap test in combination with an HPV test. Talk with your health care provider about your test results, treatment options, and if necessary, the need for more tests. Follow these instructions at home: Eating and drinking  Eat a healthy diet that includes fresh fruits and vegetables, whole grains, lean protein, and low-fat dairy products. Take vitamin and mineral supplements as recommended by your health care provider. Do not drink alcohol if: Your health care provider tells you not to drink. You are pregnant, may be pregnant, or are planning to become pregnant. If you drink alcohol: Limit how much you have to 0-1 drink a day. Be aware of how much alcohol is in your drink. In the [place] one drink equals one 12 oz bottle of beer (355 mL), one 5 oz glass of wine (148 mL), or one 1 oz glass of hard liquor (44 mL). Lifestyle Take daily care of your teeth and gums. Brush your teeth every morning and night with fluoride toothpaste. Floss one time each day. Stay active. Exercise for at least 30 minutes 5 or more days each week. Do not use any products that contain nicotine or tobacco, such as cigarettes, e-cigarettes, and chewing tobacco. If you need help quitting, ask your health care provider. Do not use drugs. If you are sexually active, practice safe sex. Use a condom or other form of protection to prevent STIs (sexually transmitted infections). If you do not wish to become pregnant, use a form of birth control. If you plan to become pregnant, see your health care provider   for a prepregnancy visit. Find healthy ways to cope with stress, such as: Meditation, yoga, or listening to music. Journaling. Talking to a trusted person. Spending time with friends and family. Safety Always wear your seat belt while driving or riding in a  vehicle. Do not drive: If you have been drinking alcohol. Do not ride with someone who has been drinking. When you are tired or distracted. While texting. Wear a helmet and other protective equipment during sports activities. If you have firearms in your house, make sure you follow all gun safety procedures. Seek help if you have been physically or sexually abused. What's next? Go to your health care provider once a year for an annual wellness visit. Ask your health care provider how often you should have your eyes and teeth checked. Stay up to date on all vaccines. This information is not intended to replace advice given to you by your health care provider. Make sure you discuss any questions you have with your health care provider. Document Revised: 12/22/2020 Document Reviewed: 06/25/2018 Elsevier Patient Education  2022 [employer] Inc.  

## 2021-07-19 ENCOUNTER — Telehealth: Payer: Self-pay

## 2021-07-19 DIAGNOSIS — F419 Anxiety disorder, unspecified: Secondary | ICD-10-CM | POA: Diagnosis not present

## 2021-07-19 LAB — CYTOLOGY - PAP: Diagnosis: NEGATIVE

## 2021-07-19 NOTE — Telephone Encounter (Signed)
Pharmacist called stating pts medicaid will not pay for Sub Q depo.   Reviewed [facility] note could not see a reason for subq. Pharmacist aware to change to IM.

## 2021-07-23 ENCOUNTER — Telehealth: Payer: Self-pay | Admitting: Obstetrics and Gynecology

## 2021-07-23 NOTE — Telephone Encounter (Signed)
Called Chelsea Parks at [facility] she wanted to update [name] about [name] medication. She thought that her medication was not getting approved because it was Sub Q, so she requested to have it changed to IM. Medication was not covered because it was to soon for her to get medication again. [name] wanted to change order back to the way it was originally. I told her that was ok.

## 2021-07-23 NOTE — Telephone Encounter (Signed)
Chelsea Parks with [facility] called about depo RX- she states that she spoke with [name] before but needs approval to change RX from IM to subcu. Please Advise. Her call back is [phone]

## 2021-08-02 DIAGNOSIS — F322 Major depressive disorder, single episode, severe without psychotic features: Secondary | ICD-10-CM | POA: Diagnosis not present

## 2021-08-13 DIAGNOSIS — F419 Anxiety disorder, unspecified: Secondary | ICD-10-CM | POA: Diagnosis not present

## 2021-08-14 ENCOUNTER — Ambulatory Visit: Payer: Medicaid Other | Admitting: Nurse Practitioner

## 2021-08-15 ENCOUNTER — Ambulatory Visit: Payer: Medicaid Other | Admitting: Nurse Practitioner

## 2021-08-16 ENCOUNTER — Encounter: Payer: Self-pay | Admitting: Family Medicine

## 2021-08-16 ENCOUNTER — Ambulatory Visit (INDEPENDENT_AMBULATORY_CARE_PROVIDER_SITE_OTHER): Payer: Medicaid Other | Admitting: Family Medicine

## 2021-08-16 ENCOUNTER — Other Ambulatory Visit: Payer: Self-pay

## 2021-08-16 VITALS — BP 116/73 | HR 91 | Ht 63.0 in | Wt 120.0 lb

## 2021-08-16 DIAGNOSIS — Z021 Encounter for pre-employment examination: Secondary | ICD-10-CM

## 2021-08-16 DIAGNOSIS — F322 Major depressive disorder, single episode, severe without psychotic features: Secondary | ICD-10-CM | POA: Diagnosis not present

## 2021-08-16 DIAGNOSIS — Z111 Encounter for screening for respiratory tuberculosis: Secondary | ICD-10-CM

## 2021-08-16 LAB — URINALYSIS, ROUTINE W REFLEX MICROSCOPIC
Bilirubin, UA: NEGATIVE
Glucose, UA: NEGATIVE
Ketones, UA: NEGATIVE
Leukocytes,UA: NEGATIVE
Nitrite, UA: NEGATIVE
Protein,UA: NEGATIVE
Specific Gravity, UA: 1.02 (ref 1.005–1.030)
Urobilinogen, Ur: 1 mg/dL (ref 0.2–1.0)
pH, UA: 7 (ref 5.0–7.5)

## 2021-08-16 LAB — MICROSCOPIC EXAMINATION
Bacteria, UA: NONE SEEN
WBC, UA: NONE SEEN /hpf (ref 0–5)

## 2021-08-16 NOTE — Progress Notes (Signed)
BP 116/73   Pulse 91   Ht 5\' 3"  (1.6 m)   Wt 120 lb (54.4 kg)   BMI 21.26 kg/m    Subjective:    Patient ID: , female    DOB: 01-01-1996, 25 y.o.   MRN: [id number]  HPI: Chelsea Parks is a 25 y.o. 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 She is feeling well. She has been following with psychiatry. She notes that they don't think that she has bipolar. She is scheduled to start class in January. She has no concerns. She had her physical in March.   Relevant past medical, surgical, family and social history reviewed and updated as indicated. Interim medical history since our last visit reviewed. Allergies and medications reviewed and updated.  Review of Systems  Constitutional: Negative.   HENT: Negative.    Eyes: Negative.   Respiratory: Negative.    Cardiovascular: Negative.   Gastrointestinal: Negative.   Endocrine: Negative.   Genitourinary: Negative.   Musculoskeletal: Negative.   Skin: Negative.   Allergic/Immunologic: Negative.   Neurological: Negative.   Hematological: Negative.   Psychiatric/Behavioral: Negative.     Per HPI unless specifically indicated above     Objective:    BP 116/73   Pulse 91   Ht 5\' 3"  (1.6 m)   Wt 120 lb (54.4 kg)   BMI 21.26 kg/m   Wt Readings from Last 3 Encounters:  08/16/21 120 lb (54.4 kg)  07/18/21 118 lb 9.6 oz (53.8 kg)  01/16/21 118 lb 3.2 oz (53.6 kg)   No results found.  Physical Exam Vitals and nursing note reviewed.  Constitutional:      General: She is not in acute distress.    Appearance: Normal appearance. She is normal weight. She is not ill-appearing, toxic-appearing or diaphoretic.  HENT:     Head: Normocephalic and atraumatic.     Right Ear: Tympanic membrane, ear canal and external ear normal. There is no impacted cerumen.     Left  Ear: Tympanic membrane, ear canal and external ear normal. There is no impacted cerumen.     Nose: Nose normal. No congestion or rhinorrhea.     Mouth/Throat:     Mouth: Mucous membranes are moist.     Pharynx: Oropharynx is clear. No oropharyngeal exudate or posterior oropharyngeal erythema.  Eyes:     General: No scleral icterus.       Right eye: No discharge.        Left eye: No discharge.     Extraocular Movements: Extraocular movements intact.     Conjunctiva/sclera: Conjunctivae normal.     Pupils: Pupils are equal, round, and reactive to light.  Neck:     Vascular: No carotid bruit.  Cardiovascular:     Rate and Rhythm: Normal rate and regular rhythm.     Pulses: Normal pulses.     Heart sounds: No murmur heard.   No friction rub. No gallop.  Pulmonary:     Effort: Pulmonary effort is normal. No respiratory distress.     Breath sounds: Normal breath sounds. No stridor. No wheezing, rhonchi or rales.  Chest:     Chest wall: No tenderness.  Abdominal:     General: Abdomen is flat. Bowel sounds are normal. There is no distension.     Palpations: Abdomen is soft.  There is no mass.     Tenderness: There is no abdominal tenderness. There is no right CVA tenderness, left CVA tenderness, guarding or rebound.     Hernia: No hernia is present.  Genitourinary:    Comments: Breast and pelvic exams deferred with shared decision making Musculoskeletal:        General: No swelling, tenderness, deformity or signs of injury.     Cervical back: Normal range of motion and neck supple. No rigidity. No muscular tenderness.     Right lower leg: No edema.     Left lower leg: No edema.  Lymphadenopathy:     Cervical: No cervical adenopathy.  Skin:    General: Skin is warm and dry.     Capillary Refill: Capillary refill takes less than 2 seconds.     Coloration: Skin is not jaundiced or pale.     Findings: No bruising, erythema, lesion or rash.  Neurological:     General: No focal deficit  present.     Mental Status: She is alert and oriented to person, place, and time. Mental status is at baseline.     Cranial Nerves: No cranial nerve deficit.     Sensory: No sensory deficit.     Motor: No weakness.     Coordination: Coordination normal.     Gait: Gait normal.     Deep Tendon Reflexes: Reflexes normal.  Psychiatric:        Mood and Affect: Mood normal.        Behavior: Behavior normal.        Thought Content: Thought content normal.        Judgment: Judgment normal.    Results for orders placed or performed in visit on 07/18/21  Cytology - PAP  Result Value Ref Range   Adequacy      Satisfactory for evaluation; transformation zone component PRESENT.   Diagnosis      - Negative for intraepithelial lesion or malignancy (NILM)      Assessment & Plan:   Problem List Items Addressed This Visit   None Visit Diagnoses     Pre-employment examination    -  Primary   Paperwork filled out. See scanned documents. Call with any concerns.    Relevant Orders   Urinalysis, Routine w reflex microscopic   Screening for tuberculosis       Quantiferon drawn today. Await results.    Relevant Orders   QuantiFERON-TB Gold Plus        Follow up plan: Return March for physical with PCP (After 01/12/21).

## 2021-08-23 LAB — QUANTIFERON-TB GOLD PLUS
QuantiFERON Mitogen Value: >10 IU/mL
QuantiFERON Nil Value: 0.05 IU/mL
QuantiFERON TB1 Ag Value: 0.03 IU/mL
QuantiFERON TB2 Ag Value: 0.05 IU/mL
QuantiFERON-TB Gold Plus: NEGATIVE

## 2021-08-24 ENCOUNTER — Ambulatory Visit (INDEPENDENT_AMBULATORY_CARE_PROVIDER_SITE_OTHER): Payer: Medicaid Other | Admitting: Nurse Practitioner

## 2021-08-24 ENCOUNTER — Other Ambulatory Visit: Payer: Self-pay

## 2021-08-24 ENCOUNTER — Encounter: Payer: Self-pay | Admitting: Nurse Practitioner

## 2021-08-24 VITALS — BP 99/70 | HR 91 | Ht 63.0 in | Wt 120.0 lb

## 2021-08-24 DIAGNOSIS — R300 Dysuria: Secondary | ICD-10-CM

## 2021-08-24 DIAGNOSIS — N3000 Acute cystitis without hematuria: Secondary | ICD-10-CM | POA: Diagnosis not present

## 2021-08-24 LAB — MICROSCOPIC EXAMINATION
Bacteria, UA: NONE SEEN
RBC, Urine: NONE SEEN /hpf (ref 0–2)

## 2021-08-24 LAB — URINALYSIS, ROUTINE W REFLEX MICROSCOPIC
Bilirubin, UA: NEGATIVE
Glucose, UA: NEGATIVE
Ketones, UA: NEGATIVE
Nitrite, UA: NEGATIVE
Protein,UA: NEGATIVE
RBC, UA: NEGATIVE
Specific Gravity, UA: 1.02 (ref 1.005–1.030)
Urobilinogen, Ur: 0.2 mg/dL (ref 0.2–1.0)
pH, UA: 7 (ref 5.0–7.5)

## 2021-08-24 MED ORDER — NITROFURANTOIN MONOHYD MACRO 100 MG PO CAPS
100.0000 mg | ORAL_CAPSULE | Freq: Two times a day (BID) | ORAL | 0 refills | Status: DC
Start: 2021-08-24 — End: 2021-09-14

## 2021-08-24 NOTE — Progress Notes (Signed)
BP 99/70   Pulse 91   Ht 5\' 3"  (1.6 m)   Wt 120 lb (54.4 kg)   SpO2 98%   BMI 21.26 kg/m    Subjective:    Patient ID: , female    DOB: 05/16/1996, 25 y.o.   MRN: [id number]  HPI: Chelsea Parks is a 25 y.o. female  Chief Complaint  Patient presents with   Urinary Frequency   URINARY SYMPTOMS Dysuria: yes Urinary frequency:  yes, feels like she can go 3-4 times within an hour. Urgency: yes Small volume voids: no Symptom severity: yes Urinary incontinence: no Foul odor: yes Hematuria: no Abdominal pain: no Back pain: no Suprapubic pain/pressure: no Flank pain: no Fever:  no Vomiting: no Relief with cranberry juice: no Relief with pyridium: no Status: better/worse/stable Previous urinary tract infection: no Recurrent urinary tract infection: no Sexual activity: No sexually active/monogomous/practicing safe sex History of sexually transmitted disease: no Penile discharge: no Treatments attempted: none   Relevant past medical, surgical, family and social history reviewed and updated as indicated. Interim medical history since our last visit reviewed. Allergies and medications reviewed and updated.  Review of Systems  Constitutional:  Negative for fever.  Gastrointestinal:  Negative for abdominal pain and vomiting.  Genitourinary:  Positive for decreased urine volume, dysuria and frequency. Negative for flank pain, hematuria and urgency.  Musculoskeletal:  Negative for back pain.   Per HPI unless specifically indicated above     Objective:    BP 99/70   Pulse 91   Ht 5\' 3"  (1.6 m)   Wt 120 lb (54.4 kg)   SpO2 98%   BMI 21.26 kg/m   Wt Readings from Last 3 Encounters:  08/24/21 120 lb (54.4 kg)  08/16/21 120 lb (54.4 kg)  07/18/21 118 lb 9.6 oz (53.8 kg)    Physical Exam Vitals and nursing note reviewed.  Constitutional:      General: She is not in acute distress.    Appearance: Normal appearance. She is normal weight. She is not  ill-appearing, toxic-appearing or diaphoretic.  HENT:     Head: Normocephalic.     Right Ear: External ear normal.     Left Ear: External ear normal.     Nose: Nose normal.     Mouth/Throat:     Mouth: Mucous membranes are moist.     Pharynx: Oropharynx is clear.  Eyes:     General:        Right eye: No discharge.        Left eye: No discharge.     Extraocular Movements: Extraocular movements intact.     Conjunctiva/sclera: Conjunctivae normal.     Pupils: Pupils are equal, round, and reactive to light.  Cardiovascular:     Rate and Rhythm: Normal rate and regular rhythm.     Heart sounds: No murmur heard. Pulmonary:     Effort: Pulmonary effort is normal. No respiratory distress.     Breath sounds: Normal breath sounds. No wheezing or rales.  Abdominal:     General: Abdomen is flat. Bowel sounds are normal. There is no distension.     Palpations: Abdomen is soft.     Tenderness: There is no abdominal tenderness. There is no right CVA tenderness, left CVA tenderness or guarding.  Musculoskeletal:     Cervical back: Normal range of motion and neck supple.  Skin:    General: Skin is warm and dry.     Capillary Refill: Capillary refill  takes less than 2 seconds.  Neurological:     General: No focal deficit present.     Mental Status: She is alert and oriented to person, place, and time. Mental status is at baseline.  Psychiatric:        Mood and Affect: Mood normal.        Behavior: Behavior normal.        Thought Content: Thought content normal.        Judgment: Judgment normal.    Results for orders placed or performed in visit on 08/16/21  Microscopic Examination   Urine  Result Value Ref Range   WBC, UA None seen 0 - 5 /hpf   RBC 0-2 0 - 2 /hpf   Epithelial Cells (non renal) 0-10 0 - 10 /hpf   Bacteria, UA None seen None seen/Few  Urinalysis, Routine w reflex microscopic  Result Value Ref Range   Specific Gravity, UA 1.020 1.005 - 1.030   pH, UA 7.0 5.0 - 7.5    Color, UA Yellow Yellow   Appearance Ur Clear Clear   Leukocytes,UA Negative Negative   Protein,UA Negative Negative/Trace   Glucose, UA Negative Negative   Ketones, UA Negative Negative   RBC, UA Trace (A) Negative   Bilirubin, UA Negative Negative   Urobilinogen, Ur 1.0 0.2 - 1.0 mg/dL   Nitrite, UA Negative Negative   Microscopic Examination See below:   QuantiFERON-TB Gold Plus  Result Value Ref Range   QuantiFERON Incubation Incubation performed.    QuantiFERON Criteria Comment    QuantiFERON TB1 Ag Value 0.03 IU/mL   QuantiFERON TB2 Ag Value 0.05 IU/mL   QuantiFERON Nil Value 0.05 IU/mL   QuantiFERON Mitogen Value >10.00 IU/mL   QuantiFERON-TB Gold Plus Negative Negative      Assessment & Plan:   Problem List Items Addressed This Visit   None Visit Diagnoses     Dysuria    -  Primary   Relevant Orders   Urinalysis, Routine w reflex microscopic        Follow up plan: Return if symptoms worsen or fail to improve.

## 2021-08-27 DIAGNOSIS — H5213 Myopia, bilateral: Secondary | ICD-10-CM | POA: Diagnosis not present

## 2021-08-27 NOTE — Progress Notes (Signed)
Results discussed with patient during visit. Sent for culture and treated with Macrobid.

## 2021-08-28 ENCOUNTER — Encounter: Payer: Self-pay | Admitting: Family Medicine

## 2021-08-28 LAB — URINE CULTURE

## 2021-08-28 NOTE — Progress Notes (Signed)
Hi Chelsea Parks.  Your urine culture did not grow any bacteria.  There is no need for further antibiotic treatment.

## 2021-08-29 NOTE — Telephone Encounter (Signed)
Copied from CRM [id number]. 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

## 2021-08-30 DIAGNOSIS — F322 Major depressive disorder, single episode, severe without psychotic features: Secondary | ICD-10-CM | POA: Diagnosis not present

## 2021-08-30 NOTE — Telephone Encounter (Signed)
Pt just picked up paperwork.

## 2021-09-03 DIAGNOSIS — F322 Major depressive disorder, single episode, severe without psychotic features: Secondary | ICD-10-CM | POA: Diagnosis not present

## 2021-09-12 ENCOUNTER — Ambulatory Visit: Payer: Self-pay

## 2021-09-12 NOTE — Telephone Encounter (Addendum)
Pt. Reports she started having symptoms 08/27/21. Cough, runny nose, wheezing. Reports they also recently got a cat and "maybe I'm allergic." COVID 19 test negative. Appointment made. Instructed to call back if symptoms worsen.     Reason for Disposition  Cough has been present for > 3 weeks  Answer Assessment - Initial Assessment Questions 1. ONSET: "When did the cough begin?"      08/27/21 2. SEVERITY: "How bad is the cough today?"      Worse at night 3. SPUTUM: "Describe the color of your sputum" (none, dry cough; clear, white, yellow, green)     Yellow 4. HEMOPTYSIS: "Are you coughing up any blood?" If so ask: "How much?" (flecks, streaks, tablespoons, etc.)     No 5. DIFFICULTY BREATHING: "Are you having difficulty breathing?" If Yes, ask: "How bad is it?" (e.g., mild, moderate, severe)    - MILD: No SOB at rest, mild SOB with walking, speaks normally in sentences, can lie down, no retractions, pulse < 100.    - MODERATE: SOB at rest, SOB with minimal exertion and prefers to sit, cannot lie down flat, speaks in phrases, mild retractions, audible wheezing, pulse 100-120.    - SEVERE: Very SOB at rest, speaks in single words, struggling to breathe, sitting hunched forward, retractions, pulse > 120      Mild 6. FEVER: "Do you have a fever?" If Yes, ask: "What is your temperature, how was it measured, and when did it start?"     No 7. CARDIAC HISTORY: "Do you have any history of heart disease?" (e.g., heart attack, congestive heart failure)      No 8. LUNG HISTORY: "Do you have any history of lung disease?"  (e.g., pulmonary embolus, asthma, emphysema)     No 9. PE RISK FACTORS: "Do you have a history of blood clots?" (or: recent major surgery, recent prolonged travel, bedridden)     No 10. OTHER SYMPTOMS: "Do you have any other symptoms?" (e.g., runny nose, wheezing, chest pain)       Wheezing 11. PREGNANCY: "Is there any chance you are pregnant?" "When was your last menstrual  period?"       No 12. TRAVEL: "Have you traveled out of the country in the last month?" (e.g., travel history, exposures)       No  Protocols used: Cough - Acute Productive-A-AH

## 2021-09-13 DIAGNOSIS — F322 Major depressive disorder, single episode, severe without psychotic features: Secondary | ICD-10-CM | POA: Diagnosis not present

## 2021-09-13 NOTE — Progress Notes (Signed)
Acute Office Visit  Subjective:    Patient ID: Chelsea Parks, female    DOB: Nov 18, 1995, 25 y.o.   MRN: [id number]  Chief Complaint  Patient presents with   Nasal Congestion    Patient states she is having congestion   Cough    Patient states she is wheezing and coughing. Patient states she coughs up yellow mucus. Patient states she has been having symptoms for about 3 weeks. Patient has tried otc nyquil, mucinex, sudafed. Patient states she tested for COVID a week ago.      HPI Patient is in today for congestion for 3 weeks and cough for 1 week. She stated her son was sick around [place] with a cold and now it is going around her house. She also got a cat recently and she has a history of allergies to cats.   UPPER RESPIRATORY TRACT INFECTION  Worst symptom: cough, wheezing Fever: no Cough: yes Shortness of breath: yes Wheezing: yes Chest pain: no Chest tightness: no Chest congestion: yes Nasal congestion: yes Runny nose: yes Post nasal drip: no Sneezing: yes Sore throat: no Swollen glands: yes Sinus pressure: no Headache: no Face pain: no Toothache: no Ear pain: no  Ear pressure: yes  Eyes red/itching:yes Eye drainage/crusting: yes  Vomiting: no Rash: no Fatigue: yes Sick contacts: yes - son around Halloween Strep contacts: no  Context: worse Recurrent sinusitis: no Relief with OTC cold/cough medications: yes  Treatments attempted: son's albuterol, zyrtec, alka seltzer flu and cold, mucinex, sudafed, dayquil (not at the same time)    Past Medical History:  Diagnosis Date   Anxiety    Bipolar disorder ([place]) 2019   Depression    H/O postpartum depression, currently pregnant    Herpes genitalis in women    High cholesterol    Pelvic floor dysfunction in female 10/2019   PMDD (premenstrual dysphoric disorder)    PMDD (premenstrual dysphoric disorder)     Past Surgical History:  Procedure Laterality Date   CERVICAL BIOPSY     no surgical history       Family History  Problem Relation Age of Onset   Hypertension Mother    Hyperlipidemia Mother    Healthy Father    Pancreatic cancer Maternal Uncle    Stroke Maternal Grandfather    Cancer Maternal Grandfather    Lupus Paternal Aunt    GER disease Paternal Grandmother    Kidney cancer Neg Hx     Social History   Socioeconomic History   Marital status: Married    Spouse name: Not on file   Number of children: 3   Years of education: Not on file   Highest education level: Not on file  Occupational History   Not on file  Tobacco Use   Smoking status: Former   Smokeless tobacco: Never  Vaping Use   Vaping Use: Never used  Substance and Sexual Activity   Alcohol use: Not Currently    Alcohol/week: 0.0 standard drinks    Comment: occasionally   Drug use: No   Sexual activity: Yes    Birth control/protection: None, Injection  Other Topics Concern   Not on file  Social History Narrative   Not on file   Social Determinants of Health   Financial Resource Strain: Not on file  Food Insecurity: Not on file  Transportation Needs: Not on file  Physical Activity: Not on file  Stress: Not on file  Social Connections: Not on file  Intimate Partner Violence: Not  on file    Outpatient Medications Prior to Visit  Medication Sig Dispense Refill   diazepam (VALIUM) 5 MG tablet Take 5 mg by mouth every 6 (six) hours as needed for anxiety.     FLUoxetine (PROZAC) 20 MG tablet Take 20 mg by mouth daily.     medroxyPROGESTERone (DEPO-SUBQ PROVERA 104) 104 MG/0.65ML injection Inject 0.65 mLs (104 mg total) into the skin every 3 (three) months. 0.65 mL 3   Multiple Vitamin (MULTIVITAMIN) tablet Take by mouth.     valACYclovir (VALTREX) 500 MG tablet Take 1 tablet (500 mg total) by mouth daily. 30 tablet 1   nitrofurantoin, macrocrystal-monohydrate, (MACROBID) 100 MG capsule Take 1 capsule (100 mg total) by mouth 2 (two) times daily. (Patient not taking: Reported on 09/14/2021) 10  capsule 0   No facility-administered medications prior to visit.    Allergies  Allergen Reactions   Amoxicillin Diarrhea    Review of Systems  Constitutional:  Positive for fatigue. Negative for fever.  HENT:  Positive for congestion, rhinorrhea and sneezing. Negative for ear pain, postnasal drip, sinus pressure and sore throat.   Eyes:  Positive for redness and itching.  Respiratory:  Positive for cough, shortness of breath and wheezing.   Cardiovascular: Negative.   Gastrointestinal: Negative.   Genitourinary: Negative.   Musculoskeletal: Negative.   Skin: Negative.   Neurological: Negative.       Objective:    Physical Exam Vitals and nursing note reviewed.  Constitutional:      General: She is not in acute distress.    Appearance: Normal appearance.  HENT:     Head: Normocephalic.  Eyes:     Conjunctiva/sclera: Conjunctivae normal.  Pulmonary:     Effort: Pulmonary effort is normal.     Comments: Able to talk in complete sentences Neurological:     Mental Status: She is alert and oriented to person, place, and time.  Psychiatric:        Mood and Affect: Mood normal.        Behavior: Behavior normal.        Thought Content: Thought content normal.        Judgment: Judgment normal.    There were no vitals taken for this visit. Wt Readings from Last 3 Encounters:  08/24/21 120 lb (54.4 kg)  08/16/21 120 lb (54.4 kg)  07/18/21 118 lb 9.6 oz (53.8 kg)    Health Maintenance Due  Topic Date Due   COVID-19 Vaccine (1) Never done   Hepatitis C Screening  Never done   HPV VACCINES (3 - 3-dose series) 11/15/2021       Topic Date Due   HPV VACCINES (3 - 3-dose series) 11/15/2021     Lab Results  Component Value Date   TSH 1.080 01/12/2021   Lab Results  Component Value Date   WBC 9.4 01/12/2021   HGB 14.3 01/12/2021   HCT 40.9 01/12/2021   MCV 93 01/12/2021   PLT 303 01/12/2021   Lab Results  Component Value Date   NA 139 01/12/2021   K 3.7  01/12/2021   CO2 20 01/12/2021   GLUCOSE 84 01/12/2021   BUN 7 01/12/2021   CREATININE 0.84 01/12/2021   BILITOT 0.3 01/12/2021   ALKPHOS 88 01/12/2021   AST 17 01/12/2021   ALT 6 01/12/2021   PROT 7.7 01/12/2021   ALBUMIN 5.4 (H) 01/12/2021   CALCIUM 10.1 01/12/2021   ANIONGAP 10 06/04/2020   EGFR 99 01/12/2021   Lab Results  Component Value Date   CHOL 164 01/12/2021   Lab Results  Component Value Date   HDL 45 01/12/2021   Lab Results  Component Value Date   LDLCALC 107 (H) 01/12/2021   Lab Results  Component Value Date   TRIG 63 01/12/2021   Lab Results  Component Value Date   CHOLHDL 3.6 01/12/2021   No results found for: HGBA1C     Assessment & Plan:   Problem List Items Addressed This Visit       Respiratory   Upper respiratory tract infection - Primary    With symptoms ongoing for over 3 weeks, will treat with prednisone, doxycycline, and albuterol inhaler prn. She can continue OTC cold/flu medications. Increase fluids and encouraged rest. Recommend she starts taking zyrtec daily, even after her symptoms resolve with the history of allergy to cats. If her symptoms are persistent, she may need a referral to allergist for allergy shots as she plans to keep the cat. Follow up if symptoms don't improve or worsen.         Meds ordered this encounter  Medications   predniSONE (DELTASONE) 10 MG tablet    Sig: Take 6 tablets today, then 5 tablets tomorrow, then decrease by 1 tablet every day until gone    Dispense:  21 tablet    Refill:  0   albuterol (VENTOLIN HFA) 108 (90 Base) MCG/ACT inhaler    Sig: Inhale 2 puffs into the lungs every 6 (six) hours as needed for wheezing or shortness of breath.    Dispense:  8 g    Refill:  0   doxycycline (VIBRA-TABS) 100 MG tablet    Sig: Take 1 tablet (100 mg total) by mouth 2 (two) times daily.    Dispense:  20 tablet    Refill:  0     This visit was completed via MyChart due to the restrictions of the  COVID-19 pandemic. All issues as above were discussed and addressed. Physical exam was done as above through visual confirmation on MyChart. If it was felt that the patient should be evaluated in the office, they were directed there. The patient verbally consented to this visit. Location of the patient: home Location of the provider: work Those involved with this call:  Provider: Vance Peper, NP CMA: Louanna Raw, [place] Desk/Registration: Myrlene Broker  Time spent on call:  10 minutes with patient face to face via video conference. More than 50% of this time was spent in counseling and coordination of care. 10 minutes total spent in review of patient's record and preparation of their chart.   Charyl Dancer, NP

## 2021-09-14 ENCOUNTER — Encounter: Payer: Self-pay | Admitting: Nurse Practitioner

## 2021-09-14 ENCOUNTER — Telehealth (INDEPENDENT_AMBULATORY_CARE_PROVIDER_SITE_OTHER): Payer: Medicaid Other | Admitting: Nurse Practitioner

## 2021-09-14 DIAGNOSIS — J069 Acute upper respiratory infection, unspecified: Secondary | ICD-10-CM | POA: Diagnosis not present

## 2021-09-14 MED ORDER — ALBUTEROL SULFATE HFA 108 (90 BASE) MCG/ACT IN AERS: 2.0000 | INHALATION_SPRAY | Freq: Four times a day (QID) | RESPIRATORY_TRACT | 0 refills | Status: DC | PRN

## 2021-09-14 MED ORDER — DOXYCYCLINE HYCLATE 100 MG PO TABS: 100.0000 mg | ORAL_TABLET | Freq: Two times a day (BID) | ORAL | 0 refills | Status: DC

## 2021-09-14 MED ORDER — PREDNISONE 10 MG PO TABS: ORAL_TABLET | ORAL | 0 refills | Status: DC

## 2021-09-14 NOTE — Assessment & Plan Note (Addendum)
With symptoms ongoing for over 3 weeks, will treat with prednisone, doxycycline, and albuterol inhaler prn. She can continue OTC cold/flu medications. Increase fluids and encouraged rest. Recommend she starts taking zyrtec daily, even after her symptoms resolve with the history of allergy to cats. If her symptoms are persistent, she may need a referral to allergist for allergy shots as she plans to keep the cat. Follow up if symptoms don't improve or worsen.

## 2021-09-24 ENCOUNTER — Telehealth: Payer: Self-pay | Admitting: Nurse Practitioner

## 2021-09-24 NOTE — Telephone Encounter (Signed)
Pt dropped off forms for employment to be completed. Pt last saw Dr. Laural Benes to get original forms filled out. Placed forms in pcp's folder.

## 2021-09-25 NOTE — Telephone Encounter (Signed)
Spoke with pt and she says she saw Dr. Laural Benes for these forms to be completed last month. I verified she did see her. Her job just added new forms to fill out. What do you recommend?

## 2021-09-25 NOTE — Progress Notes (Signed)
There were no vitals taken for this visit.   Subjective:    Patient ID: Chelsea Parks, female    DOB: 10/07/1996, 25 y.o.   MRN: [id number]  HPI: Chelsea Parks is a 25 y.o. female  Chief Complaint  Patient presents with   URI    Pt states she is still having sinus problems. States she is having a runny nose, itchy eyes, and a little bit if SOB. States she just recently finished the round of Prednisone and Doxycycline. States she recently adopted a cat and does have some allergy to them. States she started feeling a little bit better but symptoms worsened after finishing the medications.    UPPER RESPIRATORY TRACT INFECTION Worst symptom: Patient states they have been going on for about 1 month Fever: no Cough: yes Shortness of breath: yes- this has improved Wheezing: yes Chest pain: no Chest tightness: no Chest congestion: no Nasal congestion: yes Runny nose: yes Post nasal drip: yes Sneezing:  occasionally Sore throat: no Swollen glands: no Sinus pressure: yes Headache: no Face pain: no Toothache: no Ear pain: no bilateral Ear pressure: no bilateral Eyes red/itching:yes- occasional itching Eye drainage/crusting: no  Vomiting: no Rash: no Fatigue: yes Sick contacts: yes- kids have been sick  Strep contacts: no  Context: better Recurrent sinusitis: no Relief with OTC cold/cough medications: yes  Treatments attempted:  Zyrtec  and cold and flu  Relevant past medical, surgical, family and social history reviewed and updated as indicated. Interim medical history since our last visit reviewed. Allergies and medications reviewed and updated.  Review of Systems  Constitutional:  Positive for fatigue. Negative for fever.  HENT:  Positive for congestion, postnasal drip, rhinorrhea, sinus pressure and sneezing. Negative for dental problem, ear pain, sinus pain and sore throat.   Respiratory:  Positive for cough and shortness of breath. Negative for wheezing.    Cardiovascular:  Negative for chest pain.  Gastrointestinal:  Negative for vomiting.  Skin:  Negative for rash.  Neurological:  Negative for headaches.   Per HPI unless specifically indicated above     Objective:    There were no vitals taken for this visit.  Wt Readings from Last 3 Encounters:  08/24/21 120 lb (54.4 kg)  08/16/21 120 lb (54.4 kg)  07/18/21 118 lb 9.6 oz (53.8 kg)    Physical Exam Vitals and nursing note reviewed.  HENT:     Head: Normocephalic.     Right Ear: Hearing normal.     Left Ear: Hearing normal.     Nose: Nose normal.  Eyes:     Pupils: Pupils are equal, round, and reactive to light.  Pulmonary:     Effort: Pulmonary effort is normal. No respiratory distress.  Neurological:     Mental Status: She is alert.  Psychiatric:        Mood and Affect: Mood normal.        Behavior: Behavior normal.        Thought Content: Thought content normal.        Judgment: Judgment normal.    Results for orders placed or performed in visit on 08/24/21  Urine Culture   Specimen: Urine   UR  Result Value Ref Range   Urine Culture, Routine Final report    Organism ID, Bacteria Comment   Microscopic Examination   Urine  Result Value Ref Range   WBC, UA 0-5 0 - 5 /hpf   RBC None seen 0 - 2 /hpf  Epithelial Cells (non renal) 0-10 0 - 10 /hpf   Bacteria, UA None seen None seen/Few  Urinalysis, Routine w reflex microscopic  Result Value Ref Range   Specific Gravity, UA 1.020 1.005 - 1.030   pH, UA 7.0 5.0 - 7.5   Color, UA Yellow Yellow   Appearance Ur Clear Clear   Leukocytes,UA Trace (A) Negative   Protein,UA Negative Negative/Trace   Glucose, UA Negative Negative   Ketones, UA Negative Negative   RBC, UA Negative Negative   Bilirubin, UA Negative Negative   Urobilinogen, Ur 0.2 0.2 - 1.0 mg/dL   Nitrite, UA Negative Negative   Microscopic Examination See below:       Assessment & Plan:   Problem List Items Addressed This Visit       Other    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 If symptoms do not improve recommend she see Allergist.  Should continue with Zyrtec and Flonase. Patient has already completed course of doxycycline.  Symptoms are likely related to allergies.       Relevant Orders   Ambulatory referral to Allergy     Follow up plan: Return if symptoms worsen or fail to improve.   This visit was completed via MyChart due to the restrictions of the COVID-19 pandemic. All issues as above were discussed and addressed. Physical exam was done as above through visual confirmation on MyChart. If it was felt that the patient should be evaluated in the office, they were directed there. The patient verbally consented to this visit. Location of the patient: Home Location of the provider: Office Those involved with this call:  Provider: Larae Grooms, NP CMA: Wilhemena Durie, CMA Front Desk/Registration: Channing Mutters This encounter was conducted via video.  I spent 15 dedicated to the care of this patient on the date of this encounter to include previsit review of 20, face to face time with the patient, and post visit ordering of testing.

## 2021-09-25 NOTE — Telephone Encounter (Signed)
I will need to see form

## 2021-09-26 ENCOUNTER — Encounter: Payer: Self-pay | Admitting: Nurse Practitioner

## 2021-09-26 ENCOUNTER — Telehealth (INDEPENDENT_AMBULATORY_CARE_PROVIDER_SITE_OTHER): Payer: Medicaid Other | Admitting: Nurse Practitioner

## 2021-09-26 ENCOUNTER — Other Ambulatory Visit: Payer: Self-pay

## 2021-09-26 ENCOUNTER — Ambulatory Visit
Admission: EM | Admit: 2021-09-26 | Discharge: 2021-09-26 | Disposition: A | Discharge status: Home / Self Care | Payer: Medicaid Other | Attending: Emergency Medicine | Admitting: Emergency Medicine

## 2021-09-26 DIAGNOSIS — Z20828 Contact with and (suspected) exposure to other viral communicable diseases: Secondary | ICD-10-CM

## 2021-09-26 DIAGNOSIS — B349 Viral infection, unspecified: Secondary | ICD-10-CM | POA: Diagnosis not present

## 2021-09-26 DIAGNOSIS — J3081 Allergic rhinitis due to animal (cat) (dog) hair and dander: Secondary | ICD-10-CM | POA: Diagnosis not present

## 2021-09-26 MED ORDER — OSELTAMIVIR PHOSPHATE 75 MG PO CAPS: 75.0000 mg | ORAL_CAPSULE | Freq: Two times a day (BID) | ORAL | 0 refills | Status: DC

## 2021-09-26 MED ORDER — PREDNISONE 10 MG PO TABS: 10.0000 mg | ORAL_TABLET | Freq: Every day | ORAL | 0 refills | Status: DC

## 2021-09-26 NOTE — ED Triage Notes (Signed)
Pt here with C/O cough since Monday. Was exposed to the flu over the weekend.

## 2021-09-26 NOTE — Discharge Instructions (Signed)
Influenza exposure, treating w tamiflu.  Rest, push fluids, may alternate Tylenol ibuprofen as label directed for weight-based dosing.  Follow-up with PCP

## 2021-09-26 NOTE — Assessment & Plan Note (Signed)
Will give 12 day course of steroids. If symptoms do not improve recommend she see Allergist.  Should continue with Zyrtec and Flonase. Patient has already completed course of doxycycline.  Symptoms are likely related to allergies.

## 2021-09-26 NOTE — Telephone Encounter (Signed)
Spoke with pt and she questioned why she had to get another physical. I let pt know that her last visit was an office visit and not a physical. Informed pt that her last physical was done in September at her OBGYN. Pt has concerns about insurance covering another physical. She said she would call back to schedule when she gets the insurance figured out.

## 2021-09-26 NOTE — ED Provider Notes (Signed)
[facility]    CSN: [id number] Arrival date & time: 09/26/21  1846      History   Chief Complaint Chief Complaint  Patient presents with   Cough    HPI Chelsea Parks is a 25 y.o. female.   Pt here with cough since Monday, exposed to flu over the weekend  The history is provided by the patient. No language interpreter was used.   Past Medical History:  Diagnosis Date   Anxiety    Bipolar disorder ([facility]) 2019   Depression    H/O postpartum depression, currently pregnant    Herpes genitalis in women    High cholesterol    Pelvic floor dysfunction in female 10/2019   PMDD (premenstrual dysphoric disorder)    PMDD (premenstrual dysphoric disorder)     Patient Active Problem List   Diagnosis Date Noted   Allergy to cats 09/26/2021   Exposure to the flu 09/26/2021   Viral illness 09/26/2021   Upper respiratory tract infection 09/14/2021   Coccyx pain 03/23/2020   PTSD (post-traumatic stress disorder) 12/20/2019   Pelvic floor dysfunction 12/20/2019   HSV infection 09/09/2018   Bipolar 1 disorder ([facility]) 09/10/2017    Past Surgical History:  Procedure Laterality Date   CERVICAL BIOPSY     no surgical history      OB History     Gravida  3   Para  3   Term  3   Preterm      AB      Living  3      SAB      IAB      Ectopic      Multiple  0   Live Births  3            Home Medications    Prior to Admission medications   Medication Sig Start Date End Date Taking? Authorizing Provider  albuterol (VENTOLIN HFA) 108 (90 Base) MCG/ACT inhaler Inhale 2 puffs into the lungs every 6 (six) hours as needed for wheezing or shortness of breath. 09/14/21  Yes McElwee, Lauren A, NP  diazepam (VALIUM) 5 MG tablet Take 5 mg by mouth every 6 (six) hours as needed for anxiety.   Yes [provider]  FLUoxetine (PROZAC) 20 MG tablet Take 20 mg by mouth daily.   Yes [provider]  medroxyPROGESTERone (DEPO-SUBQ PROVERA  104) 104 MG/0.65ML injection Inject 0.65 mLs (104 mg total) into the skin every 3 (three) months. 07/18/21  Yes Hildred Laser, MD  Multiple Vitamin (MULTIVITAMIN) tablet Take by mouth.   Yes [provider]  oseltamivir (TAMIFLU) 75 MG capsule Take 1 capsule (75 mg total) by mouth every 12 (twelve) hours. 09/26/21  Yes Collene Massimino, Para March, NP  predniSONE (DELTASONE) 10 MG tablet Take 1 tablet (10 mg total) by mouth daily with breakfast. Take 6 tabs for 2 days, 5 tabs for 2 days, and decrease by 1 tab every other day. 09/26/21   Larae Grooms, NP  valACYclovir (VALTREX) 500 MG tablet Take 1 tablet (500 mg total) by mouth daily. 08/28/20   Michiel Cowboy A, PA-C  ARIPiprazole (ABILIFY) 10 MG tablet Take 15 mg by mouth daily.   06/06/20  [provider]    Family History Family History  Problem Relation Age of Onset   Hypertension Mother    Hyperlipidemia Mother    Healthy Father    Pancreatic cancer Maternal Uncle    Stroke Maternal Grandfather    Cancer Maternal  Grandfather    Lupus Paternal Aunt    GER disease Paternal Grandmother    Kidney cancer Neg Hx     Social History Social History   Tobacco Use   Smoking status: Former   Smokeless tobacco: Never  Building services engineer Use: Never used  Substance Use Topics   Alcohol use: Not Currently    Alcohol/week: 0.0 standard drinks    Comment: occasionally   Drug use: No     Allergies   Amoxicillin   Review of Systems Review of Systems  Respiratory:  Positive for cough.   All other systems reviewed and are negative.   Physical Exam Triage Vital Signs ED Triage Vitals  Enc Vitals Group     BP 09/26/21 1946 110/87     Pulse Rate 09/26/21 1946 (!) 106     Resp 09/26/21 1946 16     Temp 09/26/21 1946 98.1 F (36.7 C)     Temp Source 09/26/21 1946 Oral     SpO2 09/26/21 1946 99 %     Weight 09/26/21 1945 126 lb (57.2 kg)     Height 09/26/21 1945 5\' 3"  (1.6 m)     Head Circumference --      Peak  Flow --      Pain Score 09/26/21 1945 0     Pain Loc --      Pain Edu? --      Excl. in GC? --    No data found.  Updated Vital Signs BP 110/87 (BP Location: Left Arm)   Pulse (!) 106   Temp 98.1 F (36.7 C) (Oral)   Resp 16   Ht 5\' 3"  (1.6 m)   Wt 126 lb (57.2 kg)   SpO2 99%   BMI 22.32 kg/m   Visual Acuity Right Eye Distance:   Left Eye Distance:   Bilateral Distance:    Right Eye Near:   Left Eye Near:    Bilateral Near:     Physical Exam Vitals and nursing note reviewed.  Constitutional:      General: She is not in acute distress.    Appearance: She is well-developed and well-groomed.  HENT:     Head: Normocephalic.     Right Ear: Tympanic membrane is retracted.     Left Ear: Tympanic membrane is retracted.     Nose: Congestion present.     Mouth/Throat:     Lips: Pink.     Mouth: Mucous membranes are moist.     Pharynx: Oropharynx is clear.  Eyes:     General: Lids are normal.     Conjunctiva/sclera: Conjunctivae normal.     Pupils: Pupils are equal, round, and reactive to light.  Neck:     Trachea: No tracheal deviation.  Cardiovascular:     Rate and Rhythm: Regular rhythm. Tachycardia present.     Pulses: Normal pulses.     Heart sounds: Normal heart sounds. No murmur heard. Pulmonary:     Effort: Pulmonary effort is normal.     Breath sounds: Normal breath sounds and air entry.  Abdominal:     General: Bowel sounds are normal.     Palpations: Abdomen is soft.     Tenderness: There is no abdominal tenderness.  Musculoskeletal:        General: Normal range of motion.     Cervical back: Normal range of motion.  Lymphadenopathy:     Cervical: No cervical adenopathy.  Skin:    General: Skin  is warm and dry.     Findings: No rash.  Neurological:     General: No focal deficit present.     Mental Status: She is alert and oriented to person, place, and time.     GCS: GCS eye subscore is 4. GCS verbal subscore is 5. GCS motor subscore is 6.   Psychiatric:        Attention and Perception: Attention normal.        Mood and Affect: Mood normal.        Speech: Speech normal.        Behavior: Behavior normal. Behavior is cooperative.     UC Treatments / Results  Labs (all labs ordered are listed, but only abnormal results are displayed) Labs Reviewed - No data to display  EKG   Radiology No results found.  Procedures Procedures (including critical care time)  Medications Ordered in UC Medications - No data to display  Initial Impression / Assessment and Plan / UC Course  I have reviewed the triage vital signs and the nursing notes.  Pertinent labs & imaging results that were available during my care of the patient were reviewed by me and considered in my medical decision making (see chart for details).     Ddx: flu exposure,viral illness Final Clinical Impressions(s) / UC Diagnoses   Final diagnoses:  Exposure to the flu  Viral illness     Discharge Instructions      Influenza exposure, treating w tamiflu.  Rest, push fluids, may alternate Tylenol ibuprofen as label directed for weight-based dosing.  Follow-up with PCP    ED Prescriptions     Medication Sig Dispense Auth. Provider   oseltamivir (TAMIFLU) 75 MG capsule Take 1 capsule (75 mg total) by mouth every 12 (twelve) hours. 10 capsule Anabia Weatherwax, Jeanett Schlein, NP      PDMP not reviewed this encounter.   Tori Milks, NP [phone]

## 2021-09-27 DIAGNOSIS — Z419 Encounter for procedure for purposes other than remedying health state, unspecified: Secondary | ICD-10-CM | POA: Diagnosis not present

## 2021-09-27 NOTE — Telephone Encounter (Signed)
This is unlikely a physical. This is just that we need to examine her to fill out the form. I'm happy to look at the form and see if I can fill it out based on her last visit- but I'd need to see the form.

## 2021-09-28 NOTE — Telephone Encounter (Signed)
Placed form in providers folder.

## 2021-10-04 DIAGNOSIS — F322 Major depressive disorder, single episode, severe without psychotic features: Secondary | ICD-10-CM | POA: Diagnosis not present

## 2021-10-05 DIAGNOSIS — F322 Major depressive disorder, single episode, severe without psychotic features: Secondary | ICD-10-CM | POA: Diagnosis not present

## 2021-10-12 DIAGNOSIS — F322 Major depressive disorder, single episode, severe without psychotic features: Secondary | ICD-10-CM | POA: Diagnosis not present

## 2021-10-19 DIAGNOSIS — F322 Major depressive disorder, single episode, severe without psychotic features: Secondary | ICD-10-CM | POA: Diagnosis not present

## 2021-10-26 DIAGNOSIS — F331 Major depressive disorder, recurrent, moderate: Secondary | ICD-10-CM | POA: Diagnosis not present

## 2021-10-28 DIAGNOSIS — Z419 Encounter for procedure for purposes other than remedying health state, unspecified: Secondary | ICD-10-CM | POA: Diagnosis not present

## 2021-10-30 DIAGNOSIS — F419 Anxiety disorder, unspecified: Secondary | ICD-10-CM | POA: Diagnosis not present

## 2021-11-02 DIAGNOSIS — F322 Major depressive disorder, single episode, severe without psychotic features: Secondary | ICD-10-CM | POA: Diagnosis not present

## 2021-11-09 DIAGNOSIS — F322 Major depressive disorder, single episode, severe without psychotic features: Secondary | ICD-10-CM | POA: Diagnosis not present

## 2021-11-19 DIAGNOSIS — N3941 Urge incontinence: Secondary | ICD-10-CM | POA: Diagnosis not present

## 2021-11-19 DIAGNOSIS — N9489 Other specified conditions associated with female genital organs and menstrual cycle: Secondary | ICD-10-CM | POA: Diagnosis not present

## 2021-11-23 DIAGNOSIS — F322 Major depressive disorder, single episode, severe without psychotic features: Secondary | ICD-10-CM | POA: Diagnosis not present

## 2021-11-28 DIAGNOSIS — Z419 Encounter for procedure for purposes other than remedying health state, unspecified: Secondary | ICD-10-CM | POA: Diagnosis not present

## 2021-11-28 DIAGNOSIS — F419 Anxiety disorder, unspecified: Secondary | ICD-10-CM | POA: Diagnosis not present

## 2021-11-30 DIAGNOSIS — F419 Anxiety disorder, unspecified: Secondary | ICD-10-CM | POA: Diagnosis not present

## 2021-12-07 DIAGNOSIS — F322 Major depressive disorder, single episode, severe without psychotic features: Secondary | ICD-10-CM | POA: Diagnosis not present

## 2021-12-14 DIAGNOSIS — F419 Anxiety disorder, unspecified: Secondary | ICD-10-CM | POA: Diagnosis not present

## 2021-12-21 DIAGNOSIS — F322 Major depressive disorder, single episode, severe without psychotic features: Secondary | ICD-10-CM | POA: Diagnosis not present

## 2021-12-25 DIAGNOSIS — H5213 Myopia, bilateral: Secondary | ICD-10-CM | POA: Diagnosis not present

## 2021-12-26 DIAGNOSIS — F419 Anxiety disorder, unspecified: Secondary | ICD-10-CM | POA: Diagnosis not present

## 2021-12-26 DIAGNOSIS — Z419 Encounter for procedure for purposes other than remedying health state, unspecified: Secondary | ICD-10-CM | POA: Diagnosis not present

## 2021-12-28 DIAGNOSIS — F419 Anxiety disorder, unspecified: Secondary | ICD-10-CM | POA: Diagnosis not present

## 2022-01-07 ENCOUNTER — Ambulatory Visit: Payer: Medicaid Other

## 2022-01-14 DIAGNOSIS — F322 Major depressive disorder, single episode, severe without psychotic features: Secondary | ICD-10-CM | POA: Diagnosis not present

## 2022-01-15 ENCOUNTER — Encounter: Payer: Medicaid Other | Admitting: Nurse Practitioner

## 2022-01-16 DIAGNOSIS — H5213 Myopia, bilateral: Secondary | ICD-10-CM | POA: Diagnosis not present

## 2022-01-24 DIAGNOSIS — F322 Major depressive disorder, single episode, severe without psychotic features: Secondary | ICD-10-CM | POA: Diagnosis not present

## 2022-01-26 DIAGNOSIS — Z419 Encounter for procedure for purposes other than remedying health state, unspecified: Secondary | ICD-10-CM | POA: Diagnosis not present

## 2022-01-30 DIAGNOSIS — F322 Major depressive disorder, single episode, severe without psychotic features: Secondary | ICD-10-CM | POA: Diagnosis not present

## 2022-02-06 DIAGNOSIS — F419 Anxiety disorder, unspecified: Secondary | ICD-10-CM | POA: Diagnosis not present

## 2022-02-15 DIAGNOSIS — F322 Major depressive disorder, single episode, severe without psychotic features: Secondary | ICD-10-CM | POA: Diagnosis not present

## 2022-02-22 DIAGNOSIS — F322 Major depressive disorder, single episode, severe without psychotic features: Secondary | ICD-10-CM | POA: Diagnosis not present

## 2022-02-25 DIAGNOSIS — Z419 Encounter for procedure for purposes other than remedying health state, unspecified: Secondary | ICD-10-CM | POA: Diagnosis not present

## 2022-03-01 DIAGNOSIS — F322 Major depressive disorder, single episode, severe without psychotic features: Secondary | ICD-10-CM | POA: Diagnosis not present

## 2022-03-08 DIAGNOSIS — F322 Major depressive disorder, single episode, severe without psychotic features: Secondary | ICD-10-CM | POA: Diagnosis not present

## 2022-03-15 DIAGNOSIS — F322 Major depressive disorder, single episode, severe without psychotic features: Secondary | ICD-10-CM | POA: Diagnosis not present

## 2022-03-18 DIAGNOSIS — F322 Major depressive disorder, single episode, severe without psychotic features: Secondary | ICD-10-CM | POA: Diagnosis not present

## 2022-03-20 DIAGNOSIS — N390 Urinary tract infection, site not specified: Secondary | ICD-10-CM | POA: Diagnosis not present

## 2022-03-20 DIAGNOSIS — R300 Dysuria: Secondary | ICD-10-CM | POA: Diagnosis not present

## 2022-03-22 DIAGNOSIS — F322 Major depressive disorder, single episode, severe without psychotic features: Secondary | ICD-10-CM | POA: Diagnosis not present

## 2022-03-28 DIAGNOSIS — Z419 Encounter for procedure for purposes other than remedying health state, unspecified: Secondary | ICD-10-CM | POA: Diagnosis not present

## 2022-03-29 DIAGNOSIS — F322 Major depressive disorder, single episode, severe without psychotic features: Secondary | ICD-10-CM | POA: Diagnosis not present

## 2022-04-11 DIAGNOSIS — K000 Anodontia: Secondary | ICD-10-CM | POA: Diagnosis not present

## 2022-04-16 DIAGNOSIS — F419 Anxiety disorder, unspecified: Secondary | ICD-10-CM | POA: Diagnosis not present

## 2022-04-19 DIAGNOSIS — F322 Major depressive disorder, single episode, severe without psychotic features: Secondary | ICD-10-CM | POA: Diagnosis not present

## 2022-04-27 DIAGNOSIS — Z419 Encounter for procedure for purposes other than remedying health state, unspecified: Secondary | ICD-10-CM | POA: Diagnosis not present

## 2022-05-13 ENCOUNTER — Other Ambulatory Visit: Payer: Self-pay

## 2022-05-13 ENCOUNTER — Emergency Department: Payer: Medicaid Other

## 2022-05-13 ENCOUNTER — Emergency Department
Admission: EM | Admit: 2022-05-13 | Discharge: 2022-05-13 | Disposition: A | Discharge status: Home / Self Care | Payer: Medicaid Other | Attending: Emergency Medicine | Admitting: Emergency Medicine

## 2022-05-13 DIAGNOSIS — R55 Syncope and collapse: Secondary | ICD-10-CM | POA: Insufficient documentation

## 2022-05-13 DIAGNOSIS — R519 Headache, unspecified: Secondary | ICD-10-CM | POA: Insufficient documentation

## 2022-05-13 DIAGNOSIS — R52 Pain, unspecified: Secondary | ICD-10-CM | POA: Diagnosis not present

## 2022-05-13 DIAGNOSIS — S0003XA Contusion of scalp, initial encounter: Secondary | ICD-10-CM | POA: Diagnosis not present

## 2022-05-13 DIAGNOSIS — S0990XA Unspecified injury of head, initial encounter: Secondary | ICD-10-CM | POA: Diagnosis not present

## 2022-05-13 DIAGNOSIS — Z743 Need for continuous supervision: Secondary | ICD-10-CM | POA: Diagnosis not present

## 2022-05-13 DIAGNOSIS — G4489 Other headache syndrome: Secondary | ICD-10-CM | POA: Diagnosis not present

## 2022-05-13 DIAGNOSIS — T7840XA Allergy, unspecified, initial encounter: Secondary | ICD-10-CM | POA: Diagnosis not present

## 2022-05-13 DIAGNOSIS — R4182 Altered mental status, unspecified: Secondary | ICD-10-CM | POA: Diagnosis not present

## 2022-05-13 LAB — POC URINE PREG, ED: Preg Test, Ur: NEGATIVE

## 2022-05-13 LAB — URINALYSIS, ROUTINE W REFLEX MICROSCOPIC
Bilirubin Urine: NEGATIVE
Glucose, UA: NEGATIVE mg/dL
Ketones, ur: NEGATIVE mg/dL
Leukocytes,Ua: NEGATIVE
Nitrite: NEGATIVE
Protein, ur: NEGATIVE mg/dL
Specific Gravity, Urine: 1.009 (ref 1.005–1.030)
pH: 5 (ref 5.0–8.0)

## 2022-05-13 LAB — TROPONIN I (HIGH SENSITIVITY): Troponin I (High Sensitivity): 4 ng/L (ref <18)

## 2022-05-13 LAB — BASIC METABOLIC PANEL
Anion gap: 9 (ref 5–15)
BUN: 8 mg/dL (ref 6–20)
CO2: 20 mmol/L — ABNORMAL LOW (ref 22–32)
Calcium: 8.5 mg/dL — ABNORMAL LOW (ref 8.9–10.3)
Chloride: 111 mmol/L (ref 98–111)
Creatinine, Ser: 0.79 mg/dL (ref 0.44–1.00)
GFR, Estimated: >60 mL/min (ref >60)
Glucose, Bld: 82 mg/dL (ref 70–99)
Potassium: 3.6 mmol/L (ref 3.5–5.1)
Sodium: 140 mmol/L (ref 135–145)

## 2022-05-13 LAB — CBC
HCT: 38.7 % (ref 36.0–46.0)
Hemoglobin: 13 g/dL (ref 12.0–15.0)
MCH: 30.7 pg (ref 26.0–34.0)
MCHC: 33.6 g/dL (ref 30.0–36.0)
MCV: 91.5 fL (ref 80.0–100.0)
Platelets: 264 10*3/uL (ref 150–400)
RBC: 4.23 MIL/uL (ref 3.87–5.11)
RDW: 12.3 % (ref 11.5–15.5)
WBC: 10.5 10*3/uL (ref 4.0–10.5)
nRBC: 0 % (ref 0.0–0.2)

## 2022-05-13 NOTE — ED Notes (Signed)
Pt has red area to right side of forehead.  Pt reports a headache   iv in place.  Pt denies neck or back pain.  Chelsea Parks

## 2022-05-13 NOTE — ED Provider Notes (Signed)
[facility] Provider Note  Patient Contact: 7:48 PM (approximate)   History   Near Syncope   HPI  Chelsea Parks is a 26 y.o. female who presents the emergency department for evaluation of possible syncope versus seizure activity.  Patient reported that she did drink more caffeine and alcohol than typical yesterday, had not drink much other fluid.  She was outside today, started feeling hot, like a vertigo feeling with dizziness.  Patient states that she has no history of seizure disorder but states that when she was unconscious family member said that she had some jerking of her extremities.  No bowel or bladder incontinence.  Again no seizure-like activity in her past.  Patient had no postictal period after regaining consciousness.  She did have a headache after she hit the occipital region of her skull on the ground when she lost consciousness.  Total time of unconsciousness was somewhere between a minute and a half and 2 minutes.  Patient with no recent illnesses.  No other complaints to include unilateral weakness, slurred speech, chest pain, shortness of breath, GI symptoms, urinary symptoms.     Physical Exam   Triage Vital Signs: ED Triage Vitals  Enc Vitals Group     BP 05/13/22 1558 114/80     Pulse Rate 05/13/22 1558 89     Resp 05/13/22 1558 20     Temp 05/13/22 1558 98 F (36.7 C)     Temp Source 05/13/22 1558 Oral     SpO2 05/13/22 1558 100 %     Weight --      Height --      Head Circumference --      Peak Flow --      Pain Score 05/13/22 1539 5     Pain Loc --      Pain Edu? --      Excl. in GC? --     Most recent vital signs: Vitals:   05/13/22 1558  BP: 114/80  Pulse: 89  Resp: 20  Temp: 98 F (36.7 C)  SpO2: 100%     General: Alert and in no acute distress. Eyes:  PERRL. EOMI. Head: Occipital hematoma noted right side.  No abrasion or laceration.  No battle signs, raccoon eyes, serosanguineous fluid drainage from the  ears or nares.  Neck: No stridor. No cervical spine tenderness to palpation.  Cardiovascular:  Good peripheral perfusion Respiratory: Normal respiratory effort without tachypnea or retractions. Lungs CTAB. Good air entry to the bases with no decreased or absent breath sounds Musculoskeletal: Full range of motion to all extremities.  Neurologic:  No gross focal neurologic deficits are appreciated.  Cranial nerves II through XII grossly intact.  Negative Romberg's and pronator drift. Skin:   No rash noted Other:   ED Results / Procedures / Treatments   Labs (all labs ordered are listed, but only abnormal results are displayed) Labs Reviewed  BASIC METABOLIC PANEL - Abnormal; Notable for the following components:      Result Value   CO2 20 (*)    Calcium 8.5 (*)    All other components within normal limits  URINALYSIS, ROUTINE W REFLEX MICROSCOPIC - Abnormal; Notable for the following components:   Color, Urine STRAW (*)    APPearance CLEAR (*)    Hgb urine dipstick MODERATE (*)    Bacteria, UA RARE (*)    All other components within normal limits  CBC  POC URINE PREG, ED  CBG MONITORING,  ED  TROPONIN I (HIGH SENSITIVITY)     EKG  ED ECG REPORT I, Delorise Royals Clyda Smyth,  personally viewed and interpreted this ECG.   Date: 05/13/2022  EKG Time: 1603 hrs.  Rate: 83 bpm  Rhythm: there are no previous tracings available for comparison, normal sinus rhythm  Axis: Normal axis  Intervals:none  ST&T Change: No ST elevation or depression noted  Normal sinus rhythm.  No STEMI.  No previous EKGs for comparison     RADIOLOGY  I personally viewed, evaluated, and interpreted these images as part of my medical decision making, as well as reviewing the written report by the radiologist.  ED Provider Interpretation: CT scan revealed hematoma in the right occipital region correlating with physical exam.  No underlying skull fracture or intracranial hemorrhage.  CT Head Wo  Contrast  Result Date: 05/13/2022 CLINICAL DATA:  Head trauma, abnormal mental status. Near-syncope fell and hit head on the ground. EXAM: CT HEAD WITHOUT CONTRAST TECHNIQUE: Contiguous axial images were obtained from the base of the skull through the vertex without intravenous contrast. RADIATION DOSE REDUCTION: This exam was performed according to the departmental dose-optimization program which includes automated exposure control, adjustment of the mA and/or kV according to patient size and/or use of iterative reconstruction technique. COMPARISON:  None Available. FINDINGS: Brain: No evidence of acute infarction, hemorrhage, hydrocephalus, extra-axial collection or mass lesion/mass effect. Vascular: No hyperdense vessel or unexpected calcification. Skull: Right occipital scalp hematoma without evidence of calvarial fracture. Sinuses/Orbits: No acute finding. Other: None. IMPRESSION: 1. No acute intracranial abnormality. 2. Right occipital scalp hematoma without evidence of calvarial fracture. Electronically Signed   By: Larose Hires D.O.   On: 05/13/2022 16:24    PROCEDURES:  Critical Care performed: No  Procedures   MEDICATIONS ORDERED IN ED: Medications - No data to display   IMPRESSION / MDM / ASSESSMENT AND PLAN / ED COURSE  I reviewed the triage vital signs and the nursing notes.                              Differential diagnosis includes, but is not limited to, syncope, seizure, cardiac event, arrhythmi  Patient's presentation is most consistent with acute presentation with potential threat to life or bodily function.   Patient's diagnosis is consistent with syncope and collapse.  Patient presents to to the emergency department after losing consciousness this afternoon.  There was some concern initially whether the patient may have truly just lost consciousness versus had a seizure.  No history of seizures.  Reportedly patient started to feel lightheaded, dizzy, and not drink much  fluids other than alcohol or caffeine yesterday.  Patient lost consciousness and had some general shaking.  Patient did not have a bowel or bladder incontinence, no postictal state.  Patient is neurologically intact on exam.  Labs, EKG, and imaging of the head are reassuring without acute abnormality such as intracranial hemorrhage or mass.  Again no bowel or bladder incontinence, history of seizures or postictal period patient was noted to be hypotensive on EMS arrival and was given 2 L of fluid and is maintaining blood pressure in the 110s over 80s.  With reassuring work-up I feel the patient is stable.  Even if this was a for seizure activity I do not recommend starting antiepileptic medications at this time.  I will have the patient follow-up with neurology however.  Return precautions discussed with the patient and her husband.Marland Kitchen  Patient is given ED precautions to return to the ED for any worsening or new symptoms.        FINAL CLINICAL IMPRESSION(S) / ED DIAGNOSES   Final diagnoses:  Syncope and collapse     Rx / DC Orders   ED Discharge Orders     None        Note:  This document was prepared using Dragon voice recognition software and may include unintentional dictation errors.   Lanette Hampshire 05/13/22 2025    Georga Hacking, MD 05/13/22 [phone]

## 2022-05-13 NOTE — ED Provider Triage Note (Signed)
  Emergency Medicine Provider Triage Evaluation Note  Chelsea Parks , a 26 y.o.female,  was evaluated in triage.  Pt complains of syncope/reported seizure.  She states that she fell and hit her head on the ground.  Mother reportedly witnessed the event.  She presents via EMS.  She is given 2 bags of IV fluids in route because her blood pressure was initially 70/30.  She is currently not feeling any pain at this time.  Denies any history of seizures.   Review of Systems  Positive: Syncope/seizure Negative: Denies fever, chest pain, vomiting  Physical Exam   Vitals:   05/13/22 1558  BP: 114/80  Pulse: 89  Resp: 20  Temp: 98 F (36.7 C)  SpO2: 100%   Gen:   Awake, no distress   Resp:  Normal effort  MSK:   Moves extremities without difficulty  Other:    Medical Decision Making  Given the patient's initial medical screening exam, the following diagnostic evaluation has been ordered. The patient will be placed in the appropriate treatment space, once one is available, to complete the evaluation and treatment. I have discussed the plan of care with the patient and I have advised the patient that an ED physician or mid-level practitioner will reevaluate their condition after the test results have been received, as the results may give them additional insight into the type of treatment they may need.    Diagnostics: Labs, head CT, EKG  Treatments: none immediately   Varney Daily, [place] 05/13/22 1706

## 2022-05-13 NOTE — ED Triage Notes (Signed)
Pt comes via EMS with c/o near syncopal. Pt states she fell and hit head on ground. Pt BP was 70/30 and pt given 2 bags fluids. BP improved to 114/60 18 lac [id number]-82

## 2022-05-14 ENCOUNTER — Telehealth: Payer: Self-pay

## 2022-05-14 NOTE — Telephone Encounter (Signed)
Transition Care Management Unsuccessful Follow-up Telephone Call  Date of discharge and from where:  05/13/2022 from [facility]  Attempts:  1st Attempt  Reason for unsuccessful TCM follow-up call:  Left voice message

## 2022-05-15 DIAGNOSIS — F419 Anxiety disorder, unspecified: Secondary | ICD-10-CM | POA: Diagnosis not present

## 2022-05-15 NOTE — Telephone Encounter (Signed)
Transition Care Management Unsuccessful Follow-up Telephone Call  Date of discharge and from where:  05/13/2022 from [facility]  Attempts:  2nd Attempt  Reason for unsuccessful TCM follow-up call:  Left voice message

## 2022-05-16 DIAGNOSIS — R569 Unspecified convulsions: Secondary | ICD-10-CM | POA: Diagnosis not present

## 2022-05-16 DIAGNOSIS — Z7689 Persons encountering health services in other specified circumstances: Secondary | ICD-10-CM | POA: Diagnosis not present

## 2022-05-16 DIAGNOSIS — R55 Syncope and collapse: Secondary | ICD-10-CM | POA: Diagnosis not present

## 2022-05-17 DIAGNOSIS — F322 Major depressive disorder, single episode, severe without psychotic features: Secondary | ICD-10-CM | POA: Diagnosis not present

## 2022-05-22 NOTE — Telephone Encounter (Signed)
Transition Care Management Follow-up Telephone Call Date of discharge and from where: 05/13/2022 from [facility] How have you been since you were released from the hospital? Patient stated that she is feeling much better and did not have any questions or concerns at this time.  Any questions or concerns? No  Items Reviewed: Did the pt receive and understand the discharge instructions provided? Yes  Medications obtained and verified? Yes  Other? No  Any new allergies since your discharge? No  Dietary orders reviewed? No Do you have support at home? Yes   Functional Questionnaire: (I = Independent and D = Dependent) ADLs: I  Bathing/Dressing- I  Meal Prep- I  Eating- I  Maintaining continence- I  Transferring/Ambulation- I  Managing Meds- I   Follow up appointments reviewed:  PCP Hospital f/u appt confirmed? No  Specialist Hospital f/u appt confirmed? Yes  Patient has already seen neurology and has an upcoming appt with cardiology.  Are transportation arrangements needed?  No  If their condition worsens, is the pt aware to call PCP or go to the Emergency Dept.? Yes Was the patient provided with contact information for the PCP's office or ED? Yes Was to pt encouraged to call back with questions or concerns? Yes

## 2022-05-24 DIAGNOSIS — F322 Major depressive disorder, single episode, severe without psychotic features: Secondary | ICD-10-CM | POA: Diagnosis not present

## 2022-05-28 DIAGNOSIS — Z419 Encounter for procedure for purposes other than remedying health state, unspecified: Secondary | ICD-10-CM | POA: Diagnosis not present

## 2022-05-30 DIAGNOSIS — F322 Major depressive disorder, single episode, severe without psychotic features: Secondary | ICD-10-CM | POA: Diagnosis not present

## 2022-06-06 DIAGNOSIS — F322 Major depressive disorder, single episode, severe without psychotic features: Secondary | ICD-10-CM | POA: Diagnosis not present

## 2022-06-07 DIAGNOSIS — F322 Major depressive disorder, single episode, severe without psychotic features: Secondary | ICD-10-CM | POA: Diagnosis not present

## 2022-06-12 DIAGNOSIS — F322 Major depressive disorder, single episode, severe without psychotic features: Secondary | ICD-10-CM | POA: Diagnosis not present

## 2022-06-16 DIAGNOSIS — R569 Unspecified convulsions: Secondary | ICD-10-CM | POA: Diagnosis not present

## 2022-06-18 DIAGNOSIS — R55 Syncope and collapse: Secondary | ICD-10-CM | POA: Diagnosis not present

## 2022-06-18 DIAGNOSIS — R42 Dizziness and giddiness: Secondary | ICD-10-CM | POA: Diagnosis not present

## 2022-06-20 DIAGNOSIS — F419 Anxiety disorder, unspecified: Secondary | ICD-10-CM | POA: Diagnosis not present

## 2022-06-27 DIAGNOSIS — F322 Major depressive disorder, single episode, severe without psychotic features: Secondary | ICD-10-CM | POA: Diagnosis not present

## 2022-06-28 DIAGNOSIS — Z419 Encounter for procedure for purposes other than remedying health state, unspecified: Secondary | ICD-10-CM | POA: Diagnosis not present

## 2022-07-04 DIAGNOSIS — F419 Anxiety disorder, unspecified: Secondary | ICD-10-CM | POA: Diagnosis not present

## 2022-07-09 DIAGNOSIS — R55 Syncope and collapse: Secondary | ICD-10-CM | POA: Diagnosis not present

## 2022-07-11 DIAGNOSIS — R569 Unspecified convulsions: Secondary | ICD-10-CM | POA: Diagnosis not present

## 2022-07-18 DIAGNOSIS — R55 Syncope and collapse: Secondary | ICD-10-CM | POA: Diagnosis not present

## 2022-07-23 DIAGNOSIS — R55 Syncope and collapse: Secondary | ICD-10-CM | POA: Diagnosis not present

## 2022-07-23 DIAGNOSIS — R569 Unspecified convulsions: Secondary | ICD-10-CM | POA: Diagnosis not present

## 2022-07-28 DIAGNOSIS — Z419 Encounter for procedure for purposes other than remedying health state, unspecified: Secondary | ICD-10-CM | POA: Diagnosis not present

## 2022-08-28 DIAGNOSIS — Z419 Encounter for procedure for purposes other than remedying health state, unspecified: Secondary | ICD-10-CM | POA: Diagnosis not present

## 2022-08-29 DIAGNOSIS — F419 Anxiety disorder, unspecified: Secondary | ICD-10-CM | POA: Diagnosis not present

## 2022-09-01 DIAGNOSIS — R569 Unspecified convulsions: Secondary | ICD-10-CM | POA: Insufficient documentation

## 2022-09-01 NOTE — Patient Instructions (Signed)

## 2022-09-03 ENCOUNTER — Encounter: Payer: Self-pay | Admitting: Nurse Practitioner

## 2022-09-03 ENCOUNTER — Ambulatory Visit (INDEPENDENT_AMBULATORY_CARE_PROVIDER_SITE_OTHER): Payer: Medicaid Other | Admitting: Nurse Practitioner

## 2022-09-03 VITALS — BP 92/61 | HR 97 | Temp 98.5°F | Ht 63.0 in | Wt 154.8 lb

## 2022-09-03 DIAGNOSIS — R051 Acute cough: Secondary | ICD-10-CM

## 2022-09-03 DIAGNOSIS — R059 Cough, unspecified: Secondary | ICD-10-CM | POA: Insufficient documentation

## 2022-09-03 MED ORDER — PREDNISONE 20 MG PO TABS: 40.0000 mg | ORAL_TABLET | Freq: Every day | ORAL | 0 refills | Status: AC

## 2022-09-03 MED ORDER — AZITHROMYCIN 250 MG PO TABS: ORAL_TABLET | ORAL | 0 refills | Status: AC

## 2022-09-03 MED ORDER — ALBUTEROL SULFATE HFA 108 (90 BASE) MCG/ACT IN AERS
2.0000 | INHALATION_SPRAY | Freq: Four times a day (QID) | RESPIRATORY_TRACT | 2 refills | Status: DC | PRN
Start: 2022-09-03 — End: 2024-02-05

## 2022-09-03 NOTE — Progress Notes (Signed)
Acute Office Visit  Subjective:     Patient ID: Chelsea Parks, female    DOB: 1996/04/09, 26 y.o.   MRN: [id number]  Chief Complaint  Patient presents with   Cough    Patient says she has been had the coughing for the past three weeks. Patient says everyone around her has been the doctor for cold/virus. Patient says she is having difficulty breathing due to coughing so much and says she has been using her inhaler more lately. Patient denies having any other symptoms.     Has been feeling ill for 3 weeks, everyone in her family has been sick.  Presenting both as cough and sinus issues.  Having coughing spells that cause difficulty breathing.  Tested for Covid at home and was negative.  IS vaccinated x 2 for Covid.  Cough This is a new problem. The current episode started 1 to 4 weeks ago. The problem has been unchanged. The problem occurs every few minutes. The cough is Productive of sputum. Associated symptoms include headaches and shortness of breath. Pertinent negatives include no chest pain, chills, ear congestion, ear pain, fever, heartburn, myalgias, nasal congestion, postnasal drip, rhinorrhea, sore throat, sweats or wheezing. Nothing aggravates the symptoms. She has tried rest, OTC cough suppressant and body position changes for the symptoms. The treatment provided moderate relief. There is no history of asthma, bronchitis, COPD, emphysema or pneumonia.   Patient is in today for cough.  Review of Systems  Constitutional:  Negative for chills, diaphoresis and fever.  HENT:  Negative for congestion, ear discharge, ear pain, hearing loss, nosebleeds, postnasal drip, rhinorrhea, sinus pain and sore throat.   Respiratory:  Positive for cough, sputum production and shortness of breath. Negative for wheezing.   Cardiovascular:  Negative for chest pain, palpitations, orthopnea and leg swelling.  Gastrointestinal:  Negative for heartburn, nausea and vomiting.  Musculoskeletal:  Negative  for myalgias.  Neurological:  Positive for headaches.  Psychiatric/Behavioral: Negative.          Objective:    BP 92/61   Pulse 97   Temp 98.5 F (36.9 C) (Oral)   Ht 5\' 3"  (1.6 m)   Wt 154 lb 12.8 oz (70.2 kg)   SpO2 97%   BMI 27.42 kg/m  BP Readings from Last 3 Encounters:  09/03/22 92/61  05/13/22 113/72  09/26/21 110/87   Wt Readings from Last 3 Encounters:  09/03/22 154 lb 12.8 oz (70.2 kg)  09/26/21 126 lb (57.2 kg)  08/24/21 120 lb (54.4 kg)   Physical Exam Vitals and nursing note reviewed.  Constitutional:      General: She is awake. She is not in acute distress.    Appearance: She is well-developed and well-groomed. She is not ill-appearing.  HENT:     Head: Normocephalic.     Right Ear: Hearing, ear canal and external ear normal. A middle ear effusion is present. Tympanic membrane is not perforated or erythematous.     Left Ear: Hearing, ear canal and external ear normal. A middle ear effusion is present. Tympanic membrane is not perforated or erythematous.     Nose: Rhinorrhea present. Rhinorrhea is clear.     Right Sinus: No maxillary sinus tenderness or frontal sinus tenderness.     Left Sinus: No maxillary sinus tenderness or frontal sinus tenderness.     Mouth/Throat:     Mouth: Mucous membranes are moist.     Pharynx: Posterior oropharyngeal erythema (mild with cobblestone appearance) present. No pharyngeal swelling  or oropharyngeal exudate.  Eyes:     General: Lids are normal.        Right eye: No discharge.        Left eye: No discharge.     Conjunctiva/sclera: Conjunctivae normal.     Pupils: Pupils are equal, round, and reactive to light.  Neck:     Thyroid: No thyromegaly.     Vascular: No carotid bruit.  Cardiovascular:     Rate and Rhythm: Normal rate and regular rhythm.     Heart sounds: Normal heart sounds. No murmur heard.    No gallop.  Pulmonary:     Effort: Pulmonary effort is normal. No accessory muscle usage or respiratory  distress.     Breath sounds: Wheezing present.     Comments: Fine wheezes noted on expiration throughout. Abdominal:     General: Bowel sounds are normal.     Palpations: Abdomen is soft.  Musculoskeletal:     Cervical back: Normal range of motion and neck supple.     Right lower leg: No edema.     Left lower leg: No edema.  Lymphadenopathy:     Head:     Right side of head: No submental, submandibular, tonsillar, preauricular or posterior auricular adenopathy.     Left side of head: No submental, submandibular, tonsillar, preauricular or posterior auricular adenopathy.  Skin:    General: Skin is warm and dry.  Neurological:     Mental Status: She is alert and oriented to person, place, and time.  Psychiatric:        Attention and Perception: Attention normal.        Mood and Affect: Mood normal.        Speech: Speech normal.        Behavior: Behavior normal. Behavior is cooperative.        Thought Content: Thought content normal.    No results found for any visits on 09/03/22.     Assessment & Plan:   Problem List Items Addressed This Visit       Other   Cough - Primary    Acute and ongoing post URI for 3 weeks.  Covid negative at home.  Will start Prednisone 40 MG daily for 5 days and Zpack + refill Albuterol inhaler as her inhaler at home is expired.  Recommend: - Increased rest - Increasing Fluids - Acetaminophen as needed for fever/pain.  - Salt water gargling, chloraseptic spray and throat lozenges - Mucinex.  - Humidifying the air.  Return to office if worsening or ongoing.       Meds ordered this encounter  Medications   azithromycin (ZITHROMAX) 250 MG tablet    Sig: Take 2 tablets on day 1, then 1 tablet daily on days 2 through 5    Dispense:  6 tablet    Refill:  0   predniSONE (DELTASONE) 20 MG tablet    Sig: Take 2 tablets (40 mg total) by mouth daily with breakfast for 5 days.    Dispense:  10 tablet    Refill:  0   albuterol (VENTOLIN HFA) 108  (90 Base) MCG/ACT inhaler    Sig: Inhale 2 puffs into the lungs every 6 (six) hours as needed for wheezing or shortness of breath.    Dispense:  18 g    Refill:  2    Return if symptoms worsen or fail to improve.  Venita Lick, NP

## 2022-09-03 NOTE — Assessment & Plan Note (Signed)
Acute and ongoing post URI for 3 weeks.  Covid negative at home.  Will start Prednisone 40 MG daily for 5 days and Zpack + refill Albuterol inhaler as her inhaler at home is expired.  Recommend: - Increased rest - Increasing Fluids - Acetaminophen as needed for fever/pain.  - Salt water gargling, chloraseptic spray and throat lozenges - Mucinex.  - Humidifying the air.  Return to office if worsening or ongoing.

## 2022-09-12 DIAGNOSIS — F322 Major depressive disorder, single episode, severe without psychotic features: Secondary | ICD-10-CM | POA: Diagnosis not present

## 2022-09-22 NOTE — Patient Instructions (Signed)

## 2022-09-23 ENCOUNTER — Ambulatory Visit
Admission: RE | Admit: 2022-09-23 | Discharge: 2022-09-23 | Disposition: A | Discharge status: Home / Self Care | Payer: Medicaid Other | Source: Home / Self Care | Attending: *Deleted | Admitting: *Deleted

## 2022-09-23 ENCOUNTER — Ambulatory Visit: Payer: Medicaid Other | Admitting: Nurse Practitioner

## 2022-09-23 ENCOUNTER — Encounter: Payer: Self-pay | Admitting: Nurse Practitioner

## 2022-09-23 ENCOUNTER — Ambulatory Visit
Admission: RE | Admit: 2022-09-23 | Discharge: 2022-09-23 | Disposition: A | Discharge status: Home / Self Care | Payer: Medicaid Other | Source: Ambulatory Visit | Attending: Nurse Practitioner | Admitting: Nurse Practitioner

## 2022-09-23 VITALS — BP 111/80 | HR 98 | Temp 98.4°F | Ht 62.99 in | Wt 149.6 lb

## 2022-09-23 DIAGNOSIS — R051 Acute cough: Secondary | ICD-10-CM

## 2022-09-23 DIAGNOSIS — R059 Cough, unspecified: Secondary | ICD-10-CM | POA: Diagnosis not present

## 2022-09-23 DIAGNOSIS — F322 Major depressive disorder, single episode, severe without psychotic features: Secondary | ICD-10-CM | POA: Diagnosis not present

## 2022-09-23 MED ORDER — PREDNISONE 20 MG PO TABS: 40.0000 mg | ORAL_TABLET | Freq: Every day | ORAL | 0 refills | Status: AC

## 2022-09-23 NOTE — Progress Notes (Signed)
BP 111/80   Pulse 98   Temp 98.4 F (36.9 C) (Oral)   Ht 5' 2.99" (1.6 m)   Wt 149 lb 9.6 oz (67.9 kg)   SpO2 98%   BMI 26.51 kg/m    Subjective:    Patient ID: Chelsea Parks, female    DOB: Jun 18, 1996, 26 y.o.   MRN: [id number]  HPI: Chelsea Parks is a 26 y.o. female  Chief Complaint  Patient presents with   Cough    Patient states she was prescribed steroids and did not take as prescribed and threw them away. Cough started a month ago   COUGH Seen for this on 09/03/22 and prescribed Prednisone and Zpack.  Did not take Prednisone, threw it away. Duration: weeks -- 4 weeks Circumstances of initial development of cough: URI Cough severity: moderate Cough description: non-productive Aggravating factors:  worse at night and worse in the AM Alleviating factors: mucinex Status:  fluctuating Treatments attempted: mucinex, abx Wheezing: occasional Shortness of breath:  occasional Chest pain: no Chest tightness: occasional Nasal congestion: yes Runny nose: no Postnasal drip: yes Frequent throat clearing or swallowing: yes Hemoptysis: no Fevers: no Night sweats: no Weight loss: no Heartburn: no Recent foreign travel: no Tuberculosis contacts: no   Relevant past medical, surgical, family and social history reviewed and updated as indicated. Interim medical history since our last visit reviewed. Allergies and medications reviewed and updated.  Review of Systems  Constitutional:  Positive for fatigue. Negative for activity change, appetite change, chills and fever.  Respiratory:  Positive for cough, shortness of breath and wheezing. Negative for chest tightness.   Cardiovascular: Negative.   Gastrointestinal: Negative.   Neurological: Negative.   Psychiatric/Behavioral: Negative.     Per HPI unless specifically indicated above     Objective:    BP 111/80   Pulse 98   Temp 98.4 F (36.9 C) (Oral)   Ht 5' 2.99" (1.6 m)   Wt 149 lb 9.6 oz (67.9 kg)   SpO2  98%   BMI 26.51 kg/m   Wt Readings from Last 3 Encounters:  09/23/22 149 lb 9.6 oz (67.9 kg)  09/03/22 154 lb 12.8 oz (70.2 kg)  09/26/21 126 lb (57.2 kg)    Physical Exam Vitals and nursing note reviewed.  Constitutional:      General: She is awake. She is not in acute distress.    Appearance: She is well-developed and well-groomed. She is not ill-appearing.  HENT:     Head: Normocephalic.     Right Ear: Hearing, ear canal and external ear normal. A middle ear effusion is present. Tympanic membrane is not perforated or erythematous.     Left Ear: Hearing, ear canal and external ear normal. A middle ear effusion is present. Tympanic membrane is not perforated or erythematous.     Nose: Rhinorrhea present. Rhinorrhea is clear.     Right Sinus: No maxillary sinus tenderness or frontal sinus tenderness.     Left Sinus: No maxillary sinus tenderness or frontal sinus tenderness.     Mouth/Throat:     Mouth: Mucous membranes are moist.     Pharynx: Posterior oropharyngeal erythema (mild with cobblestone appearance) present. No pharyngeal swelling or oropharyngeal exudate.  Eyes:     General: Lids are normal.        Right eye: No discharge.        Left eye: No discharge.     Conjunctiva/sclera: Conjunctivae normal.     Pupils: Pupils are equal,  round, and reactive to light.  Neck:     Thyroid: No thyromegaly.     Vascular: No carotid bruit.  Cardiovascular:     Rate and Rhythm: Normal rate and regular rhythm.     Heart sounds: Normal heart sounds. No murmur heard.    No gallop.  Pulmonary:     Effort: Pulmonary effort is normal. No accessory muscle usage or respiratory distress.     Breath sounds: No decreased breath sounds, wheezing or rhonchi.     Comments: No wheezes on exam today, continues to have intermittent dry cough present. Abdominal:     General: Bowel sounds are normal.     Palpations: Abdomen is soft.  Musculoskeletal:     Cervical back: Normal range of motion and  neck supple.     Right lower leg: No edema.     Left lower leg: No edema.  Lymphadenopathy:     Head:     Right side of head: No submental, submandibular, tonsillar, preauricular or posterior auricular adenopathy.     Left side of head: No submental, submandibular, tonsillar, preauricular or posterior auricular adenopathy.  Skin:    General: Skin is warm and dry.  Neurological:     Mental Status: She is alert and oriented to person, place, and time.  Psychiatric:        Attention and Perception: Attention normal.        Mood and Affect: Mood normal.        Speech: Speech normal.        Behavior: Behavior normal. Behavior is cooperative.        Thought Content: Thought content normal.     Results for orders placed or performed during the hospital encounter of 05/13/22  Basic metabolic panel  Result Value Ref Range   Sodium 140 135 - 145 mmol/L   Potassium 3.6 3.5 - 5.1 mmol/L   Chloride 111 98 - 111 mmol/L   CO2 20 (L) 22 - 32 mmol/L   Glucose, Bld 82 70 - 99 mg/dL   BUN 8 6 - 20 mg/dL   Creatinine, Ser 1.61 0.44 - 1.00 mg/dL   Calcium 8.5 (L) 8.9 - 10.3 mg/dL   GFR, Estimated >09 >60 mL/min   Anion gap 9 5 - 15  CBC  Result Value Ref Range   WBC 10.5 4.0 - 10.5 K/uL   RBC 4.23 3.87 - 5.11 MIL/uL   Hemoglobin 13.0 12.0 - 15.0 g/dL   HCT 45.4 09.8 - 11.9 %   MCV 91.5 80.0 - 100.0 fL   MCH 30.7 26.0 - 34.0 pg   MCHC 33.6 30.0 - 36.0 g/dL   RDW 14.7 82.9 - 56.2 %   Platelets 264 150 - 400 K/uL   nRBC 0.0 0.0 - 0.2 %  Urinalysis, Routine w reflex microscopic Urine, Clean Catch  Result Value Ref Range   Color, Urine STRAW (A) YELLOW   APPearance CLEAR (A) CLEAR   Specific Gravity, Urine 1.009 1.005 - 1.030   pH 5.0 5.0 - 8.0   Glucose, UA NEGATIVE NEGATIVE mg/dL   Hgb urine dipstick MODERATE (A) NEGATIVE   Bilirubin Urine NEGATIVE NEGATIVE   Ketones, ur NEGATIVE NEGATIVE mg/dL   Protein, ur NEGATIVE NEGATIVE mg/dL   Nitrite NEGATIVE NEGATIVE   Leukocytes,Ua NEGATIVE  NEGATIVE   RBC / HPF 0-5 0 - 5 RBC/hpf   WBC, UA 0-5 0 - 5 WBC/hpf   Bacteria, UA RARE (A) NONE SEEN   Squamous Epithelial /  LPF 6-10 0 - 5   Mucus PRESENT    Hyaline Casts, UA PRESENT   POC urine preg, ED  Result Value Ref Range   Preg Test, Ur NEGATIVE NEGATIVE  Troponin I (High Sensitivity)  Result Value Ref Range   Troponin I (High Sensitivity) 4 <18 ng/L      Assessment & Plan:   Problem List Items Addressed This Visit       Other   Cough - Primary    Acute and ongoing post URI for 4 weeks.  Sent in Prednisone 40 MG daily for 5 days recommend she take this for 5 days total to assist with inflammation + use Albuterol inhaler as needed Q6H.  Order for chest x-ray to further assess and directed on where to obtain this.  Recommend: - Increased rest - Increasing Fluids - Acetaminophen as needed for fever/pain.  - Salt water gargling, chloraseptic spray and throat lozenges - Mucinex.  - Humidifying the air.  Return to office if worsening or ongoing.      Relevant Orders   DG Chest 2 View     Follow up plan: Return if symptoms worsen or fail to improve.

## 2022-09-23 NOTE — Progress Notes (Signed)
Contacted via MyChart   Good evening Karman, your chest imaging returned and no acute findings, including no pneumonia, seen. Great news!!  Try the Prednisone as we discussed.:)

## 2022-09-23 NOTE — Assessment & Plan Note (Signed)
Acute and ongoing post URI for 4 weeks.  Sent in Prednisone 40 MG daily for 5 days recommend she take this for 5 days total to assist with inflammation + use Albuterol inhaler as needed Q6H.  Order for chest x-ray to further assess and directed on where to obtain this.  Recommend: - Increased rest - Increasing Fluids - Acetaminophen as needed for fever/pain.  - Salt water gargling, chloraseptic spray and throat lozenges - Mucinex.  - Humidifying the air.  Return to office if worsening or ongoing.

## 2022-09-27 DIAGNOSIS — Z419 Encounter for procedure for purposes other than remedying health state, unspecified: Secondary | ICD-10-CM | POA: Diagnosis not present

## 2022-10-16 DIAGNOSIS — F419 Anxiety disorder, unspecified: Secondary | ICD-10-CM | POA: Diagnosis not present

## 2022-10-17 DIAGNOSIS — R55 Syncope and collapse: Secondary | ICD-10-CM | POA: Diagnosis not present

## 2022-10-28 DIAGNOSIS — Z419 Encounter for procedure for purposes other than remedying health state, unspecified: Secondary | ICD-10-CM | POA: Diagnosis not present

## 2022-11-13 DIAGNOSIS — F419 Anxiety disorder, unspecified: Secondary | ICD-10-CM | POA: Diagnosis not present

## 2022-11-26 DIAGNOSIS — F322 Major depressive disorder, single episode, severe without psychotic features: Secondary | ICD-10-CM | POA: Diagnosis not present

## 2022-11-28 DIAGNOSIS — Z419 Encounter for procedure for purposes other than remedying health state, unspecified: Secondary | ICD-10-CM | POA: Diagnosis not present

## 2022-12-05 DIAGNOSIS — F322 Major depressive disorder, single episode, severe without psychotic features: Secondary | ICD-10-CM | POA: Diagnosis not present

## 2022-12-10 DIAGNOSIS — F419 Anxiety disorder, unspecified: Secondary | ICD-10-CM | POA: Diagnosis not present

## 2022-12-17 DIAGNOSIS — F322 Major depressive disorder, single episode, severe without psychotic features: Secondary | ICD-10-CM | POA: Diagnosis not present

## 2022-12-24 DIAGNOSIS — F322 Major depressive disorder, single episode, severe without psychotic features: Secondary | ICD-10-CM | POA: Diagnosis not present

## 2022-12-27 DIAGNOSIS — Z419 Encounter for procedure for purposes other than remedying health state, unspecified: Secondary | ICD-10-CM | POA: Diagnosis not present

## 2023-01-09 DIAGNOSIS — F419 Anxiety disorder, unspecified: Secondary | ICD-10-CM | POA: Diagnosis not present

## 2023-01-23 DIAGNOSIS — F419 Anxiety disorder, unspecified: Secondary | ICD-10-CM | POA: Diagnosis not present

## 2023-01-27 DIAGNOSIS — Z419 Encounter for procedure for purposes other than remedying health state, unspecified: Secondary | ICD-10-CM | POA: Diagnosis not present

## 2023-01-30 DIAGNOSIS — F322 Major depressive disorder, single episode, severe without psychotic features: Secondary | ICD-10-CM | POA: Diagnosis not present

## 2023-02-06 DIAGNOSIS — F419 Anxiety disorder, unspecified: Secondary | ICD-10-CM | POA: Diagnosis not present

## 2023-02-11 DIAGNOSIS — F419 Anxiety disorder, unspecified: Secondary | ICD-10-CM | POA: Diagnosis not present

## 2023-02-18 DIAGNOSIS — F4310 Post-traumatic stress disorder, unspecified: Secondary | ICD-10-CM | POA: Diagnosis not present

## 2023-02-25 DIAGNOSIS — F4310 Post-traumatic stress disorder, unspecified: Secondary | ICD-10-CM | POA: Diagnosis not present

## 2023-02-26 DIAGNOSIS — Z419 Encounter for procedure for purposes other than remedying health state, unspecified: Secondary | ICD-10-CM | POA: Diagnosis not present

## 2023-03-04 DIAGNOSIS — F4310 Post-traumatic stress disorder, unspecified: Secondary | ICD-10-CM | POA: Diagnosis not present

## 2023-03-11 DIAGNOSIS — F4310 Post-traumatic stress disorder, unspecified: Secondary | ICD-10-CM | POA: Diagnosis not present

## 2023-03-12 ENCOUNTER — Ambulatory Visit: Payer: Medicaid Other | Admitting: Obstetrics and Gynecology

## 2023-03-15 NOTE — Patient Instructions (Incomplete)
Managing Anxiety, Adult After being diagnosed with anxiety, you may be relieved to know why you have felt or behaved a certain way. You may also feel overwhelmed about the treatment ahead and what it will mean for your life. With care and support, you can manage your anxiety. How to manage lifestyle changes Understanding the difference between stress and anxiety Although stress can play a role in anxiety, it is not the same as anxiety. Stress is your body's reaction to life changes and events, both good and bad. Stress is often caused by something external, such as a deadline, test, or competition. It normally goes away after the event has ended and will last just a few hours. But, stress can be ongoing and can lead to more than just stress. Anxiety is caused by something internal, such as imagining a terrible outcome or worrying that something will go wrong that will greatly upset you. Anxiety often does not go away even after the event is over, and it can become a long-term (chronic) worry. Lowering stress and anxiety Talk with your health care provider or a counselor to learn more about lowering anxiety and stress. They may suggest tension-reduction techniques, such as: Music. Spend time creating or listening to music that you enjoy and that inspires you. Mindfulness-based meditation. Practice being aware of your normal breaths while not trying to control your breathing. It can be done while sitting or walking. Centering prayer. Focus on a word, phrase, or sacred image that means something to you and brings you peace. Deep breathing. Expand your stomach and inhale slowly through your nose. Hold your breath for 3-5 seconds. Then breathe out slowly, letting your stomach muscles relax. Self-talk. Learn to notice and spot thought patterns that lead to anxiety reactions. Change those patterns to thoughts that feel peaceful. Muscle relaxation. Take time to tense muscles and then relax them. Choose a  tension-reduction technique that fits your lifestyle and personality. These techniques take time and practice. Set aside 5-15 minutes a day to do them. Specialized therapists can offer counseling and training in these techniques. The training to help with anxiety may be covered by some insurance plans. Other things you can do to manage stress and anxiety include: Keeping a stress diary. This can help you learn what triggers your reaction and then learn ways to manage your response. Thinking about how you react to certain situations. You may not be able to control everything, but you can control your response. Making time for activities that help you relax and not feeling guilty about spending your time in this way. Doing visual imagery. This involves imagining or creating mental pictures to help you relax. Practicing yoga. Through yoga poses, you can lower tension and relax.  Medicines Medicines for anxiety include: Antidepressant medicines. These are usually prescribed for long-term daily control. Anti-anxiety medicines. These may be added in severe cases, especially when panic attacks occur. When used together, medicines, psychotherapy, and tension-reduction techniques may be the most effective treatment. Relationships Relationships can play a big part in helping you recover. Spend more time connecting with trusted friends and family members. Think about going to couples counseling if you have a partner, taking family education classes, or going to family therapy. Therapy can help you and others better understand your anxiety. How to recognize changes in your anxiety Everyone responds differently to treatment for anxiety. Recovery from anxiety happens when symptoms lessen and stop interfering with your daily life at home or work. This may mean that you   will start to: Have better concentration and focus. Worry will interfere less in your daily thinking. Sleep better. Be less irritable. Have more  energy. Have improved memory. Try to recognize when your condition is getting worse. Contact your provider if your symptoms interfere with home or work and you feel like your condition is not improving. Follow these instructions at home: Activity Exercise. Adults should: Exercise for at least 150 minutes each week. The exercise should increase your heart rate and make you sweat (moderate-intensity exercise). Do strengthening exercises at least twice a week. Get the right amount and quality of sleep. Most adults need 7-9 hours of sleep each night. Lifestyle  Eat a healthy diet that includes plenty of vegetables, fruits, whole grains, low-fat dairy products, and lean protein. Do not eat a lot of foods that are high in fats, added sugars, or salt (sodium). Make choices that simplify your life. Do not use any products that contain nicotine or tobacco. These products include cigarettes, chewing tobacco, and vaping devices, such as e-cigarettes. If you need help quitting, ask your provider. Avoid caffeine, alcohol, and certain over-the-counter cold medicines. These may make you feel worse. Ask your pharmacist which medicines to avoid. General instructions Take over-the-counter and prescription medicines only as told by your provider. Keep all follow-up visits. This is to make sure you are managing your anxiety well or if you need more support. Where to find support You can get help and support from: Self-help groups. Online and community organizations. A trusted spiritual leader. Couples counseling. Family education classes. Family therapy. Where to find more information You may find that joining a support group helps you deal with your anxiety. The following sources can help you find counselors or support groups near you: Mental Health America: mentalhealthamerica.net Anxiety and Depression Association of America (ADAA): adaa.org National Alliance on Mental Illness (NAMI): nami.org Contact  a health care provider if: You have a hard time staying focused or finishing tasks. You spend many hours a day feeling worried about everyday life. You are very tired because you cannot stop worrying. You start to have headaches or often feel tense. You have chronic nausea or diarrhea. Get help right away if: Your heart feels like it is racing. You have shortness of breath. You have thoughts of hurting yourself or others. Get help right away if you feel like you may hurt yourself or others, or have thoughts about taking your own life. Go to your nearest emergency room or: Call 911. Call the National Suicide Prevention Lifeline at 1-800-273-8255 or 988. This is open 24 hours a day. Text the Crisis Text Line at 741741. This information is not intended to replace advice given to you by your health care provider. Make sure you discuss any questions you have with your health care provider. Document Revised: 07/23/2022 Document Reviewed: 02/04/2021 Elsevier Patient Education  2023 Elsevier Inc.  

## 2023-03-17 ENCOUNTER — Encounter: Payer: Self-pay | Admitting: Nurse Practitioner

## 2023-03-17 ENCOUNTER — Ambulatory Visit (INDEPENDENT_AMBULATORY_CARE_PROVIDER_SITE_OTHER): Payer: Medicaid Other | Admitting: Nurse Practitioner

## 2023-03-17 VITALS — BP 114/84 | HR 93 | Temp 97.7°F | Ht 62.99 in | Wt 139.4 lb

## 2023-03-17 DIAGNOSIS — R569 Unspecified convulsions: Secondary | ICD-10-CM

## 2023-03-17 DIAGNOSIS — Z136 Encounter for screening for cardiovascular disorders: Secondary | ICD-10-CM

## 2023-03-17 DIAGNOSIS — E538 Deficiency of other specified B group vitamins: Secondary | ICD-10-CM

## 2023-03-17 DIAGNOSIS — M6289 Other specified disorders of muscle: Secondary | ICD-10-CM

## 2023-03-17 DIAGNOSIS — E559 Vitamin D deficiency, unspecified: Secondary | ICD-10-CM

## 2023-03-17 DIAGNOSIS — Z0000 Encounter for general adult medical examination without abnormal findings: Secondary | ICD-10-CM | POA: Diagnosis not present

## 2023-03-17 DIAGNOSIS — Z13220 Encounter for screening for lipoid disorders: Secondary | ICD-10-CM | POA: Diagnosis not present

## 2023-03-17 DIAGNOSIS — F4310 Post-traumatic stress disorder, unspecified: Secondary | ICD-10-CM

## 2023-03-17 DIAGNOSIS — Z1159 Encounter for screening for other viral diseases: Secondary | ICD-10-CM

## 2023-03-17 DIAGNOSIS — Z3009 Encounter for other general counseling and advice on contraception: Secondary | ICD-10-CM

## 2023-03-17 DIAGNOSIS — F319 Bipolar disorder, unspecified: Secondary | ICD-10-CM

## 2023-03-17 MED ORDER — FLUOXETINE HCL 20 MG PO TABS
20.0000 mg | ORAL_TABLET | Freq: Every day | ORAL | 4 refills | Status: DC
Start: 2023-03-17 — End: 2023-04-25

## 2023-03-17 NOTE — Progress Notes (Signed)
BP 114/84   Pulse 93   Temp 97.7 F (36.5 C) (Oral)   Ht 5' 2.99" (1.6 m)   Wt 139 lb 6.4 oz (63.2 kg)   SpO2 98%   BMI 24.70 kg/m    Subjective:    Patient ID: Chelsea Parks, female    DOB: July 20, 1996, 27 y.o.   MRN: [id number]  HPI: Chelsea Parks is a 27 y.o. female presenting on 03/17/2023 for comprehensive medical examination. Current medical complaints include:none  She currently lives with: children Menopausal Symptoms: no  Patient wishes to start  birth control.  She denies any history of DVT/PE, migraine with aura, cancer, blood clotting disorder or tobacco use (e cig use thought).  Instructed to start after her next menstrual period, or right away if appropriate.  Instructed as to importance of taking medication as directed, and encouraged to use condoms as well if at risk for STIs.  At this time she wishes to think about options.    PELVIC FLOOR ISSUES Has been having less tension.  Saw uro gyn last 11/19/21.  No spasms recently.  Did have past issues with tailbone -- would like to see chiropractor.   Dysuria: no Urinary frequency: no Urgency: no Small volume voids: no Symptom severity: no Urinary incontinence: no Foul odor: no Hematuria: no Abdominal pain: no Back pain: no Suprapubic pain/pressure: no Flank pain: no Fever:  no Status: better Previous urinary tract infection: yes Sexual activity: monogamous  PTSD Does not currently have psychiatrist, has evaluation coming up in July with psychology.  Previously followed with psychiatry, who had her on multiple medications.  Lamictal and Prozac seemed to help her the most.  Currently is attending therapy and opening doors, which she feels is why anxiety is increased at this time.  Does not have Bipolar, was diagnosed with this and then it was taken away.  States that last June had syncopal episode -- was seen by cardiology and neurology at the time with overall reassuring testing.   Mood status:  stable Satisfied with current treatment?: yes Symptom severity: moderate  Duration of current treatment : chronic Side effects: no Medication compliance: good compliance Psychotherapy/counseling: yes current Previous psychiatric medications: multiple medications in past Depressed mood: yes Anxious mood: yes Anhedonia: no Significant weight loss or gain: no Insomnia: yes hard to fall asleep Fatigue: yes Feelings of worthlessness or guilt: yes Impaired concentration/indecisiveness: no Suicidal ideations: no Hopelessness: yes some days Crying spells: yes x 1 episode that was warranted    03/17/2023    3:18 PM 09/23/2022    2:42 PM 07/18/2021    8:39 AM 01/12/2021    3:53 PM 12/20/2019    1:36 PM  Depression screen PHQ 2/9  Decreased Interest 1 3 0 2 0  Down, Depressed, Hopeless 1 3 0 2 0  PHQ - 2 Score 2 6 0 4 0  Altered sleeping 3 3 0 3 0  Tired, decreased energy 3 3 0 3 3  Change in appetite 2 3 0 3 3  Feeling bad or failure about yourself  2 3 0 3 1  Trouble concentrating 3 3 0 3 0  Moving slowly or fidgety/restless 2 3 0 0 0  Suicidal thoughts 0 3 0 0 0  PHQ-9 Score 17 27 0 19 7  Difficult doing work/chores Somewhat difficult Extremely dIfficult  Somewhat difficult Somewhat difficult       03/17/2023    3:18 PM 09/23/2022    2:42 PM 07/18/2021  8:39 AM 12/20/2019    1:36 PM  GAD 7 : Generalized Anxiety Score  Nervous, Anxious, on Edge 3 3 1 1   Control/stop worrying 3 3 0 1  Worry too much - different things 3 3 0 1  Trouble relaxing 3 3 1 1   Restless 3 3 0 1  Easily annoyed or irritable 3 3 0 1  Afraid - awful might happen 3 3 0 1  Total GAD 7 Score 21 21 2 7   Anxiety Difficulty Somewhat difficult Extremely difficult Not difficult at all Somewhat difficult   Past Medical History:  Past Medical History:  Diagnosis Date   Anxiety    Bipolar disorder (HCC) 2019   Depression    H/O postpartum depression, currently pregnant    Herpes genitalis in women    High  cholesterol    Pelvic floor dysfunction in female 10/2019   PMDD (premenstrual dysphoric disorder)    PMDD (premenstrual dysphoric disorder)     Surgical History:  Past Surgical History:  Procedure Laterality Date   CERVICAL BIOPSY     no surgical history      Medications:  Current Outpatient Medications on File Prior to Visit  Medication Sig   albuterol (VENTOLIN HFA) 108 (90 Base) MCG/ACT inhaler Inhale 2 puffs into the lungs every 6 (six) hours as needed for wheezing or shortness of breath.   lamoTRIgine (LAMICTAL) 25 MG tablet Take by mouth.   valACYclovir (VALTREX) 500 MG tablet Take 1 tablet (500 mg total) by mouth daily.   [DISCONTINUED] ARIPiprazole (ABILIFY) 10 MG tablet Take 15 mg by mouth daily.    No current facility-administered medications on file prior to visit.    Allergies:  Allergies  Allergen Reactions   Amoxicillin Diarrhea    Social History:  Social History   Socioeconomic History   Marital status: Married    Spouse name: Not on file   Number of children: 3   Years of education: Not on file   Highest education level: Not on file  Occupational History   Not on file  Tobacco Use   Smoking status: Former   Smokeless tobacco: Never  Vaping Use   Vaping Use: Never used  Substance and Sexual Activity   Alcohol use: Not Currently    Alcohol/week: 0.0 standard drinks of alcohol    Comment: occasionally   Drug use: No   Sexual activity: Yes    Birth control/protection: None, Injection  Other Topics Concern   Not on file  Social History Narrative   Not on file   Social Determinants of Health   Financial Resource Strain: Low Risk  (04/29/2019)   Overall Financial Resource Strain (CARDIA)    Difficulty of Paying Living Expenses: Not hard at all  Food Insecurity: No Food Insecurity (04/29/2019)   Hunger Vital Sign    Worried About Running Out of Food in the Last Year: Never true    Ran Out of Food in the Last Year: Never true  Transportation  Needs: No Transportation Needs (04/29/2019)   PRAPARE - Administrator, Civil Service (Medical): No    Lack of Transportation (Non-Medical): No  Physical Activity: Inactive (04/29/2019)   Exercise Vital Sign    Days of Exercise per Week: 0 days    Minutes of Exercise per Session: 0 min  Stress: No Stress Concern Present (04/29/2019)   [employer] of Occupational Health - Occupational Stress Questionnaire    Feeling of Stress : Not at all  Social  Connections: Moderately Isolated (04/29/2019)   Social Connection and Isolation Panel [NHANES]    Frequency of Communication with Friends and Family: More than three times a week    Frequency of Social Gatherings with Friends and Family: More than three times a week    Attends Religious Services: Never    Database administrator or Organizations: No    Attends Banker Meetings: Never    Marital Status: Married  Catering manager Violence: Not At Risk (04/29/2019)   Humiliation, Afraid, Rape, and Kick questionnaire    Fear of Current or Ex-Partner: No    Emotionally Abused: No    Physically Abused: No    Sexually Abused: No   Social History   Tobacco Use  Smoking Status Former  Smokeless Tobacco Never   Social History   Substance and Sexual Activity  Alcohol Use Not Currently   Alcohol/week: 0.0 standard drinks of alcohol   Comment: occasionally    Family History:  Family History  Problem Relation Age of Onset   Hypertension Mother    Hyperlipidemia Mother    Healthy Father    Pancreatic cancer Maternal Uncle    Stroke Maternal Grandfather    Cancer Maternal Grandfather    Lupus Paternal Aunt    GER disease Paternal Grandmother    Kidney cancer Neg Hx     Past medical history, surgical history, medications, allergies, family history and social history reviewed with patient today and changes made to appropriate areas of the chart.   ROS All other ROS negative except what is listed above and in the HPI.       Objective:    BP 114/84   Pulse 93   Temp 97.7 F (36.5 C) (Oral)   Ht 5' 2.99" (1.6 m)   Wt 139 lb 6.4 oz (63.2 kg)   SpO2 98%   BMI 24.70 kg/m   Wt Readings from Last 3 Encounters:  03/17/23 139 lb 6.4 oz (63.2 kg)  09/23/22 149 lb 9.6 oz (67.9 kg)  09/03/22 154 lb 12.8 oz (70.2 kg)    Physical Exam Vitals and nursing note reviewed. Exam conducted with a chaperone present.  Constitutional:      General: She is awake. She is not in acute distress.    Appearance: She is well-developed and well-groomed. She is not ill-appearing or toxic-appearing.  HENT:     Head: Normocephalic and atraumatic.     Right Ear: Hearing, tympanic membrane, ear canal and external ear normal. No drainage.     Left Ear: Hearing, tympanic membrane, ear canal and external ear normal. No drainage.     Nose: Nose normal.     Right Sinus: No maxillary sinus tenderness or frontal sinus tenderness.     Left Sinus: No maxillary sinus tenderness or frontal sinus tenderness.     Mouth/Throat:     Mouth: Mucous membranes are moist.     Pharynx: Oropharynx is clear. Uvula midline. No pharyngeal swelling, oropharyngeal exudate or posterior oropharyngeal erythema.  Eyes:     General: Lids are normal.        Right eye: No discharge.        Left eye: No discharge.     Extraocular Movements: Extraocular movements intact.     Conjunctiva/sclera: Conjunctivae normal.     Pupils: Pupils are equal, round, and reactive to light.     Visual Fields: Right eye visual fields normal and left eye visual fields normal.  Neck:  Thyroid: No thyromegaly.     Vascular: No carotid bruit.     Trachea: Trachea normal.  Cardiovascular:     Rate and Rhythm: Normal rate and regular rhythm.     Heart sounds: Normal heart sounds. No murmur heard.    No gallop.  Pulmonary:     Effort: Pulmonary effort is normal. No accessory muscle usage or respiratory distress.     Breath sounds: Normal breath sounds.  Chest:   Breasts:    Right: Normal.     Left: Normal.  Abdominal:     General: Bowel sounds are normal.     Palpations: Abdomen is soft. There is no hepatomegaly or splenomegaly.     Tenderness: There is no abdominal tenderness.  Musculoskeletal:        General: Normal range of motion.     Cervical back: Normal range of motion and neck supple.     Right lower leg: No edema.     Left lower leg: No edema.  Lymphadenopathy:     Head:     Right side of head: No submental, submandibular, tonsillar, preauricular or posterior auricular adenopathy.     Left side of head: No submental, submandibular, tonsillar, preauricular or posterior auricular adenopathy.     Cervical: No cervical adenopathy.     Upper Body:     Right upper body: No supraclavicular, axillary or pectoral adenopathy.     Left upper body: No supraclavicular, axillary or pectoral adenopathy.  Skin:    General: Skin is warm and dry.     Capillary Refill: Capillary refill takes less than 2 seconds.     Findings: No rash.  Neurological:     Mental Status: She is alert and oriented to person, place, and time.     Gait: Gait is intact.     Deep Tendon Reflexes: Reflexes are normal and symmetric.     Reflex Scores:      Brachioradialis reflexes are 2+ on the right side and 2+ on the left side.      Patellar reflexes are 2+ on the right side and 2+ on the left side. Psychiatric:        Attention and Perception: Attention normal.        Mood and Affect: Mood normal.        Speech: Speech normal.        Behavior: Behavior normal. Behavior is cooperative.        Thought Content: Thought content normal.        Judgment: Judgment normal.     Results for orders placed or performed during the hospital encounter of 05/13/22  Basic metabolic panel  Result Value Ref Range   Sodium 140 135 - 145 mmol/L   Potassium 3.6 3.5 - 5.1 mmol/L   Chloride 111 98 - 111 mmol/L   CO2 20 (L) 22 - 32 mmol/L   Glucose, Bld 82 70 - 99 mg/dL   BUN 8 6 -  20 mg/dL   Creatinine, Ser 1.61 0.44 - 1.00 mg/dL   Calcium 8.5 (L) 8.9 - 10.3 mg/dL   GFR, Estimated >09 >60 mL/min   Anion gap 9 5 - 15  CBC  Result Value Ref Range   WBC 10.5 4.0 - 10.5 K/uL   RBC 4.23 3.87 - 5.11 MIL/uL   Hemoglobin 13.0 12.0 - 15.0 g/dL   HCT 45.4 09.8 - 11.9 %   MCV 91.5 80.0 - 100.0 fL   MCH 30.7 26.0 - 34.0 pg  MCHC 33.6 30.0 - 36.0 g/dL   RDW 46.9 62.9 - 52.8 %   Platelets 264 150 - 400 K/uL   nRBC 0.0 0.0 - 0.2 %  Urinalysis, Routine w reflex microscopic Urine, Clean Catch  Result Value Ref Range   Color, Urine STRAW (A) YELLOW   APPearance CLEAR (A) CLEAR   Specific Gravity, Urine 1.009 1.005 - 1.030   pH 5.0 5.0 - 8.0   Glucose, UA NEGATIVE NEGATIVE mg/dL   Hgb urine dipstick MODERATE (A) NEGATIVE   Bilirubin Urine NEGATIVE NEGATIVE   Ketones, ur NEGATIVE NEGATIVE mg/dL   Protein, ur NEGATIVE NEGATIVE mg/dL   Nitrite NEGATIVE NEGATIVE   Leukocytes,Ua NEGATIVE NEGATIVE   RBC / HPF 0-5 0 - 5 RBC/hpf   WBC, UA 0-5 0 - 5 WBC/hpf   Bacteria, UA RARE (A) NONE SEEN   Squamous Epithelial / HPF 6-10 0 - 5   Mucus PRESENT    Hyaline Casts, UA PRESENT   POC urine preg, ED  Result Value Ref Range   Preg Test, Ur NEGATIVE NEGATIVE  Troponin I (High Sensitivity)  Result Value Ref Range   Troponin I (High Sensitivity) 4 <18 ng/L      Assessment & Plan:   Problem List Items Addressed This Visit       Musculoskeletal and Integument   Pelvic floor dysfunction (Chronic)    Stable at this time.  Recommend she continue exercises at home and will monitor.        Other   PTSD (post-traumatic stress disorder) (Chronic)    Chronic, ongoing.  Continue collaboration with therapy and current medication regimen.  Refills on Prozac sent, she reports having enough Lamictal.  Denies SI/HI.      Relevant Medications   FLUoxetine (PROZAC) 20 MG tablet   Other Relevant Orders   Comprehensive metabolic panel   TSH   Seizure-like activity (HCC) - Primary  (Chronic)    None since July 2023 -- had reassuring cardiac and neurology work-up. Continue to monitor.  Labs today.      Other Visit Diagnoses     Vitamin B12 deficiency       History of low levels reported, check today and start supplement as needed.   Relevant Orders   CBC with Differential/Platelet   Vitamin B12   Vitamin D deficiency       History of low levels reported, check today and start supplement as needed.   Relevant Orders   VITAMIN D 25 Hydroxy (Vit-D Deficiency, Fractures)   Birth control counseling       Discussed at length options, she wishes to think about these and hold of on prescription at this time.   Encounter for lipid screening for cardiovascular disease       Lipid panel on labs today.   Relevant Orders   Lipid Panel w/o Chol/HDL Ratio   Need for hepatitis C screening test       Hep C screen on labs today per guidelines for one time screening, discussed with patient.   Relevant Orders   Hepatitis C antibody   Encounter for annual physical exam       Annual physical today with labs and health maintenance reviewed, discussed with patient.        Follow up plan: Return in about 1 year (around 03/16/2024) for Annual physical.   LABORATORY TESTING:  - Pap smear: up to date  IMMUNIZATIONS:   - Tdap: Tetanus vaccination status reviewed: last tetanus booster within 10  years. - Influenza: Up to date - Pneumovax: Not applicable - Prevnar: Not applicable - COVID: Up to date - HPV: Up to date - Shingrix vaccine: Not applicable  SCREENING: -Mammogram: Not applicable  - Colonoscopy: Not applicable  - Bone Density: Not applicable  -Hearing Test: Not applicable  -Spirometry: Not applicable   PATIENT COUNSELING:   Advised to take 1 mg of folate supplement per day if capable of pregnancy.   Sexuality: Discussed sexually transmitted diseases, partner selection, use of condoms, avoidance of unintended pregnancy  and contraceptive alternatives.    Advised to avoid cigarette smoking.  I discussed with the patient that most people either abstain from alcohol or drink within safe limits (<=14/week and <=4 drinks/occasion for males, <=7/weeks and <= 3 drinks/occasion for females) and that the risk for alcohol disorders and other health effects rises proportionally with the number of drinks per week and how often a drinker exceeds daily limits.  Discussed cessation/primary prevention of drug use and availability of treatment for abuse.   Diet: Encouraged to adjust caloric intake to maintain  or achieve ideal body weight, to reduce intake of dietary saturated fat and total fat, to limit sodium intake by avoiding high sodium foods and not adding table salt, and to maintain adequate dietary potassium and calcium preferably from fresh fruits, vegetables, and low-fat dairy products.    Stressed the importance of regular exercise  Injury prevention: Discussed safety belts, safety helmets, smoke detector, smoking near bedding or upholstery.   Dental health: Discussed importance of regular tooth brushing, flossing, and dental visits.    NEXT PREVENTATIVE PHYSICAL DUE IN 1 YEAR. Return in about 1 year (around 03/16/2024) for Annual physical.

## 2023-03-17 NOTE — Assessment & Plan Note (Signed)
Stable at this time.  Recommend she continue exercises at home and will monitor.

## 2023-03-17 NOTE — Assessment & Plan Note (Signed)
Chronic, ongoing.  Continue collaboration with therapy and current medication regimen.  Refills on Prozac sent, she reports having enough Lamictal.  Denies SI/HI.

## 2023-03-17 NOTE — Assessment & Plan Note (Addendum)
None since July 2023 -- had reassuring cardiac and neurology work-up. Continue to monitor.  Labs today.

## 2023-03-18 ENCOUNTER — Telehealth: Payer: Self-pay

## 2023-03-18 LAB — VITAMIN D 25 HYDROXY (VIT D DEFICIENCY, FRACTURES): Vit D, 25-Hydroxy: 23.5 ng/mL — ABNORMAL LOW (ref 30.0–100.0)

## 2023-03-18 LAB — COMPREHENSIVE METABOLIC PANEL
ALT: 12 IU/L (ref 0–32)
AST: 14 IU/L (ref 0–40)
Albumin/Globulin Ratio: 2.1 (ref 1.2–2.2)
Albumin: 4.7 g/dL (ref 4.0–5.0)
Alkaline Phosphatase: 109 IU/L (ref 44–121)
BUN/Creatinine Ratio: 8 — ABNORMAL LOW (ref 9–23)
BUN: 7 mg/dL (ref 6–20)
Bilirubin Total: 0.3 mg/dL (ref 0.0–1.2)
CO2: 22 mmol/L (ref 20–29)
Calcium: 9.6 mg/dL (ref 8.7–10.2)
Chloride: 102 mmol/L (ref 96–106)
Creatinine, Ser: 0.86 mg/dL (ref 0.57–1.00)
Globulin, Total: 2.2 g/dL (ref 1.5–4.5)
Glucose: 82 mg/dL (ref 70–99)
Potassium: 3.9 mmol/L (ref 3.5–5.2)
Sodium: 139 mmol/L (ref 134–144)
Total Protein: 6.9 g/dL (ref 6.0–8.5)
eGFR: 95 mL/min/{1.73_m2} (ref >59)

## 2023-03-18 LAB — HEPATITIS C ANTIBODY: Hep C Virus Ab: NONREACTIVE

## 2023-03-18 LAB — VITAMIN B12: Vitamin B-12: 751 pg/mL (ref 232–1245)

## 2023-03-18 LAB — CBC WITH DIFFERENTIAL/PLATELET
Basophils Absolute: 0.1 10*3/uL (ref 0.0–0.2)
Basos: 1 %
EOS (ABSOLUTE): 0.8 10*3/uL — ABNORMAL HIGH (ref 0.0–0.4)
Eos: 9 %
Hematocrit: 41 % (ref 34.0–46.6)
Hemoglobin: 14 g/dL (ref 11.1–15.9)
Immature Grans (Abs): 0 10*3/uL (ref 0.0–0.1)
Immature Granulocytes: 0 %
Lymphocytes Absolute: 2.3 10*3/uL (ref 0.7–3.1)
Lymphs: 26 %
MCH: 31.3 pg (ref 26.6–33.0)
MCHC: 34.1 g/dL (ref 31.5–35.7)
MCV: 92 fL (ref 79–97)
Monocytes Absolute: 0.5 10*3/uL (ref 0.1–0.9)
Monocytes: 6 %
Neutrophils Absolute: 5 10*3/uL (ref 1.4–7.0)
Neutrophils: 58 %
Platelets: 296 10*3/uL (ref 150–450)
RBC: 4.47 x10E6/uL (ref 3.77–5.28)
RDW: 13.3 % (ref 11.7–15.4)
WBC: 8.6 10*3/uL (ref 3.4–10.8)

## 2023-03-18 LAB — LIPID PANEL W/O CHOL/HDL RATIO
Cholesterol, Total: 189 mg/dL (ref 100–199)
HDL: 46 mg/dL (ref >39)
LDL Chol Calc (NIH): 112 mg/dL — ABNORMAL HIGH (ref 0–99)
Triglycerides: 175 mg/dL — ABNORMAL HIGH (ref 0–149)
VLDL Cholesterol Cal: 31 mg/dL (ref 5–40)

## 2023-03-18 LAB — TSH: TSH: 1.17 u[IU]/mL (ref 0.450–4.500)

## 2023-03-18 NOTE — Progress Notes (Signed)
Contacted via MyChart   Good afternoon Chelsea Parks, your labs have returned: - CBC shows no anemia or infection. - Kidney function, creatinine and eGFR, remains normal, as is liver function, AST and ALT.  - Vitamin D level a little low, I recommend you start taking Vitamin D3 2000 units daily as we discussed yesterday for overall bone health.  B12 level normal. - TSH, thyroid lab, normal. - Hep C is negative. - Your LDL is above normal. The LDL is the bad cholesterol. Over time and in combination with inflammation and other factors, this contributes to plaque which in turn may lead to stroke and/or heart attack down the road. Sometimes high LDL is primarily genetic, and people might be eating all the right foods but still have high numbers. Other times, there is room for improvement in one's diet and eating healthier can bring this number down and potentially reduce one's risk of heart attack and/or stroke.   To reduce your LDL, Remember - more fruits and vegetables, more fish, and limit red meat and dairy products. More soy, nuts, beans, barley, lentils, oats and plant sterol ester enriched margarine instead of butter. I also encourage eliminating sugar and processed food. Remember, shop on the outside of the grocery store and visit your [employer]. If you would like to talk with me about dietary changes for your cholesterol, please let me know. We should recheck your cholesterol in 12 months.  Any questions? Keep being amazing!!  Thank you for allowing me to participate in your care.  I appreciate you. Kindest regards, Jaquawn Saffran

## 2023-03-18 NOTE — Telephone Encounter (Signed)
PA started for Fluoxetine 20mg  through [employer]. Awaiting on determination

## 2023-03-19 ENCOUNTER — Encounter: Payer: Self-pay | Admitting: Nurse Practitioner

## 2023-03-20 ENCOUNTER — Encounter: Payer: Self-pay | Admitting: Nurse Practitioner

## 2023-03-21 DIAGNOSIS — F4310 Post-traumatic stress disorder, unspecified: Secondary | ICD-10-CM | POA: Diagnosis not present

## 2023-03-25 DIAGNOSIS — F4310 Post-traumatic stress disorder, unspecified: Secondary | ICD-10-CM | POA: Diagnosis not present

## 2023-03-29 DIAGNOSIS — Z419 Encounter for procedure for purposes other than remedying health state, unspecified: Secondary | ICD-10-CM | POA: Diagnosis not present

## 2023-04-01 DIAGNOSIS — F4310 Post-traumatic stress disorder, unspecified: Secondary | ICD-10-CM | POA: Diagnosis not present

## 2023-04-07 DIAGNOSIS — F4310 Post-traumatic stress disorder, unspecified: Secondary | ICD-10-CM | POA: Diagnosis not present

## 2023-04-08 DIAGNOSIS — J310 Chronic rhinitis: Secondary | ICD-10-CM | POA: Diagnosis not present

## 2023-04-15 DIAGNOSIS — F4310 Post-traumatic stress disorder, unspecified: Secondary | ICD-10-CM | POA: Diagnosis not present

## 2023-04-17 DIAGNOSIS — H52223 Regular astigmatism, bilateral: Secondary | ICD-10-CM | POA: Diagnosis not present

## 2023-04-17 DIAGNOSIS — H5213 Myopia, bilateral: Secondary | ICD-10-CM | POA: Diagnosis not present

## 2023-04-20 DIAGNOSIS — J45901 Unspecified asthma with (acute) exacerbation: Secondary | ICD-10-CM | POA: Diagnosis not present

## 2023-04-20 DIAGNOSIS — F419 Anxiety disorder, unspecified: Secondary | ICD-10-CM | POA: Diagnosis not present

## 2023-04-20 DIAGNOSIS — R0602 Shortness of breath: Secondary | ICD-10-CM | POA: Diagnosis not present

## 2023-04-20 DIAGNOSIS — F32A Depression, unspecified: Secondary | ICD-10-CM | POA: Diagnosis not present

## 2023-04-20 DIAGNOSIS — Z881 Allergy status to other antibiotic agents status: Secondary | ICD-10-CM | POA: Diagnosis not present

## 2023-04-22 DIAGNOSIS — Z3A01 Less than 8 weeks gestation of pregnancy: Secondary | ICD-10-CM | POA: Diagnosis not present

## 2023-04-22 DIAGNOSIS — F419 Anxiety disorder, unspecified: Secondary | ICD-10-CM | POA: Diagnosis not present

## 2023-04-22 DIAGNOSIS — O99511 Diseases of the respiratory system complicating pregnancy, first trimester: Secondary | ICD-10-CM | POA: Diagnosis not present

## 2023-04-22 DIAGNOSIS — J4540 Moderate persistent asthma, uncomplicated: Secondary | ICD-10-CM | POA: Diagnosis not present

## 2023-04-22 DIAGNOSIS — J309 Allergic rhinitis, unspecified: Secondary | ICD-10-CM | POA: Diagnosis not present

## 2023-04-22 DIAGNOSIS — O99341 Other mental disorders complicating pregnancy, first trimester: Secondary | ICD-10-CM | POA: Diagnosis not present

## 2023-04-22 DIAGNOSIS — J45909 Unspecified asthma, uncomplicated: Secondary | ICD-10-CM | POA: Diagnosis not present

## 2023-04-24 ENCOUNTER — Ambulatory Visit: Payer: Medicaid Other | Admitting: Licensed Practical Nurse

## 2023-04-24 DIAGNOSIS — Z114 Encounter for screening for human immunodeficiency virus [HIV]: Secondary | ICD-10-CM | POA: Diagnosis not present

## 2023-04-24 DIAGNOSIS — Z131 Encounter for screening for diabetes mellitus: Secondary | ICD-10-CM | POA: Diagnosis not present

## 2023-04-24 DIAGNOSIS — Z1159 Encounter for screening for other viral diseases: Secondary | ICD-10-CM | POA: Diagnosis not present

## 2023-04-24 DIAGNOSIS — J4540 Moderate persistent asthma, uncomplicated: Secondary | ICD-10-CM | POA: Diagnosis not present

## 2023-04-24 DIAGNOSIS — Z13220 Encounter for screening for lipoid disorders: Secondary | ICD-10-CM | POA: Diagnosis not present

## 2023-04-24 DIAGNOSIS — G2581 Restless legs syndrome: Secondary | ICD-10-CM | POA: Diagnosis not present

## 2023-04-24 NOTE — Progress Notes (Signed)
    NURSE VISIT NOTE  Subjective:    Patient ID: Chelsea Parks, female    DOB: July 05, 1996, 27 y.o.   MRN: [id number]  HPI  Patient is a 27 y.o. G16P3003 female who presents for evaluation of amenorrhea. She believes she could be pregnant. Pregnancy is not desired. Sexual Activity: single partner, contraception: none. Current symptoms also include: breast tenderness, fatigue, morning sickness, and positive home pregnancy test. Last period was abnormal. She is undecided about what she will do. She said things are complicated, she is going through a divorce and starting a new job.     Objective:    BP 100/66   Pulse 91   Resp 16   Ht 5\' 3"  (1.6 m)   Wt 145 lb 9.6 oz (66 kg)   LMP 03/19/2023 (Exact Date)   BMI 25.79 kg/m   Lab Review  Results for orders placed or performed in visit on 04/25/23  POCT urine pregnancy  Result Value Ref Range   Preg Test, Ur Positive (A) Negative    Assessment:   1. Amenorrhea     Plan:   Pregnancy Test: Positive  Estimated Date of Delivery: 12/24/23 Encouraged well-balanced diet, plenty of rest when needed, pre-natal vitamins daily and walking for exercise.  Discussed self-help for nausea, avoiding OTC medications until consulting provider or pharmacist, other than Tylenol as needed, minimal caffeine (1-2 cups daily) and avoiding alcohol.   She will schedule her nurse visit @ [redacted] wks pregnant, u/s for dating and labs @10  wk, and NOB visit at [redacted] wk pregnant.    Feel free to call with any questions.   Santiago Bumpers, CMA [facility]

## 2023-04-25 ENCOUNTER — Ambulatory Visit (INDEPENDENT_AMBULATORY_CARE_PROVIDER_SITE_OTHER): Payer: Medicaid Other

## 2023-04-25 VITALS — BP 100/66 | HR 91 | Resp 16 | Ht 63.0 in | Wt 145.6 lb

## 2023-04-25 DIAGNOSIS — N912 Amenorrhea, unspecified: Secondary | ICD-10-CM

## 2023-04-25 DIAGNOSIS — Z3201 Encounter for pregnancy test, result positive: Secondary | ICD-10-CM

## 2023-04-25 LAB — POCT URINE PREGNANCY: Preg Test, Ur: POSITIVE — AB

## 2023-04-25 NOTE — Patient Instructions (Signed)
Common Medications Safe in Pregnancy  Acne:      Constipation:  Benzoyl Peroxide     Colace  Clindamycin      Dulcolax Suppository  Topica Erythromycin     Fibercon  Salicylic Acid      Metamucil         Miralax AVOID:        Senakot   Accutane    Cough:  Retin-A       Cough Drops  Tetracycline      Phenergan w/ Codeine if Rx  Minocycline      Robitussin (Plain & DM)  Antibiotics:     Crabs/Lice:  Ceclor       RID  Cephalosporins    AVOID:  E-Mycins      Kwell  Keflex  Macrobid/Macrodantin   Diarrhea:  Penicillin      Kao-Pectate  Zithromax      Imodium AD         PUSH FLUIDS AVOID:       Cipro     Fever:  Tetracycline      Tylenol (Regular or Extra  Minocycline       Strength)  Levaquin      Extra Strength-Do not          Exceed 8 tabs/24 hrs Caffeine:        <200mg/day (equiv. To 1 cup of coffee or  approx. 3 12 oz sodas)         Gas: Cold/Hayfever:       Gas-X  Benadryl      Mylicon  Claritin       Phazyme  **Claritin-D        Chlor-Trimeton    Headaches:  Dimetapp      ASA-Free Excedrin  Drixoral-Non-Drowsy     Cold Compress  Mucinex (Guaifenasin)     Tylenol (Regular or Extra  Sudafed/Sudafed-12 Hour     Strength)  **Sudafed PE Pseudoephedrine   Tylenol Cold & Sinus     Vicks Vapor Rub  Zyrtec  **AVOID if Problems With Blood Pressure         Heartburn: Avoid lying down for at least 1 hour after meals  Aciphex      Maalox     Rash:  Milk of Magnesia     Benadryl    Mylanta       1% Hydrocortisone Cream  Pepcid  Pepcid Complete   Sleep Aids:  Prevacid      Ambien   Prilosec       Benadryl  Rolaids       Chamomile Tea  Tums (Limit 4/day)     Unisom         Tylenol PM         Warm milk-add vanilla or  Hemorrhoids:       Sugar for taste  Anusol/Anusol H.C.  (RX: Analapram 2.5%)  Sugar Substitutes:  Hydrocortisone OTC     Ok in moderation  Preparation H      Tucks        Vaseline lotion applied to tissue with  wiping    Herpes:     Throat:  Acyclovir      Oragel  Famvir  Valtrex     Vaccines:         Flu Shot Leg Cramps:       *Gardasil  Benadryl      Hepatitis A         Hepatitis B Nasal Spray:         Pneumovax  Saline Nasal Spray     Polio Booster         Tetanus Nausea:       Tuberculosis test or PPD  Vitamin B6 25 mg TID   AVOID:    Dramamine      *Gardasil  Emetrol       Live Poliovirus  Ginger Root 250 mg QID    MMR (measles, mumps &  High Complex Carbs @ Bedtime    rebella)  Sea Bands-Accupressure    Varicella (Chickenpox)  Unisom 1/2 tab TID     *No known complications           If received before Pain:         Known pregnancy;   Darvocet       Resume series after  Lortab        Delivery  Percocet    Yeast:   Tramadol      Femstat  Tylenol 3      Gyne-lotrimin  Ultram       Monistat  Vicodin           MISC:         All Sunscreens           Hair Coloring/highlights          Insect Repellant's          (Including DEET)         Mystic Tans     Morning Sickness  Morning sickness is when you feel like you may vomit (feel nauseous) during pregnancy. Sometimes, you may vomit. Morning sickness most often happens in the morning, but it can also happen at any time of the day. Some women may have morning sickness that makes them vomit all the time. This is a more serious problem that needs treatment. What are the causes? The cause of this condition is not known. What increases the risk? You had vomiting or a feeling like you may vomit before your pregnancy. You had morning sickness in another pregnancy. You are pregnant with more than one baby, such as twins. What are the signs or symptoms? Feeling like you may vomit. Vomiting. How is this treated? Treatment is usually not needed for this condition. You may only need to change what you eat. In some cases, your doctor may give you some things to take for your condition. These include: Vitamin B6 supplements. Medicines to  treat the feeling that you may vomit. Ginger. Follow these instructions at home: Medicines Take over-the-counter and prescription medicines only as told by your doctor. Do not take any medicines until you talk with your doctor about them first. Take multivitamins before you get pregnant. These can stop or lessen the symptoms of morning sickness. Eating and drinking Eat dry toast or crackers before getting out of bed. Eat 5 or 6 small meals a day. Eat dry and bland foods like rice and baked potatoes. Do not eat greasy, fatty, or spicy foods. Have someone cook for you if the smell of food causes you to vomit or to feel like you may vomit. If you feel like you may vomit after taking prenatal vitamins, take them at night or with a snack. Eat protein foods when you need a snack. Nuts, yogurt, and cheese are good choices. Drink fluids throughout the day. Try ginger ale made with real ginger, ginger tea made from fresh grated ginger, or ginger candies. General instructions Do not smoke or use any products that contain  nicotine or tobacco. If you need help quitting, ask your doctor. Use an air purifier to keep the air in your house free of smells. Get lots of fresh air. Try to avoid smells that make you feel sick. Try wearing an acupressure wristband. This is a wristband that is used to treat seasickness. Try a treatment called acupuncture. In this treatment, a doctor puts needles into certain areas of your body to make you feel better. Contact a doctor if: You need medicine to feel better. You feel dizzy or light-headed. You are losing weight. Get help right away if: The feeling that you may vomit will not go away, or you cannot stop vomiting. You faint. You have very bad pain in your belly. Summary Morning sickness is when you feel like you may vomit (feel nauseous) during pregnancy. You may feel sick in the morning, but you can feel this way at any time of the day. Making some changes to  what you eat may help your symptoms go away. This information is not intended to replace advice given to you by your health care provider. Make sure you discuss any questions you have with your health care provider. Document Revised: 05/29/2020 Document Reviewed: 05/08/2020 Elsevier Patient Education  2024 [employer]. First Trimester of Pregnancy  The first trimester of pregnancy starts on the first day of your last menstrual period until the end of week 12. This is also called months 1 through 3 of pregnancy. Body changes during your first trimester Your body goes through many changes during pregnancy. The changes usually return to normal after your baby is born. Physical changes You may gain or lose weight. Your breasts may grow larger and hurt. The area around your nipples may get darker. Dark spots or blotches may develop on your face. You may have changes in your hair. Health changes You may feel like you might vomit (nauseous), and you may vomit. You may have heartburn. You may have headaches. You may have trouble pooping (constipation). Your gums may bleed. Other changes You may get tired easily. You may pee (urinate) more often. Your menstrual periods will stop. You may not feel hungry. You may want to eat certain kinds of food. You may have changes in your emotions from day to day. You may have more dreams. Follow these instructions at home: Medicines Take over-the-counter and prescription medicines only as told by your doctor. Some medicines are not safe during pregnancy. Take a prenatal vitamin that contains at least 600 micrograms (mcg) of folic acid. Eating and drinking Eat healthy meals that include: Fresh fruits and vegetables. Whole grains. Good sources of protein, such as meat, eggs, or tofu. Low-fat dairy products. Avoid raw meat and unpasteurized juice, milk, and cheese. If you feel like you may vomit, or you vomit: Eat 4 or 5 small meals a day instead of  3 large meals. Try eating a few soda crackers. Drink liquids between meals instead of during meals. You may need to take these actions to prevent or treat trouble pooping: Drink enough fluids to keep your pee (urine) pale yellow. Eat foods that are high in fiber. These include beans, whole grains, and fresh fruits and vegetables. Limit foods that are high in fat and sugar. These include fried or sweet foods. Activity Exercise only as told by your doctor. Most people can do their usual exercise routine during pregnancy. Stop exercising if you have cramps or pain in your lower belly (abdomen) or low back. Do not exercise  if it is too hot or too humid, or if you are in a place of great height (high altitude). Avoid heavy lifting. If you choose to, you may have sex unless your doctor tells you not to. Relieving pain and discomfort Wear a good support bra if your breasts are sore. Rest with your legs raised (elevated) if you have leg cramps or low back pain. If you have bulging veins (varicose veins) in your legs: Wear support hose as told by your doctor. Raise your feet for 15 minutes, 3-4 times a day. Limit salt in your food. Safety Wear your seat belt at all times when you are in a car. Talk with your doctor if someone is hurting you or yelling at you. Talk with your doctor if you are feeling sad or have thoughts of hurting yourself. Lifestyle Do not use hot tubs, steam rooms, or saunas. Do not douche. Do not use tampons or scented sanitary pads. Do not use herbal medicines, illegal drugs, or medicines that are not approved by your doctor. Do not drink alcohol. Do not smoke or use any products that contain nicotine or tobacco. If you need help quitting, ask your doctor. Avoid cat litter boxes and soil that is used by cats. These carry germs that can cause harm to the baby and can cause a loss of your baby by miscarriage or stillbirth. General instructions Keep all follow-up visits. This  is important. Ask for help if you need counseling or if you need help with nutrition. Your doctor can give you advice or tell you where to go for help. Visit your dentist. At home, brush your teeth with a soft toothbrush. Floss gently. Write down your questions. Take them to your prenatal visits. Where to find more information American Pregnancy Association: americanpregnancy.org [employer]ians [employer] Gynecologists: www.acog.org Office on Women's Health: [employer].nz Contact a doctor if: You are dizzy. You have a fever. You have mild cramps or pressure in your lower belly. You have a nagging pain in your belly area. You continue to feel like you may vomit, you vomit, or you have watery poop (diarrhea) for 24 hours or longer. You have a bad-smelling fluid coming from your vagina. You have pain when you pee. You are exposed to a disease that spreads from person to person, such as chickenpox, measles, Zika virus, HIV, or hepatitis. Get help right away if: You have spotting or bleeding from your vagina. You have very bad belly cramping or pain. You have shortness of breath or chest pain. You have any kind of injury, such as from a fall or a car crash. You have new or increased pain, swelling, or redness in an arm or leg. Summary The first trimester of pregnancy starts on the first day of your last menstrual period until the end of week 12 (months 1 through 3). Eat 4 or 5 small meals a day instead of 3 large meals. Do not smoke or use any products that contain nicotine or tobacco. If you need help quitting, ask your doctor. Keep all follow-up visits. This information is not intended to replace advice given to you by your health care provider. Make sure you discuss any questions you have with your health care provider. Document Revised: 03/22/2020 Document Reviewed: 01/27/2020 Elsevier Patient Education  2024 [employer]vier Inc. Commonly Asked Questions During  Pregnancy  Cats: A parasite can be excreted in cat feces.  To avoid exposure you need to have another person empty the little box.  If you must empty the litter box you will need to wear gloves.  Wash your hands after handling your cat.  This parasite can also be found in raw or undercooked meat so this should also be avoided.  Colds, Sore Throats, Flu: Please check your medication sheet to see what you can take for symptoms.  If your symptoms are unrelieved by these medications please call the office.  Dental Work: Most any dental work [employer] recommends is permitted.  X-rays should only be taken during the first trimester if absolutely necessary.  Your abdomen should be shielded with a lead apron during all x-rays.  Please notify your provider prior to receiving any x-rays.  Novocaine is fine; gas is not recommended.  If your dentist requires a note from [name] prior to dental work please call the office and we will provide one for you.  Exercise: Exercise is an important part of staying healthy during your pregnancy.  You may continue most exercises you were accustomed to prior to pregnancy.  Later in your pregnancy you will most likely notice you have difficulty with activities requiring balance like riding a bicycle.  It is important that you listen to your body and avoid activities that put you at a higher risk of falling.  Adequate rest and staying well hydrated are a must!  If you have questions about the safety of specific activities ask your provider.    Exposure to Children with illness: Try to avoid obvious exposure; report any symptoms to [name] when noted,  If you have chicken pos, red measles or mumps, you should be immune to these diseases.   Please do not take any vaccines while pregnant unless you have checked with your OB provider.  Fetal Movement: After 28 weeks we recommend you do "kick counts" twice daily.  Lie or sit down in a calm quiet environment and count your baby movements  "kicks".  You should feel your baby at least 10 times per hour.  If you have not felt 10 kicks within the first hour get up, walk around and have something sweet to eat or drink then repeat for an additional hour.  If count remains less than 10 per hour notify your provider.  Fumigating: Follow your pest control agent's advice as to how long to stay out of your home.  Ventilate the area well before re-entering.  Hemorrhoids:   Most over-the-counter preparations can be used during pregnancy.  Check your medication to see what is safe to use.  It is important to use a stool softener or fiber in your diet and to drink lots of liquids.  If hemorrhoids seem to be getting worse please call the office.   Hot Tubs:  Hot tubs Jacuzzis and saunas are not recommended while pregnant.  These increase your internal body temperature and should be avoided.  Intercourse:  Sexual intercourse is safe during pregnancy as long as you are comfortable, unless otherwise advised by your provider.  Spotting may occur after intercourse; report any bright red bleeding that is heavier than spotting.  Labor:  If you know that you are in labor, please go to the hospital.  If you are unsure, please call the office and let us help you decide what to do.  Lifting, straining, etc:  If your job requires heavy lifting or straining please check with your provider for any limitations.  Generally, you should not lift items heavier than that you can lift simply with your hands and arms (  no back muscles)  Painting:  Paint fumes do not harm your pregnancy, but may make you ill and should be avoided if possible.  Latex or water based paints have less odor than oils.  Use adequate ventilation while painting.  Permanents & Hair Color:  Chemicals in hair dyes are not recommended as they cause increase hair dryness which can increase hair loss during pregnancy.  " Highlighting" and permanents are allowed.  Dye may be absorbed differently and  permanents may not hold as well during pregnancy.  Sunbathing:  Use a sunscreen, as skin burns easily during pregnancy.  Drink plenty of fluids; avoid over heating.  Tanning Beds:  Because their possible side effects are still unknown, tanning beds are not recommended.  Ultrasound Scans:  Routine ultrasounds are performed at approximately 20 weeks.  You will be able to see your baby's general anatomy an if you would like to know the gender this can usually be determined as well.  If it is questionable when you conceived you may also receive an ultrasound early in your pregnancy for dating purposes.  Otherwise ultrasound exams are not routinely performed unless there is a medical necessity.  Although you can request a scan we ask that you pay for it when conducted because insurance does not cover " patient request" scans.  Work: If your pregnancy proceeds without complications you may work until your due date, unless your physician or employer advises otherwise.  Round Ligament Pain/Pelvic Discomfort:  Sharp, shooting pains not associated with bleeding are fairly common, usually occurring in the second trimester of pregnancy.  They tend to be worse when standing up or when you remain standing for long periods of time.  These are the result of pressure of certain pelvic ligaments called "round ligaments".  Rest, Tylenol and heat seem to be the most effective relief.  As the womb and fetus grow, they rise out of the pelvis and the discomfort improves.  Please notify the office if your pain seems different than that described.  It may represent a more serious condition.

## 2023-04-26 DIAGNOSIS — O209 Hemorrhage in early pregnancy, unspecified: Secondary | ICD-10-CM | POA: Diagnosis not present

## 2023-04-26 DIAGNOSIS — O3481 Maternal care for other abnormalities of pelvic organs, first trimester: Secondary | ICD-10-CM | POA: Diagnosis not present

## 2023-04-26 DIAGNOSIS — J45909 Unspecified asthma, uncomplicated: Secondary | ICD-10-CM | POA: Diagnosis not present

## 2023-04-26 DIAGNOSIS — Z87891 Personal history of nicotine dependence: Secondary | ICD-10-CM | POA: Diagnosis not present

## 2023-04-26 DIAGNOSIS — N83202 Unspecified ovarian cyst, left side: Secondary | ICD-10-CM | POA: Diagnosis not present

## 2023-04-26 DIAGNOSIS — O99341 Other mental disorders complicating pregnancy, first trimester: Secondary | ICD-10-CM | POA: Diagnosis not present

## 2023-04-26 DIAGNOSIS — Z7951 Long term (current) use of inhaled steroids: Secondary | ICD-10-CM | POA: Diagnosis not present

## 2023-04-26 DIAGNOSIS — M549 Dorsalgia, unspecified: Secondary | ICD-10-CM | POA: Diagnosis not present

## 2023-04-26 DIAGNOSIS — O99891 Other specified diseases and conditions complicating pregnancy: Secondary | ICD-10-CM | POA: Diagnosis not present

## 2023-04-26 DIAGNOSIS — F32A Depression, unspecified: Secondary | ICD-10-CM | POA: Diagnosis not present

## 2023-04-26 DIAGNOSIS — O99511 Diseases of the respiratory system complicating pregnancy, first trimester: Secondary | ICD-10-CM | POA: Diagnosis not present

## 2023-04-26 DIAGNOSIS — Z3A01 Less than 8 weeks gestation of pregnancy: Secondary | ICD-10-CM | POA: Diagnosis not present

## 2023-04-26 DIAGNOSIS — Z880 Allergy status to penicillin: Secondary | ICD-10-CM | POA: Diagnosis not present

## 2023-04-28 DIAGNOSIS — O4690 Antepartum hemorrhage, unspecified, unspecified trimester: Secondary | ICD-10-CM | POA: Diagnosis not present

## 2023-04-28 DIAGNOSIS — Z419 Encounter for procedure for purposes other than remedying health state, unspecified: Secondary | ICD-10-CM | POA: Diagnosis not present

## 2023-04-28 DIAGNOSIS — J3081 Allergic rhinitis due to animal (cat) (dog) hair and dander: Secondary | ICD-10-CM | POA: Diagnosis not present

## 2023-04-28 DIAGNOSIS — J310 Chronic rhinitis: Secondary | ICD-10-CM | POA: Diagnosis not present

## 2023-04-28 DIAGNOSIS — J343 Hypertrophy of nasal turbinates: Secondary | ICD-10-CM | POA: Diagnosis not present

## 2023-04-28 NOTE — Telephone Encounter (Signed)
Left voicemail to return call.  Patient seen in ED 6/29 for vaginal bleeding/threatened miscarriage. Advised to f/u with [name] for repeat beta. Please schedule for lab apt if patient returns call.

## 2023-04-30 NOTE — Telephone Encounter (Signed)
This encounter was created in error - please disregard.

## 2023-05-02 DIAGNOSIS — F4310 Post-traumatic stress disorder, unspecified: Secondary | ICD-10-CM | POA: Diagnosis not present

## 2023-05-06 DIAGNOSIS — F4310 Post-traumatic stress disorder, unspecified: Secondary | ICD-10-CM | POA: Diagnosis not present

## 2023-05-07 DIAGNOSIS — F4310 Post-traumatic stress disorder, unspecified: Secondary | ICD-10-CM | POA: Diagnosis not present

## 2023-05-09 ENCOUNTER — Ambulatory Visit: Payer: Medicaid Other

## 2023-05-19 DIAGNOSIS — D509 Iron deficiency anemia, unspecified: Secondary | ICD-10-CM | POA: Diagnosis not present

## 2023-05-19 DIAGNOSIS — G2581 Restless legs syndrome: Secondary | ICD-10-CM | POA: Diagnosis not present

## 2023-05-19 DIAGNOSIS — E611 Iron deficiency: Secondary | ICD-10-CM | POA: Diagnosis not present

## 2023-05-22 DIAGNOSIS — J309 Allergic rhinitis, unspecified: Secondary | ICD-10-CM | POA: Diagnosis not present

## 2023-05-29 DIAGNOSIS — Z419 Encounter for procedure for purposes other than remedying health state, unspecified: Secondary | ICD-10-CM | POA: Diagnosis not present

## 2023-06-29 DIAGNOSIS — Z419 Encounter for procedure for purposes other than remedying health state, unspecified: Secondary | ICD-10-CM | POA: Diagnosis not present

## 2023-07-29 DIAGNOSIS — Z419 Encounter for procedure for purposes other than remedying health state, unspecified: Secondary | ICD-10-CM | POA: Diagnosis not present

## 2023-08-29 DIAGNOSIS — Z419 Encounter for procedure for purposes other than remedying health state, unspecified: Secondary | ICD-10-CM | POA: Diagnosis not present

## 2023-09-12 ENCOUNTER — Encounter: Payer: Self-pay | Admitting: Emergency Medicine

## 2023-09-12 ENCOUNTER — Ambulatory Visit
Admission: EM | Admit: 2023-09-12 | Discharge: 2023-09-12 | Disposition: A | Discharge status: Home / Self Care | Payer: Medicaid Other | Attending: Emergency Medicine | Admitting: Emergency Medicine

## 2023-09-12 DIAGNOSIS — B9689 Other specified bacterial agents as the cause of diseases classified elsewhere: Secondary | ICD-10-CM

## 2023-09-12 DIAGNOSIS — N760 Acute vaginitis: Secondary | ICD-10-CM

## 2023-09-12 DIAGNOSIS — J069 Acute upper respiratory infection, unspecified: Secondary | ICD-10-CM | POA: Diagnosis not present

## 2023-09-12 LAB — URINALYSIS, W/ REFLEX TO CULTURE (INFECTION SUSPECTED)
Bilirubin Urine: NEGATIVE
Glucose, UA: NEGATIVE mg/dL
Hgb urine dipstick: NEGATIVE
Ketones, ur: NEGATIVE mg/dL
Leukocytes,Ua: NEGATIVE
Nitrite: NEGATIVE
Protein, ur: NEGATIVE mg/dL
RBC / HPF: NONE SEEN RBC/hpf (ref 0–5)
Specific Gravity, Urine: 1.01 (ref 1.005–1.030)
WBC, UA: NONE SEEN WBC/hpf (ref 0–5)
pH: 6 (ref 5.0–8.0)

## 2023-09-12 LAB — WET PREP, GENITAL
Sperm: NONE SEEN
Trich, Wet Prep: NONE SEEN
WBC, Wet Prep HPF POC: >10 — AB (ref <10)
Yeast Wet Prep HPF POC: NONE SEEN

## 2023-09-12 LAB — GROUP A STREP BY PCR: Group A Strep by PCR: NOT DETECTED

## 2023-09-12 MED ORDER — BENZONATATE 100 MG PO CAPS: 200.0000 mg | ORAL_CAPSULE | Freq: Three times a day (TID) | ORAL | 0 refills | Status: DC

## 2023-09-12 MED ORDER — PROMETHAZINE-DM 6.25-15 MG/5ML PO SYRP: 5.0000 mL | ORAL_SOLUTION | Freq: Four times a day (QID) | ORAL | 0 refills | Status: DC | PRN

## 2023-09-12 MED ORDER — IPRATROPIUM BROMIDE 0.06 % NA SOLN: 2.0000 | Freq: Four times a day (QID) | NASAL | 12 refills | Status: DC

## 2023-09-12 MED ORDER — METRONIDAZOLE 500 MG PO TABS: 500.0000 mg | ORAL_TABLET | Freq: Two times a day (BID) | ORAL | 0 refills | Status: DC

## 2023-09-12 NOTE — Discharge Instructions (Addendum)
Take the Flagyl (metronidazole) 500 mg twice daily for treatment of your bacterial vaginosis.  Avoid alcohol while on the metronidazole as taken together will cause of vomiting.  Bacterial vaginosis is often caused by a imbalance of bacteria in your vaginal vault.  This is sometimes a result of using tampons or hormonal fluctuations during her menstrual cycle.  You if your symptoms are recurrent you can try using a boric acid suppository twice weekly to help maintain the acid-base balance in your vagina vault which could prevent further infection.  You can also try vaginal probiotics to help return normal bacterial balance.   You tested negative for strep throat.  I do feel you have a viral upper respiratory infection which is causing your respiratory symptoms.  Use over-the-counter Tylenol and/or ibuprofen according the package instructions as needed for any fever or pain.  Use the Atrovent nasal spray, 2 squirts in each nostril every 6 hours, as needed for runny nose and postnasal drip.  Use the Tessalon Perles every 8 hours during the day.  Take them with a small sip of water.  They may give you some numbness to the base of your tongue or a metallic taste in your mouth, this is normal.  Use the Promethazine DM cough syrup at bedtime for cough and congestion.  It will make you drowsy so do not take it during the day.  Return for reevaluation or see your primary care provider for any new or worsening symptoms.

## 2023-09-12 NOTE — ED Provider Notes (Signed)
[facility]    CSN: [id number] Arrival date & time: 09/12/23  1038      History   Chief Complaint Chief Complaint  Patient presents with   Urinary Frequency   Back Pain   Cough    HPI Chelsea Parks is a 27 y.o. female.   HPI  27 year old female with a past medical history significant for PMDD, pelvic floor dysfunction, high cholesterol, depression, anxiety, and bipolar disorder presents for evaluation of 1 week worth of urinary symptoms and 3 days worth of respiratory symptoms.  Her urinary symptoms include dysuria, which has resolved, urgency, and frequency but no hematuria.  She also Dors is low back pain.  She denies any vaginal discharge or itching.  Respiratory symptoms consist of runny nose, nasal congestion, sore throat, and a cough that is intermittently productive.  She has not measured a fever at home and she denies ear pain, shortness of breath, or wheezing.  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 Authorizing Provider  benzonatate (TESSALON) 100 MG  capsule Take 2 capsules (200 mg total) by mouth every 8 (eight) hours. 09/12/23  Yes Becky Augusta, NP  ipratropium (ATROVENT) 0.06 % nasal spray Place 2 sprays into both nostrils 4 (four) times daily. 09/12/23  Yes Becky Augusta, NP  metroNIDAZOLE (FLAGYL) 500 MG tablet Take 1 tablet (500 mg total) by mouth 2 (two) times daily. 09/12/23  Yes Becky Augusta, NP  promethazine-dextromethorphan (PROMETHAZINE-DM) 6.25-15 MG/5ML syrup Take 5 mLs by mouth 4 (four) times daily as needed. 09/12/23  Yes Becky Augusta, NP  albuterol (VENTOLIN HFA) 108 (90 Base) MCG/ACT inhaler Inhale 2 puffs into the lungs every 6 (six) hours as needed for wheezing or shortness of breath. 09/03/22   Cannady, Corrie Dandy T, NP  cetirizine (ZYRTEC) 10 MG chewable tablet 10 mg daily.    [provider]  fluticasone (FLONASE) 50 MCG/ACT nasal spray Place 2 sprays into both nostrils daily. 03/03/23   [provider]  Prenatal Vit-Fe Fumarate-FA (MULTIVITAMIN-PRENATAL) 27-0.8 MG TABS tablet Take 1 tablet by mouth daily at 12 noon.    [provider]  valACYclovir (VALTREX) 500 MG tablet Take 1 tablet (500 mg total) by mouth daily. 08/28/20  Michiel Cowboy A, PA-C  ARIPiprazole (ABILIFY) 10 MG tablet Take 15 mg by mouth daily.   06/06/20  [provider]    Family History Family History  Problem Relation Age of Onset   Hypertension Mother    Hyperlipidemia Mother    Healthy Father    Pancreatic cancer Maternal Uncle    Stroke Maternal Grandfather    Cancer Maternal Grandfather    Lupus Paternal Aunt    GER disease Paternal Grandmother    Kidney cancer Neg Hx     Social History Social History   Tobacco Use   Smoking status: Former   Smokeless tobacco: Never  Advertising account planner   Vaping status: Never Used  Substance Use Topics   Alcohol use: Not Currently    Alcohol/week: 0.0 standard drinks of alcohol    Comment: occasionally   Drug use: No     Allergies   Amoxicillin   Review of  Systems Review of Systems  Constitutional:  Negative for fever.  HENT:  Positive for congestion, rhinorrhea and sore throat. Negative for ear pain.   Respiratory:  Positive for cough. Negative for shortness of breath and wheezing.   Genitourinary:  Positive for dysuria, frequency and urgency. Negative for hematuria, vaginal discharge and vaginal pain.  Musculoskeletal:  Positive for back pain.     Physical Exam Triage Vital Signs ED Triage Vitals  Encounter Vitals Group     BP      Systolic BP Percentile      Diastolic BP Percentile      Pulse      Resp      Temp      Temp src      SpO2      Weight      Height      Head Circumference      Peak Flow      Pain Score      Pain Loc      Pain Education      Exclude from Growth Chart    No data found.  Updated Vital Signs BP 111/78 (BP Location: Left Arm)   Pulse 96   Temp 98 F (36.7 C) (Oral)   Resp 14   Ht 5\' 3"  (1.6 m)   Wt 152 lb (68.9 kg)   LMP 08/22/2023 (Approximate)   SpO2 97%   Breastfeeding No   BMI 26.93 kg/m   Visual Acuity Right Eye Distance:   Left Eye Distance:   Bilateral Distance:    Right Eye Near:   Left Eye Near:    Bilateral Near:     Physical Exam Vitals and nursing note reviewed.  Constitutional:      Appearance: Normal appearance. She is not ill-appearing.  HENT:     Head: Normocephalic and atraumatic.     Right Ear: Tympanic membrane, ear canal and external ear normal. There is no impacted cerumen.     Left Ear: Tympanic membrane, ear canal and external ear normal. There is no impacted cerumen.     Nose: Congestion and rhinorrhea present.     Comments: Nasal mucosa is erythematous and edematous with scant clear discharge in both nares.    Mouth/Throat:     Mouth: Mucous membranes are moist.     Pharynx: Oropharynx is clear. Posterior oropharyngeal erythema present. No oropharyngeal exudate.     Comments: Tonsillar pillars are 1+ edematous and erythematous but free of exudate.   Posterior pharynx demonstrates erythema and injection with clear postnasal  drip. Cardiovascular:     Rate and Rhythm: Normal rate and regular rhythm.     Pulses: Normal pulses.     Heart sounds: Normal heart sounds. No murmur heard.    No friction rub. No gallop.  Pulmonary:     Effort: Pulmonary effort is normal.     Breath sounds: Normal breath sounds. No wheezing, rhonchi or rales.  Abdominal:     Tenderness: There is no right CVA tenderness or left CVA tenderness.  Musculoskeletal:     Cervical back: Normal range of motion and neck supple. No tenderness.  Lymphadenopathy:     Cervical: No cervical adenopathy.  Skin:    General: Skin is warm and dry.     Capillary Refill: Capillary refill takes less than 2 seconds.     Findings: No rash.  Neurological:     General: No focal deficit present.     Mental Status: She is alert and oriented to person, place, and time.      UC Treatments / Results  Labs (all labs ordered are listed, but only abnormal results are displayed) Labs Reviewed  WET PREP, GENITAL - Abnormal; Notable for the following components:      Result Value   Clue Cells Wet Prep HPF POC PRESENT (*)    WBC, Wet Prep HPF POC >10 (*)    All other components within normal limits  URINALYSIS, W/ REFLEX TO CULTURE (INFECTION SUSPECTED) - Abnormal; Notable for the following components:   Bacteria, UA RARE (*)    All other components within normal limits  GROUP A STREP BY PCR    EKG   Radiology No results found.  Procedures Procedures (including critical care time)  Medications Ordered in UC Medications - No data to display  Initial Impression / Assessment and Plan / UC Course  I have reviewed the triage vital signs and the nursing notes.  Pertinent labs & imaging results that were available during my care of the patient were reviewed by me and considered in my medical decision making (see chart for details).   Patient is a nontoxic-appearing 27 year old  female presenting for evaluation of respiratory and urinary symptoms as outlined HPI above.  She does have inflamed nasal mucosa with clear rhinorrhea and clear postnasal drip but she also is edematous and erythematous tonsillar pillars.  She is complaining of a sore throat so I will order a strep PCR.  Cardiopulmonary exam is benign.  No CVA tenderness on exam.  Urinalysis and wet prep were collected at triage and are pending.  PCR is negative.  Urinalysis is negative for leukocyte esterase, nitrates, or protein.  Also no hemoglobin.  Reflex microscopy does not show any white or red blood cells.  Vaginal wet prep is positive for clue cells.  I will discharge patient home with a diagnosis of viral URI with a cough and bacterial vaginosis.  I will start her on metronidazole 500 mg twice daily for 7 days for treatment of her bacterial vaginosis and prescribed Atrovent nasal spray, Tessalon Perles, Promethazine DM cough syrup for patient's cough and congestion.   Final Clinical Impressions(s) / UC Diagnoses   Final diagnoses:  Viral URI with cough  BV (bacterial vaginosis)     Discharge Instructions      Take the Flagyl (metronidazole) 500 mg twice daily for treatment of your bacterial vaginosis.  Avoid alcohol while on the metronidazole as taken together will cause of vomiting.  Bacterial vaginosis is often caused by a imbalance of  bacteria in your vaginal vault.  This is sometimes a result of using tampons or hormonal fluctuations during her menstrual cycle.  You if your symptoms are recurrent you can try using a boric acid suppository twice weekly to help maintain the acid-base balance in your vagina vault which could prevent further infection.  You can also try vaginal probiotics to help return normal bacterial balance.   You tested negative for strep throat.  I do feel you have a viral upper respiratory infection which is causing your respiratory symptoms.  Use over-the-counter  Tylenol and/or ibuprofen according the package instructions as needed for any fever or pain.  Use the Atrovent nasal spray, 2 squirts in each nostril every 6 hours, as needed for runny nose and postnasal drip.  Use the Tessalon Perles every 8 hours during the day.  Take them with a small sip of water.  They may give you some numbness to the base of your tongue or a metallic taste in your mouth, this is normal.  Use the Promethazine DM cough syrup at bedtime for cough and congestion.  It will make you drowsy so do not take it during the day.  Return for reevaluation or see your primary care provider for any new or worsening symptoms.      ED Prescriptions     Medication Sig Dispense Auth. Provider   metroNIDAZOLE (FLAGYL) 500 MG tablet Take 1 tablet (500 mg total) by mouth 2 (two) times daily. 14 tablet Becky Augusta, NP   benzonatate (TESSALON) 100 MG capsule Take 2 capsules (200 mg total) by mouth every 8 (eight) hours. 21 capsule Becky Augusta, NP   ipratropium (ATROVENT) 0.06 % nasal spray Place 2 sprays into both nostrils 4 (four) times daily. 15 mL Becky Augusta, NP   promethazine-dextromethorphan (PROMETHAZINE-DM) 6.25-15 MG/5ML syrup Take 5 mLs by mouth 4 (four) times daily as needed. 118 mL Becky Augusta, NP      PDMP not reviewed this encounter.   Becky Augusta, NP 09/12/23 1212

## 2023-09-12 NOTE — ED Triage Notes (Signed)
Patient c/o urinary frequency and lower back pain for a week.  Patient also reports cough and chest congestion for 3 days.  Patient denies fevers.

## 2023-09-28 DIAGNOSIS — Z419 Encounter for procedure for purposes other than remedying health state, unspecified: Secondary | ICD-10-CM | POA: Diagnosis not present

## 2023-10-15 DIAGNOSIS — F32A Depression, unspecified: Secondary | ICD-10-CM | POA: Diagnosis not present

## 2023-10-29 DIAGNOSIS — Z419 Encounter for procedure for purposes other than remedying health state, unspecified: Secondary | ICD-10-CM | POA: Diagnosis not present

## 2023-11-29 DIAGNOSIS — Z419 Encounter for procedure for purposes other than remedying health state, unspecified: Secondary | ICD-10-CM | POA: Diagnosis not present

## 2023-12-27 DIAGNOSIS — Z419 Encounter for procedure for purposes other than remedying health state, unspecified: Secondary | ICD-10-CM | POA: Diagnosis not present

## 2024-01-10 ENCOUNTER — Ambulatory Visit
Admission: EM | Admit: 2024-01-10 | Discharge: 2024-01-10 | Disposition: A | Discharge status: Home / Self Care | Attending: Physician Assistant | Admitting: Physician Assistant

## 2024-01-10 ENCOUNTER — Encounter: Payer: Self-pay | Admitting: Emergency Medicine

## 2024-01-10 DIAGNOSIS — N3000 Acute cystitis without hematuria: Secondary | ICD-10-CM | POA: Diagnosis not present

## 2024-01-10 LAB — URINALYSIS, W/ REFLEX TO CULTURE (INFECTION SUSPECTED)
Bilirubin Urine: NEGATIVE
Glucose, UA: 100 mg/dL — AB
Ketones, ur: NEGATIVE mg/dL
Nitrite: POSITIVE — AB
Protein, ur: NEGATIVE mg/dL
Specific Gravity, Urine: 1.01 (ref 1.005–1.030)
WBC, UA: >50 WBC/hpf (ref 0–5)
pH: 6.5 (ref 5.0–8.0)

## 2024-01-10 LAB — PREGNANCY, URINE: Preg Test, Ur: NEGATIVE

## 2024-01-10 MED ORDER — NITROFURANTOIN MONOHYD MACRO 100 MG PO CAPS: 100.0000 mg | ORAL_CAPSULE | Freq: Two times a day (BID) | ORAL | 0 refills | Status: DC

## 2024-01-10 NOTE — Discharge Instructions (Addendum)
 Your urine pregnancy test was negative.  You have a UTI.  Start Macrobid twice daily for 5 days.  Make sure you are drinking plenty of fluid.  You can continue Azo for pain relief.  We will contact you if need to change or stop her antibiotics based on her culture results.  If anything worsens you have increasing pain, fever, blood in your urine, nausea/vomiting interfere with oral intake, flank pain you need to go to the emergency room.

## 2024-01-10 NOTE — ED Triage Notes (Signed)
 Patient c/o burning when urinating that started this morning.  Patient reports some urgency.

## 2024-01-10 NOTE — ED Provider Notes (Signed)
 [facility]    CSN: [id number] Arrival date & time: 01/10/24  0957      History   Chief Complaint Chief Complaint  Patient presents with   Dysuria    HPI Chelsea Parks is a 28 y.o. female.   Patient presents today with a several hour history of UTI symptoms.  Reports that she woke up suddenly at 8 AM with dysuria, lower abdominal pain, back pain, frequency, urgency.  She denies any fever, nausea, vomiting, pelvic pain, abnormal discharge.  She has no concern for pregnancy but is open to testing.  She denies history of recurrent UTI or nephrolithiasis.  Denies any recent urogenital procedure catheterization.  She did take Azo, cranberry juice, pushing fluids but this has not provided any relief of symptoms.  Denies history of diabetes does not take SGLT2 inhibitor.  She reports that pain is rated 8 on a 0-10 pain scale, described as burning, no aggravating or relieving factors identified.    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 Authorizing Provider   nitrofurantoin, macrocrystal-monohydrate, (MACROBID) 100 MG capsule Take 1 capsule (100 mg total) by mouth 2 (two) times daily. 01/10/24  Yes Linkyn Gobin K, PA-C  Prenatal Vit-Fe Fumarate-FA (MULTIVITAMIN-PRENATAL) 27-0.8 MG TABS tablet Take 1 tablet by mouth daily at 12 noon.   Yes [provider]  albuterol (VENTOLIN HFA) 108 (90 Base) MCG/ACT inhaler Inhale 2 puffs into the lungs every 6 (six) hours as needed for wheezing or shortness of breath. 09/03/22   Cannady, Corrie Dandy T, NP  cetirizine (ZYRTEC) 10 MG chewable tablet 10 mg daily.    [provider]  fluticasone (FLONASE) 50 MCG/ACT nasal spray Place 2 sprays into both nostrils daily. 03/03/23   [provider]  ipratropium (ATROVENT) 0.06 % nasal spray Place 2 sprays into both nostrils 4 (four) times daily. 09/12/23   Becky Augusta, NP  valACYclovir (VALTREX) 500 MG tablet Take 1 tablet (500 mg total) by mouth daily. 08/28/20   Michiel Cowboy A, PA-C  ARIPiprazole (ABILIFY) 10 MG tablet Take 15 mg by mouth daily.   06/06/20  [provider]    Family History Family History  Problem Relation Age of Onset   Hypertension  Mother    Hyperlipidemia Mother    Healthy Father    Pancreatic cancer Maternal Uncle    Stroke Maternal Grandfather    Cancer Maternal Grandfather    Lupus Paternal Aunt    GER disease Paternal Grandmother    Kidney cancer Neg Hx     Social History Social History   Tobacco Use   Smoking status: Former   Smokeless tobacco: Never  Advertising account planner   Vaping status: Never Used  Substance Use Topics   Alcohol use: Not Currently    Alcohol/week: 0.0 standard drinks of alcohol    Comment: occasionally   Drug use: No     Allergies   Amoxicillin   Review of Systems Review of Systems  Constitutional:  Positive for activity change. Negative for appetite change, fatigue and fever.  HENT:  Negative for congestion.   Gastrointestinal:  Positive for abdominal pain. Negative for  diarrhea, nausea and vomiting.  Genitourinary:  Positive for dysuria, frequency and urgency. Negative for flank pain, hematuria, vaginal bleeding, vaginal discharge and vaginal pain.  Musculoskeletal:  Positive for back pain. Negative for arthralgias and myalgias.     Physical Exam Triage Vital Signs ED Triage Vitals  Encounter Vitals Group     BP 01/10/24 1043 100/63     Systolic BP Percentile --      Diastolic BP Percentile --      Pulse Rate 01/10/24 1043 89     Resp 01/10/24 1043 14     Temp 01/10/24 1043 98.5 F (36.9 C)     Temp Source 01/10/24 1043 Oral     SpO2 01/10/24 1043 97 %     Weight 01/10/24 1042 151 lb 14.4 oz (68.9 kg)     Height 01/10/24 1042 5\' 3"  (1.6 m)     Head Circumference --      Peak Flow --      Pain Score 01/10/24 1042 8     Pain Loc --      Pain Education --      Exclude from Growth Chart --    No data found.  Updated Vital Signs BP 100/63 (BP Location: Right Arm)   Pulse 89   Temp 98.5 F (36.9 C) (Oral)   Resp 14   Ht 5\' 3"  (1.6 m)   Wt 151 lb 14.4 oz (68.9 kg)   LMP 01/03/2024 (Approximate)   SpO2 97%   BMI 26.91 kg/m   Visual Acuity Right Eye Distance:   Left Eye Distance:   Bilateral Distance:    Right Eye Near:   Left Eye Near:    Bilateral Near:     Physical Exam Vitals reviewed.  Constitutional:      General: She is awake. She is not in acute distress.    Appearance: Normal appearance. She is well-developed. She is not ill-appearing.     Comments: Very pleasant female appears stated age in no acute distress sitting comfortably in exam room  HENT:     Head: Normocephalic and atraumatic.  Cardiovascular:     Rate and Rhythm: Normal rate and regular rhythm.     Heart sounds: Normal heart sounds, S1 normal and S2 normal. No murmur heard. Pulmonary:     Effort: Pulmonary effort is normal.     Breath sounds: Normal breath sounds. No wheezing, rhonchi or rales.     Comments: Clear to auscultation bilaterally Abdominal:      General: Bowel sounds are normal.     Palpations: Abdomen is  soft.     Tenderness: There is no abdominal tenderness. There is no right CVA tenderness, left CVA tenderness, guarding or rebound.  Psychiatric:        Behavior: Behavior is cooperative.      UC Treatments / Results  Labs (all labs ordered are listed, but only abnormal results are displayed) Labs Reviewed  URINALYSIS, W/ REFLEX TO CULTURE (INFECTION SUSPECTED) - Abnormal; Notable for the following components:      Result Value   Color, Urine ORANGE (*)    Glucose, UA 100 (*)    Hgb urine dipstick TRACE (*)    Nitrite POSITIVE (*)    Leukocytes,Ua SMALL (*)    Bacteria, UA MANY (*)    All other components within normal limits  URINE CULTURE  PREGNANCY, URINE    EKG   Radiology No results found.  Procedures Procedures (including critical care time)  Medications Ordered in UC Medications - No data to display  Initial Impression / Assessment and Plan / UC Course  I have reviewed the triage vital signs and the nursing notes.  Pertinent labs & imaging results that were available during my care of the patient were reviewed by me and considered in my medical decision making (see chart for details).     Patient is well-appearing, afebrile, nontoxic, nontachycardic.  Urine pregnancy was negative in clinic.  Urine concerning for UTI with bacteria noted on microscopy.  Will start Macrobid twice daily for 5 days.  She was encouraged to push fluids and use over-the-counter medications for pain relief.  Will send her urine for culture and we will contact her if any to change or discontinue her antibiotics based on culture results.  Discussed that if anything worsens and she has hematuria, fever, abdominal pain, nausea/vomiting, flank pain she is to be seen emergently.  Strict turn precautions given.  Excuse note provided.  Final Clinical Impressions(s) / UC Diagnoses   Final diagnoses:  Acute cystitis without  hematuria     Discharge Instructions      Your urine pregnancy test was negative.  You have a UTI.  Start Macrobid twice daily for 5 days.  Make sure you are drinking plenty of fluid.  You can continue Azo for pain relief.  We will contact you if need to change or stop her antibiotics based on her culture results.  If anything worsens you have increasing pain, fever, blood in your urine, nausea/vomiting interfere with oral intake, flank pain you need to go to the emergency room.     ED Prescriptions     Medication Sig Dispense Auth. Provider   nitrofurantoin, macrocrystal-monohydrate, (MACROBID) 100 MG capsule Take 1 capsule (100 mg total) by mouth 2 (two) times daily. 10 capsule Kiaira Pointer, Noberto Retort, PA-C      PDMP not reviewed this encounter.   Jeani Hawking, PA-C 01/10/24 1136

## 2024-01-11 LAB — URINE CULTURE: Culture: NO GROWTH

## 2024-01-19 NOTE — Progress Notes (Unsigned)
    GYNECOLOGY PROGRESS NOTE  Subjective:    Patient ID: Clois Dupes, female    DOB: 20-Nov-1995, 28 y.o.   MRN: [id number]  HPI  Patient is a 28 y.o. [id number] female who presents for evaluation of UTI symptoms.   {Common ambulatory SmartLinks:19316}  Review of Systems {ros; complete:30496}   Objective:   Last menstrual period 01/03/2024. There is no height or weight on file to calculate BMI. General appearance: {general exam:16600} Abdomen: {abdominal exam:16834} Pelvic: {pelvic exam:16852::"cervix normal in appearance","external genitalia normal","no adnexal masses or tenderness","no cervical motion tenderness","rectovaginal septum normal","uterus normal size, shape, and consistency","vagina normal without discharge"} Extremities: {extremity exam:5109} Neurologic: {neuro exam:17854}   Assessment:   No diagnosis found.   Plan:   There are no diagnoses linked to this encounter.     Hildred Laser, MD [facility]

## 2024-01-20 ENCOUNTER — Other Ambulatory Visit (HOSPITAL_COMMUNITY)
Admission: RE | Admit: 2024-01-20 | Discharge: 2024-01-20 | Disposition: A | Discharge status: Home / Self Care | Source: Ambulatory Visit | Attending: Obstetrics and Gynecology | Admitting: Obstetrics and Gynecology

## 2024-01-20 ENCOUNTER — Ambulatory Visit: Admitting: Obstetrics and Gynecology

## 2024-01-20 VITALS — BP 122/73 | HR 96 | Ht 63.0 in | Wt 141.5 lb

## 2024-01-20 DIAGNOSIS — Z8619 Personal history of other infectious and parasitic diseases: Secondary | ICD-10-CM

## 2024-01-20 DIAGNOSIS — R350 Frequency of micturition: Secondary | ICD-10-CM

## 2024-01-20 DIAGNOSIS — B009 Herpesviral infection, unspecified: Secondary | ICD-10-CM

## 2024-01-20 DIAGNOSIS — Z113 Encounter for screening for infections with a predominantly sexual mode of transmission: Secondary | ICD-10-CM | POA: Insufficient documentation

## 2024-01-20 DIAGNOSIS — Z3009 Encounter for other general counseling and advice on contraception: Secondary | ICD-10-CM | POA: Diagnosis not present

## 2024-01-20 DIAGNOSIS — Z30430 Encounter for insertion of intrauterine contraceptive device: Secondary | ICD-10-CM | POA: Diagnosis not present

## 2024-01-20 DIAGNOSIS — R399 Unspecified symptoms and signs involving the genitourinary system: Secondary | ICD-10-CM | POA: Diagnosis not present

## 2024-01-20 MED ORDER — LEVONORGESTREL 20 MCG/DAY IU IUD
1.0000 | INTRAUTERINE_SYSTEM | Freq: Once | INTRAUTERINE | Status: AC
  Administered 2024-01-20: 1 via INTRAUTERINE

## 2024-01-20 MED ORDER — VALACYCLOVIR HCL 500 MG PO TABS: 500.0000 mg | ORAL_TABLET | Freq: Every day | ORAL | 3 refills | Status: AC

## 2024-01-20 NOTE — Patient Instructions (Signed)
 IUD PLACEMENT POST-PROCEDURE INSTRUCTIONS  You may take Ibuprofen, Aleve or Tylenol for pain if needed.  Cramping should resolve within in 24 hours.  You may have a small amount of spotting.  You should wear a mini pad for the next few days.  You may have intercourse after 24 hours.  If you using this for birth control, it is effective immediately.  You need to call if you have any pelvic pain, fever, heavy bleeding or foul smelling vaginal discharge.  Irregular bleeding is common the first several months after having an IUD placed. You do not need to call for this reason unless you are concerned.  Shower or bathe as normal  You should have a follow-up appointment in 4-8 weeks for a re-check to make sure you are not having any problems.    Hormonal Birth Control (Hormonal Contraception): What to Know Hormonal birth control, also called hormonal contraception, is a type of birth control that uses chemicals called hormones to prevent pregnancy. It may include a combination of the hormones estrogen and progesterone, or only the hormone progesterone. Hormonal birth control works in these ways: It thickens the mucus in the cervix, which is the lowest part of the uterus. Thicker mucus makes it harder for sperm to get into the uterus. It changes the lining of the uterus. This makes it harder for an egg to attach or implant. It may stop the ovaries from releasing eggs, called ovulation. Some people who take hormonal birth control that contains only progesterone may continue to ovulate. Hormonal birth control doesn't protect against sexually transmitted infections (STIs). Pregnancy may still happen. Types of hormonal birth control  Estrogen and progesterone birth control Birth control that use a combination of estrogen and progesterone is available as: Pills that come in different combinations of hormones. Pills must be taken at the same time each day. They can affect your period. You can get your  period monthly, once every 3 months, or not at all. A patch that is applied to the butt, belly, upper outer arm, or back. It's kept in place for 3 weeks. It's taken off for the last or fourth week of the menstrual cycle. A vaginal ring. The ring is placed in the vagina and left there for 3 weeks. It's then taken off for the last or fourth week of the cycle. Progesterone-only birth control Birth control that uses only progesterone is available as: Pills. These should be taken at the same time every day. This is very important to decrease the chance of pregnancy. Pills containing progestin-only are usually taken every day of the cycle. Other types of pills may have an inactive pill for the last 4 days of every cycle. Intrauterine device (IUD). This device is inserted through the vagina and cervix into the uterus. It's taken out or replaced every 3 to 8 years, depending on the type. It can be taken out sooner. Implant. A plastic rod is placed under the skin of the upper arm. It is taken out or replaced every 3 years. It can be taken out sooner. Shot, also called injection. The shot is given once every 12 to 14 weeks. Risks associated with hormonal birth control Estrogen and progesterone birth control can sometimes cause side effects, such as: Feeling like you may throw up. Headaches. Breast tenderness. Bleeding or spotting between menstrual cycles. High blood pressure. This is rare. Strokes, heart attacks, or blood clots. These are rare. Progesterone-only birth control can sometimes have side effects, such as: Feeling  like you may throw up. Headaches. Breast tenderness. Irregular menstrual bleeding. High blood pressure. This is rare. Talk to your health care provider about what side effects may mean for you. Questions to ask: What type of hormonal birth control is right for me? How long should I plan to use hormonal birth control? What are the side effects of the hormonal birth control method  I choose? How can I prevent STIs while using hormonal birth control? Where to find more information Ask your provider for more information and resources about hormonal birth control. You can also go to: [place] Department of Health and [employer], Office on Women's Health: http://hoffman.com/ This information is not intended to replace advice given to you by your health care provider. Make sure you discuss any questions you have with your health care provider. Document Revised: 04/28/2023 Document Reviewed: 04/28/2023 Elsevier Patient Education  2024 [employer].

## 2024-01-21 ENCOUNTER — Encounter: Payer: Self-pay | Admitting: Obstetrics and Gynecology

## 2024-01-21 LAB — CERVICOVAGINAL ANCILLARY ONLY
Bacterial Vaginitis (gardnerella): NEGATIVE
Candida Glabrata: NEGATIVE
Candida Vaginitis: NEGATIVE
Chlamydia: NEGATIVE
Comment: NEGATIVE
Comment: NEGATIVE
Comment: NEGATIVE
Comment: NEGATIVE
Comment: NEGATIVE
Comment: NORMAL
Neisseria Gonorrhea: NEGATIVE
Trichomonas: NEGATIVE

## 2024-02-05 ENCOUNTER — Ambulatory Visit
Admission: EM | Admit: 2024-02-05 | Discharge: 2024-02-05 | Disposition: A | Discharge status: Home / Self Care | Attending: Family Medicine | Admitting: Family Medicine

## 2024-02-05 DIAGNOSIS — R197 Diarrhea, unspecified: Secondary | ICD-10-CM | POA: Insufficient documentation

## 2024-02-05 DIAGNOSIS — J4520 Mild intermittent asthma, uncomplicated: Secondary | ICD-10-CM | POA: Diagnosis not present

## 2024-02-05 DIAGNOSIS — J069 Acute upper respiratory infection, unspecified: Secondary | ICD-10-CM | POA: Diagnosis not present

## 2024-02-05 LAB — RESP PANEL BY RT-PCR (FLU A&B, COVID) ARPGX2
Influenza A by PCR: NEGATIVE
Influenza B by PCR: NEGATIVE
SARS Coronavirus 2 by RT PCR: NEGATIVE

## 2024-02-05 MED ORDER — ALBUTEROL SULFATE HFA 108 (90 BASE) MCG/ACT IN AERS: 2.0000 | INHALATION_SPRAY | RESPIRATORY_TRACT | 2 refills | Status: AC | PRN

## 2024-02-05 NOTE — ED Provider Notes (Signed)
 [facility]    CSN: [id number] Arrival date & time: 02/05/24  1137      History   Chief Complaint Chief Complaint  Patient presents with   Nasal Congestion   Generalized Body Aches    HPI SHERRYN POLLINO is a 28 y.o. female.   HPI  History obtained from the patient. Austina presents for rhinorrhea, diarrhea, sore throat, nausea, loss of voice, low grade fever, headache, fatigue and nasal congestion that started on Monday. Taking Zyrtec, Benadryl, Mucinex, and ibuprofen. Last took last night. Denies vomiting. Endorses slight cough.  Her son is sick with similar sx.    Has exercise induced asthma. Denies vaping and smoking.       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 Authorizing Provider  albuterol (VENTOLIN HFA) 108 (90 Base) MCG/ACT inhaler Inhale 2 puffs into the lungs every 6 (six) hours as needed for wheezing or shortness of breath. 09/03/22  Yes Cannady, Jolene T, NP  cetirizine (ZYRTEC) 10 MG chewable tablet 10 mg daily.   Yes [provider]  fluticasone (FLONASE) 50 MCG/ACT nasal spray Place 2 sprays into both nostrils daily. 03/03/23  Yes [provider]  ipratropium (ATROVENT) 0.06 % nasal spray Place 2 sprays into both nostrils 4 (four) times daily. 09/12/23  Yes Becky Augusta, NP  Prenatal Vit-Fe Fumarate-FA (MULTIVITAMIN-PRENATAL) 27-0.8 MG TABS tablet Take 1 tablet by mouth daily at 12 noon.   Yes [provider]  valACYclovir (VALTREX) 500 MG tablet Take 1 tablet (500 mg total) by mouth daily. 01/20/24  Yes Hildred Laser, MD  nitrofurantoin, macrocrystal-monohydrate, (MACROBID) 100 MG capsule Take 1 capsule (100 mg total) by mouth 2 (two) times daily. 01/10/24   Raspet, Erin K, PA-C  ARIPiprazole (ABILIFY) 10 MG tablet Take 15 mg by mouth daily.   06/06/20  [provider]    Family History Family History  Problem Relation Age of Onset   Hypertension Mother    Hyperlipidemia Mother    Healthy Father    Pancreatic cancer Maternal Uncle    Stroke Maternal Grandfather    Cancer Maternal Grandfather    Lupus Paternal Aunt    GER disease Paternal Grandmother    Kidney  cancer Neg Hx     Social History Social History   Tobacco Use   Smoking status: Former   Smokeless tobacco: Never  Advertising account planner   Vaping status: Never Used  Substance Use Topics   Alcohol use: Not Currently    Alcohol/week: 0.0 standard drinks of alcohol    Comment: occasionally   Drug use: No     Allergies   Amoxicillin   Review of Systems Review of Systems: negative unless otherwise stated in HPI.      Physical Exam Triage Vital Signs ED Triage Vitals  Encounter Vitals Group     BP 02/05/24 1211 107/74     Systolic BP Percentile --      Diastolic BP Percentile --      Pulse Rate 02/05/24 1211 85     Resp --      Temp 02/05/24 1211 98.3 F (36.8 C)     Temp Source 02/05/24 1211 Oral     SpO2 02/05/24 1211 98 %     Weight 02/05/24 1209 142 lb 14.4 oz (64.8 kg)     Height 02/05/24 1209 5\' 3"  (1.6 m)      Head Circumference --      Peak Flow --      Pain Score 02/05/24 1209 0     Pain Loc --      Pain Education --      Exclude from Growth Chart --    No data found.  Updated Vital Signs BP 107/74 (BP Location: Left Arm)   Pulse 85   Temp 98.3 F (36.8 C) (Oral)   Ht 5\' 3"  (1.6 m)   Wt 64.8 kg   LMP 02/05/2024 (Approximate)   SpO2 98%   BMI 25.31 kg/m   Visual Acuity Right Eye Distance:   Left Eye Distance:   Bilateral Distance:    Right Eye Near:   Left Eye Near:    Bilateral Near:     Physical Exam GEN:     alert, non-toxic appearing female in no distress    HENT:  mucus membranes moist, oropharyngeal without lesions or erythema, no tonsillar hypertrophy or exudates,  hoarse voice, no nasal discharge, bilateral TM normal EYES:   no scleral injection or discharge NECK:  normal ROM, no meningismus   RESP:  no increased work of breathing, clear to auscultation bilaterally CVS:   regular rate and rhythm Skin:   warm and dry, no rash on visible skin    UC Treatments / Results  Labs (all labs ordered are listed, but only abnormal results are displayed) Labs Reviewed  RESP PANEL BY RT-PCR (FLU A&B, COVID) ARPGX2    EKG   Radiology No results found.  Procedures Procedures (including critical care time)  Medications Ordered in UC Medications - No data to display  Initial Impression / Assessment and Plan / UC Course  I have reviewed the triage vital signs and the nursing notes.  Pertinent labs & imaging results that were available during my care of the patient were reviewed by me and considered in my medical decision making (see chart for details).       Pt is a 28 y.o. female who presents for 4 days of respiratory symptoms. Sheli is afebrile here without recent antipyretics. Satting well on room air. Overall pt is non-toxic appearing, well hydrated, without respiratory distress. Pulmonary exam is unremarkable.  COVID and influenza panel obtained and was  negative. History consistent with viral illness. Discussed symptomatic treatment.  Explained lack of efficacy of antibiotics in viral disease.  Typical duration of symptoms discussed.   Asthma: refilled albuterol inhaler   Return and ED precautions given and voiced understanding. Discussed MDM, treatment plan and plan for follow-up with patient who agrees with plan.     Final Clinical Impressions(s) / UC Diagnoses   Final diagnoses:  None   Discharge Instructions   None    ED Prescriptions   None    PDMP not reviewed this encounter.   Katha Cabal, DO 02/05/24 1313

## 2024-02-05 NOTE — ED Triage Notes (Signed)
 Pt c/o nasal congestion, diarrhea, body aches, body chills, sore throat, loss of voice after "talking a lot" on 4.9.25. x5days  Pt has tried OTC zyrtec, liquid mucinex, and benadryl for symtpoms

## 2024-02-05 NOTE — Discharge Instructions (Signed)
 Chelsea Parks, your influenza and COVID are all negative. You have a viral respiratory infection that will gradually improve over the next 7-10 days. Cough may last up to 3 weeks.    You can take Tylenol and/or Ibuprofen as needed for fever reduction and pain relief.    For cough: honey 1/2 to 1 teaspoon (you can dilute the honey in water or another fluid). You can also use guaifenesin and dextromethorphan for cough. You can use a humidifier for chest congestion and cough.  If you don't have a humidifier, you can sit in the bathroom with the hot shower running.      For sore throat: try warm salt water gargles, Mucinex sore throat cough drops or cepacol lozenges, throat spray, warm tea or water with lemon/honey, popsicles or ice, or OTC cold relief medicine for throat discomfort. You can also purchase chloraseptic spray at the pharmacy or dollar store.   For congestion: take a daily anti-histamine like Zyrtec, Claritin, and a oral decongestant, such as pseudoephedrine.  You can also use Flonase 1-2 sprays in each nostril daily. Afrin is also a good option, if you do not have high blood pressure.    It is important to stay hydrated: drink plenty of fluids (water, gatorade/powerade/pedialyte, juices, or teas) to keep your throat moisturized and help further relieve irritation/discomfort.    Return or go to the Emergency Department if symptoms worsen or do not improve in the next few days

## 2024-02-07 DIAGNOSIS — Z419 Encounter for procedure for purposes other than remedying health state, unspecified: Secondary | ICD-10-CM | POA: Diagnosis not present

## 2024-02-20 ENCOUNTER — Ambulatory Visit: Admitting: Obstetrics and Gynecology

## 2024-02-20 DIAGNOSIS — Z01419 Encounter for gynecological examination (general) (routine) without abnormal findings: Secondary | ICD-10-CM

## 2024-02-20 DIAGNOSIS — Z124 Encounter for screening for malignant neoplasm of cervix: Secondary | ICD-10-CM

## 2024-02-20 DIAGNOSIS — Z30431 Encounter for routine checking of intrauterine contraceptive device: Secondary | ICD-10-CM

## 2024-02-23 DIAGNOSIS — G4700 Insomnia, unspecified: Secondary | ICD-10-CM | POA: Diagnosis not present

## 2024-02-26 ENCOUNTER — Encounter: Payer: Self-pay | Admitting: Obstetrics and Gynecology

## 2024-02-27 DIAGNOSIS — F32A Depression, unspecified: Secondary | ICD-10-CM | POA: Diagnosis not present

## 2024-02-27 DIAGNOSIS — R21 Rash and other nonspecific skin eruption: Secondary | ICD-10-CM | POA: Diagnosis not present

## 2024-02-27 DIAGNOSIS — F419 Anxiety disorder, unspecified: Secondary | ICD-10-CM | POA: Diagnosis not present

## 2024-03-08 DIAGNOSIS — Z419 Encounter for procedure for purposes other than remedying health state, unspecified: Secondary | ICD-10-CM | POA: Diagnosis not present

## 2024-04-08 DIAGNOSIS — Z419 Encounter for procedure for purposes other than remedying health state, unspecified: Secondary | ICD-10-CM | POA: Diagnosis not present

## 2024-04-14 ENCOUNTER — Ambulatory Visit

## 2024-04-14 VITALS — BP 108/75 | HR 90 | Temp 97.5°F | Wt 144.1 lb

## 2024-04-14 DIAGNOSIS — R300 Dysuria: Secondary | ICD-10-CM

## 2024-04-14 DIAGNOSIS — R350 Frequency of micturition: Secondary | ICD-10-CM | POA: Diagnosis not present

## 2024-04-14 LAB — POCT URINALYSIS DIPSTICK
Bilirubin, UA: NEGATIVE
Blood, UA: POSITIVE
Glucose, UA: NEGATIVE
Ketones, UA: NEGATIVE
Nitrite, UA: NEGATIVE
Protein, UA: POSITIVE — AB
Spec Grav, UA: 1.01 (ref 1.010–1.025)
Urobilinogen, UA: 0.2 U/dL
pH, UA: 7.5 (ref 5.0–8.0)

## 2024-04-14 MED ORDER — SULFAMETHOXAZOLE-TRIMETHOPRIM 800-160 MG PO TABS: 1.0000 | ORAL_TABLET | Freq: Two times a day (BID) | ORAL | 0 refills | Status: DC

## 2024-04-14 NOTE — Patient Instructions (Signed)

## 2024-04-14 NOTE — Progress Notes (Signed)
    NURSE VISIT NOTE  Subjective:    Patient ID: Chelsea Parks, female    DOB: Aug 07, 1996, 28 y.o.   MRN: [id number]       HPI  Patient is a 28 y.o. G5P3003 female who presents for dysuria, urinary frequency, pelvic pain, and cloudy malordorous urine for 1 week.   Patient does have a history of recurrent UTI.  Patient does not have a history of pyelonephritis.    Objective:    BP 108/75   Pulse 90   Temp (!) 97.5 F (36.4 C)   Wt 144 lb 1.6 oz (65.4 kg)   LMP 04/06/2024 (Approximate)   BMI 25.53 kg/m    Lab Review  Results for orders placed or performed in visit on 04/14/24  POCT Urinalysis Dipstick  Result Value Ref Range   Color, UA     Clarity, UA     Glucose, UA Negative Negative   Bilirubin, UA Negative    Ketones, UA Negative    Spec Grav, UA 1.010 1.010 - 1.025   Blood, UA Positive    pH, UA 7.5 5.0 - 8.0   Protein, UA Positive (A) Negative   Urobilinogen, UA 0.2 0.2 or 1.0 E.U./dL   Nitrite, UA Negative    Leukocytes, UA Large (3+) (A) Negative   Appearance     Odor      Assessment:   1. Urine frequency   2. Dysuria      Plan:   Urine Culture Sent. Treatment  Bactrim  DS 1 PO BID for 7 days.   Aldona Amel, CMA

## 2024-04-15 DIAGNOSIS — G479 Sleep disorder, unspecified: Secondary | ICD-10-CM | POA: Diagnosis not present

## 2024-04-15 DIAGNOSIS — F32A Depression, unspecified: Secondary | ICD-10-CM | POA: Diagnosis not present

## 2024-04-15 DIAGNOSIS — R21 Rash and other nonspecific skin eruption: Secondary | ICD-10-CM | POA: Diagnosis not present

## 2024-04-15 DIAGNOSIS — F419 Anxiety disorder, unspecified: Secondary | ICD-10-CM | POA: Diagnosis not present

## 2024-04-16 ENCOUNTER — Encounter: Payer: Self-pay | Admitting: Certified Nurse Midwife

## 2024-04-16 LAB — URINE CULTURE

## 2024-05-08 DIAGNOSIS — Z419 Encounter for procedure for purposes other than remedying health state, unspecified: Secondary | ICD-10-CM | POA: Diagnosis not present

## 2024-06-08 DIAGNOSIS — Z419 Encounter for procedure for purposes other than remedying health state, unspecified: Secondary | ICD-10-CM | POA: Diagnosis not present

## 2024-06-29 ENCOUNTER — Encounter: Payer: Self-pay | Admitting: Emergency Medicine

## 2024-06-29 ENCOUNTER — Ambulatory Visit
Admission: EM | Admit: 2024-06-29 | Discharge: 2024-06-29 | Disposition: A | Discharge status: Home / Self Care | Attending: Emergency Medicine | Admitting: Emergency Medicine

## 2024-06-29 DIAGNOSIS — N390 Urinary tract infection, site not specified: Secondary | ICD-10-CM | POA: Diagnosis not present

## 2024-06-29 LAB — URINALYSIS, W/ REFLEX TO CULTURE (INFECTION SUSPECTED)

## 2024-06-29 MED ORDER — NITROFURANTOIN MONOHYD MACRO 100 MG PO CAPS: 100.0000 mg | ORAL_CAPSULE | Freq: Two times a day (BID) | ORAL | 0 refills | Status: DC

## 2024-06-29 NOTE — Discharge Instructions (Addendum)
 Take the Macrobid  twice daily for 5 days with food for treatment of urinary tract infection.  Use Azo every 8 hours as needed for urinary discomfort.  This will turn your urine a bright red-orange.  Increase your oral fluid intake so that you increase your urine production and or flushing your urinary system.  Take an over-the-counter probiotic, such as Culturelle-Align-Activia, 1 hour after each dose of antibiotic to prevent diarrhea or yeast infections from forming.  We will culture urine and change the antibiotics if necessary.  Return for reevaluation, or see your primary care provider, for any new or worsening symptoms.

## 2024-06-29 NOTE — ED Triage Notes (Signed)
 Pt presents with dysuria and urinary frequency x 4 days. She has taken AZO for her symptoms with some relief.

## 2024-06-29 NOTE — ED Provider Notes (Signed)
 [facility]    CSN: [id number] Arrival date & time: 06/29/24  1235      History   Chief Complaint Chief Complaint  Patient presents with   Dysuria    HPI Chelsea Parks is a 28 y.o. female.   HPI  28 year old female with past medical history significant for PMDD, pelvic floor dysfunction, high cholesterol, HSV-2, depression, bipolar, and anxiety presents for evaluation of dysuria, urgency, and frequency has been going on for the last 4 days.  She does report some accompanying low back pain but no fever, blood in the urine, vaginal discharge or itching.  Also no abdominal pain.  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 Authorizing Provider  nitrofurantoin , macrocrystal-monohydrate, (MACROBID ) 100 MG capsule Take 1 capsule (100 mg total) by mouth 2 (two) times daily. 06/29/24  Yes Bernardino Ditch, NP  albuterol  (VENTOLIN  HFA) 108 (90 Base) MCG/ACT inhaler Inhale 2 puffs into the lungs every 4 (four) hours as needed for wheezing or shortness of breath. 02/05/24   Brimage,  Vondra, DO  cetirizine (ZYRTEC) 10 MG chewable tablet 10 mg daily.    [provider]  fluticasone (FLONASE) 50 MCG/ACT nasal spray Place 2 sprays into both nostrils daily. 03/03/23   [provider]  ipratropium (ATROVENT ) 0.06 % nasal spray Place 2 sprays into both nostrils 4 (four) times daily. 09/12/23   Bernardino Ditch, NP  Prenatal Vit-Fe Fumarate-FA (MULTIVITAMIN-PRENATAL) 27-0.8 MG TABS tablet Take 1 tablet by mouth daily at 12 noon.    [provider]  sulfamethoxazole -trimethoprim  (BACTRIM  DS) 800-160 MG tablet Take 1 tablet by mouth 2 (two) times daily. 04/14/24   Sebastian Sham, CNM  valACYclovir  (VALTREX ) 500 MG tablet Take 1 tablet (500 mg total) by mouth daily. 01/20/24   Connell Davies, MD  ARIPiprazole (ABILIFY) 10 MG tablet Take 15 mg by mouth daily.   06/06/20  [provider]    Family History Family History  Problem Relation Age of Onset   Hypertension Mother    Hyperlipidemia Mother    Healthy Father    Pancreatic cancer Maternal Uncle    Stroke Maternal Grandfather    Cancer Maternal Grandfather    Lupus Paternal Aunt  GER disease Paternal Grandmother    Kidney cancer Neg Hx     Social History Social History   Tobacco Use   Smoking status: Former   Smokeless tobacco: Never  Advertising account planner   Vaping status: Never Used  Substance Use Topics   Alcohol use: Not Currently    Alcohol/week: 0.0 standard drinks of alcohol    Comment: occasionally   Drug use: No     Allergies   Amoxicillin   Review of Systems Review of Systems  Constitutional:  Negative for fever.  Gastrointestinal:  Negative for abdominal pain.  Genitourinary:  Positive for dysuria and frequency. Negative for hematuria, vaginal discharge and vaginal pain.  Musculoskeletal:  Positive for back pain.     Physical Exam Triage Vital Signs ED Triage Vitals  Encounter Vitals Group     BP      Girls Systolic BP Percentile      Girls Diastolic BP Percentile       Boys Systolic BP Percentile      Boys Diastolic BP Percentile      Pulse      Resp      Temp      Temp src      SpO2      Weight      Height      Head Circumference      Peak Flow      Pain Score      Pain Loc      Pain Education      Exclude from Growth Chart    No data found.  Updated Vital Signs BP 116/76 (BP Location: Left Arm)   Pulse 80   Temp 98.5 F (36.9 C) (Oral)   Resp 18   Wt 140 lb (63.5 kg)   LMP  (LMP Unknown)   SpO2 95%   BMI 24.80 kg/m   Visual Acuity Right Eye Distance:   Left Eye Distance:   Bilateral Distance:    Right Eye Near:   Left Eye Near:    Bilateral Near:     Physical Exam Vitals and nursing note reviewed.  Constitutional:      Appearance: Normal appearance. She is not ill-appearing.  HENT:     Head: Normocephalic and atraumatic.  Cardiovascular:     Rate and Rhythm: Normal rate and regular rhythm.     Pulses: Normal pulses.     Heart sounds: Normal heart sounds. No murmur heard.    No friction rub. No gallop.  Pulmonary:     Effort: Pulmonary effort is normal.     Breath sounds: Normal breath sounds. No wheezing, rhonchi or rales.  Abdominal:     Tenderness: There is no right CVA tenderness or left CVA tenderness.  Skin:    General: Skin is warm and dry.     Capillary Refill: Capillary refill takes less than 2 seconds.     Findings: No rash.  Neurological:     General: No focal deficit present.     Mental Status: She is alert and oriented to person, place, and time.      UC Treatments / Results  Labs (all labs ordered are listed, but only abnormal results are displayed) Labs Reviewed  URINALYSIS, W/ REFLEX TO CULTURE (INFECTION SUSPECTED) - Abnormal; Notable for the following components:      Result Value   Color, Urine ORANGE (*)    APPearance HAZY (*)    Glucose, UA   (*)    Value:  TEST NOT REPORTED DUE TO COLOR INTERFERENCE OF URINE PIGMENT   Hgb urine dipstick   (*)    Value: TEST NOT REPORTED DUE TO  COLOR INTERFERENCE OF URINE PIGMENT   Bilirubin Urine   (*)    Value: TEST NOT REPORTED DUE TO COLOR INTERFERENCE OF URINE PIGMENT   Ketones, ur   (*)    Value: TEST NOT REPORTED DUE TO COLOR INTERFERENCE OF URINE PIGMENT   Protein, ur   (*)    Value: TEST NOT REPORTED DUE TO COLOR INTERFERENCE OF URINE PIGMENT   Nitrite   (*)    Value: TEST NOT REPORTED DUE TO COLOR INTERFERENCE OF URINE PIGMENT   Leukocytes,Ua   (*)    Value: TEST NOT REPORTED DUE TO COLOR INTERFERENCE OF URINE PIGMENT   Bacteria, UA FEW (*)    All other components within normal limits  URINE CULTURE    EKG   Radiology No results found.  Procedures Procedures (including critical care time)  Medications Ordered in UC Medications - No data to display  Initial Impression / Assessment and Plan / UC Course  I have reviewed the triage vital signs and the nursing notes.  Pertinent labs & imaging results that were available during my care of the patient were reviewed by me and considered in my medical decision making (see chart for details).   Patient is a pleasant, nontoxic-appearing 28 year old female presenting for evaluation of UTI symptoms as outlined HPI above.  In the exam room, the patient is in any acute distress.  She has no CVA tenderness on exam and she is denying any abdominal pain or suprapubic tenderness.  She also denies any vaginal discharge or itching.  I will order a urinalysis to assess for the presence of UTI.  Urinalysis shows significant colorimetric interference.  The reflex microscopy shows 6-10 WBCs with few bacteria.  I will send urine for culture.  I will discharge patient with diagnosis of UTI start her on Macrobid  100 mg twice daily for 5 days for treatment of UTI.  She is using over-the-counter Azo and she may continue to use that as needed for urinary discomfort.  Return precautions reviewed.   Final Clinical Impressions(s) / UC Diagnoses   Final diagnoses:  Lower urinary tract  infectious disease     Discharge Instructions      Take the Macrobid  twice daily for 5 days with food for treatment of urinary tract infection.  Use Azo every 8 hours as needed for urinary discomfort.  This will turn your urine a bright red-orange.  Increase your oral fluid intake so that you increase your urine production and or flushing your urinary system.  Take an over-the-counter probiotic, such as Culturelle-Align-Activia, 1 hour after each dose of antibiotic to prevent diarrhea or yeast infections from forming.  We will culture urine and change the antibiotics if necessary.  Return for reevaluation, or see your primary care provider, for any new or worsening symptoms.      ED Prescriptions     Medication Sig Dispense Auth. Provider   nitrofurantoin , macrocrystal-monohydrate, (MACROBID ) 100 MG capsule Take 1 capsule (100 mg total) by mouth 2 (two) times daily. 10 capsule Bernardino Ditch, NP      PDMP not reviewed this encounter.   Bernardino Ditch, NP 06/29/24 1347

## 2024-06-30 ENCOUNTER — Ambulatory Visit (HOSPITAL_COMMUNITY): Payer: Self-pay

## 2024-06-30 LAB — URINE CULTURE
Culture: <10000 — AB
Special Requests: NORMAL

## 2024-07-09 DIAGNOSIS — Z419 Encounter for procedure for purposes other than remedying health state, unspecified: Secondary | ICD-10-CM | POA: Diagnosis not present

## 2024-07-12 DIAGNOSIS — J45909 Unspecified asthma, uncomplicated: Secondary | ICD-10-CM | POA: Diagnosis not present

## 2024-07-12 DIAGNOSIS — R5383 Other fatigue: Secondary | ICD-10-CM | POA: Diagnosis not present

## 2024-07-12 DIAGNOSIS — F32A Depression, unspecified: Secondary | ICD-10-CM | POA: Diagnosis not present

## 2024-07-12 DIAGNOSIS — F4310 Post-traumatic stress disorder, unspecified: Secondary | ICD-10-CM | POA: Diagnosis not present

## 2024-07-12 DIAGNOSIS — R634 Abnormal weight loss: Secondary | ICD-10-CM | POA: Diagnosis not present

## 2024-07-12 DIAGNOSIS — J4540 Moderate persistent asthma, uncomplicated: Secondary | ICD-10-CM | POA: Diagnosis not present

## 2024-08-08 DIAGNOSIS — Z419 Encounter for procedure for purposes other than remedying health state, unspecified: Secondary | ICD-10-CM | POA: Diagnosis not present

## 2024-08-12 ENCOUNTER — Telehealth: Payer: Self-pay | Admitting: Licensed Clinical Social Worker

## 2024-08-12 NOTE — Telephone Encounter (Signed)
 Patient called to inquire about mental health services. LCSW discussed in-person and virtual services. Instructed patient to come into register as a patient and call LCSW to schedule, if she desires virtual.

## 2024-09-27 ENCOUNTER — Encounter: Payer: Self-pay | Admitting: Emergency Medicine

## 2024-09-27 ENCOUNTER — Ambulatory Visit
Admission: EM | Admit: 2024-09-27 | Discharge: 2024-09-27 | Disposition: A | Discharge status: Home / Self Care | Attending: Emergency Medicine | Admitting: Emergency Medicine

## 2024-09-27 DIAGNOSIS — L710 Perioral dermatitis: Secondary | ICD-10-CM

## 2024-09-27 MED ORDER — ERYTHROMYCIN 2 % EX GEL: Freq: Every day | CUTANEOUS | 1 refills | Status: AC

## 2024-09-27 MED ORDER — PREDNISONE 10 MG (21) PO TBPK: ORAL_TABLET | ORAL | 0 refills | Status: AC

## 2024-09-27 NOTE — ED Provider Notes (Signed)
 HPI  SUBJECTIVE:  Chelsea Parks is a 28 y.o. female who presents with an erythematous, dry, flaky, papular rash on her face for the past 9 months.  She states that sometimes itches and burns, especially after washing her face.  Symptoms started after starting a new skin care product with retinol's and getting a Mirena  IUD.  She discontinued the skin care product in July.  She has tried triamcinolone cream, Dove antibacterial soap, gentle face lotions, CeraVe a healing ointment and Vanicream.  The lotions and ointments make things worse.  No recent change in medications.  No history of eczema, acne.  He has a past medical history of depression.  LMP: 3 days ago.  Denies the possibility of being pregnant.  PCP: She is at [facility], but is transferring to the [facility] clinic.  Patient had a e-visit on 5/2 and 04/15/2024 for facial rash.  She was referred to follow-up with [facility], but never heard from them, thus was lost to follow-up.  Past Medical History:  Diagnosis Date   Anxiety    Bipolar disorder (HCC) 2019   Depression    H/O postpartum depression, currently pregnant    Herpes genitalis in women    High cholesterol    Pelvic floor dysfunction in female 10/2019   PMDD (premenstrual dysphoric disorder)    PMDD (premenstrual dysphoric disorder)     Past Surgical History:  Procedure Laterality Date   CERVICAL BIOPSY     no surgical history      Family History  Problem Relation Age of Onset   Hypertension Mother    Hyperlipidemia Mother    Healthy Father    Pancreatic cancer Maternal Uncle    Stroke Maternal Grandfather    Cancer Maternal Grandfather    Lupus Paternal Aunt    GER disease Paternal Grandmother    Kidney cancer Neg Hx     Social History   Tobacco Use   Smoking status: Former   Smokeless tobacco: Never  Vaping Use   Vaping status: Never Used  Substance Use Topics   Alcohol use: Not Currently    Alcohol/week: 0.0 standard drinks of alcohol     Comment: occasionally   Drug use: No    No current facility-administered medications for this encounter.  Current Outpatient Medications:    EPINEPHrine  0.3 mg/0.3 mL IJ SOAJ injection, Inject 0.3 mg into the muscle., Disp: , Rfl:    erythromycin with ethanol (EMGEL) 2 % gel, Apply topically daily., Disp: 30 g, Rfl: 1   levonorgestrel  (MIRENA ) 20 MCG/DAY IUD, 1 each by Intrauterine route once., Disp: , Rfl:    predniSONE  (STERAPRED UNI-PAK 21 TAB) 10 MG (21) TBPK tablet, Dispense one 6 day pack. Take as directed with food., Disp: 21 tablet, Rfl: 0   albuterol  (VENTOLIN  HFA) 108 (90 Base) MCG/ACT inhaler, Inhale 2 puffs into the lungs every 4 (four) hours as needed for wheezing or shortness of breath., Disp: 18 g, Rfl: 2   cetirizine (ZYRTEC) 10 MG chewable tablet, 10 mg daily., Disp: , Rfl:    Prenatal Vit-Fe Fumarate-FA (MULTIVITAMIN-PRENATAL) 27-0.8 MG TABS tablet, Take 1 tablet by mouth daily at 12 noon., Disp: , Rfl:    valACYclovir  (VALTREX ) 500 MG tablet, Take 1 tablet (500 mg total) by mouth daily., Disp: 90 tablet, Rfl: 3  Allergies  Allergen Reactions   Amoxicillin Diarrhea     ROS  As noted in HPI.   Physical Exam  BP 103/81 (BP Location: Left Arm)  Pulse 82   Temp 98.4 F (36.9 C) (Oral)   Resp 16   Wt 59.9 kg   LMP 09/23/2024   SpO2 100%   BMI 23.38 kg/m   Constitutional: Well developed, well nourished, no acute distress Eyes:  EOMI, conjunctiva normal bilaterally HENT: Normocephalic, atraumatic,mucus membranes moist  Nontender, erythematous, papular dry, flaky rash under both eyes and around mouth.  Positive pustules.        Respiratory: Normal inspiratory effort Cardiovascular: Normal rate GI: nondistended skin: See ENT exam Musculoskeletal: no deformities Neurologic: Alert & oriented x 3, no focal neuro deficits Psychiatric: Speech and behavior appropriate   ED Course   Medications - No data to display  Orders Placed This Encounter   Procedures   Ambulatory referral to Dermatology    Referral Priority:   Urgent    Referral Type:   Consultation    Referral Reason:   Specialty Services Required    Requested Specialty:   Dermatology    Number of Visits Requested:   1    No results found for this or any previous visit (from the past 24 hours). No results found.  ED Clinical Impression  1. Perioral dermatitis      ED Assessment/Plan    Patient consented to the use of clinical photography for documentation purposes.  Presentation suggestive of perioral dermatitis.  Per up-to-date, topical steroids that are not advised.  Will try 6-day prednisone  taper and erythromycin 2% gel twice daily for 4 weeks per up-to-date recommendations.  May need to be on it for up to 8 weeks.  Will put in an urgent referral to dermatology.  Discussed MDM, treatment plan, and plan for follow-up with patient.  patient agrees with plan.   Meds ordered this encounter  Medications   erythromycin with ethanol (EMGEL) 2 % gel    Sig: Apply topically daily.    Dispense:  30 g    Refill:  1   predniSONE  (STERAPRED UNI-PAK 21 TAB) 10 MG (21) TBPK tablet    Sig: Dispense one 6 day pack. Take as directed with food.    Dispense:  21 tablet    Refill:  0      *This clinic note was created using Scientist, clinical (histocompatibility and immunogenetics). Therefore, there may be occasional mistakes despite careful proofreading.  ?    Van Knee, MD 09/30/24 1555

## 2024-09-27 NOTE — ED Triage Notes (Signed)
 Pt presents with an itchy rash on her face x 9 months. She had a tele-health visit several months ago but was advised to be seen in person. She has not applied anything to her face.

## 2024-09-27 NOTE — Discharge Instructions (Signed)
 Topical steroids are not recommended in perioral dermatitis.  Try the  6-day prednisone  taper.  We can also try erythromycin  ointment twice a day for 4 to 8 weeks.  I will put in a urgent dermatology referral.  Please follow-up with them ASAP.  Gentle skin cleansing practices (gently cleansing the skin with a fragrance-free, nonsoap cleanser promptly followed by complete and gentle rinsing of the cleanser from the skin)  Limiting the use of topical products (eg, cosmetics, sunscreens, emollients) on the face to the occasional only as needed application of a bland, nonocclusive moisturizing lotion

## 2024-09-29 ENCOUNTER — Other Ambulatory Visit: Payer: Self-pay

## 2024-09-29 ENCOUNTER — Ambulatory Visit

## 2024-09-29 DIAGNOSIS — L710 Perioral dermatitis: Secondary | ICD-10-CM

## 2024-09-29 MED ORDER — PIMECROLIMUS 1 % EX CREA: TOPICAL_CREAM | CUTANEOUS | 5 refills | Status: AC

## 2024-09-29 MED ORDER — TACROLIMUS 0.1 % EX OINT: TOPICAL_OINTMENT | CUTANEOUS | 5 refills | Status: AC

## 2024-09-29 MED ORDER — DOXYCYCLINE MONOHYDRATE 100 MG PO TABS: 100.0000 mg | ORAL_TABLET | Freq: Two times a day (BID) | ORAL | 2 refills | Status: DC

## 2024-09-29 NOTE — Patient Instructions (Signed)

## 2024-09-29 NOTE — Progress Notes (Signed)
    Subjective   Chelsea Parks is a 28 y.o. female who presents for the following: Rash. Patient is new patient  Today patient reports: Patient states since March she has a dry, flaky, redness, itchy, spot with pimples around nose area . She tried using [place] Pos[employer] products when she got her new job with [employer]. Then it started to spread around her mouth and under eyes, a few weeks ago. She stopped using all products since October, and has tried a lot of OTC products. when applying lotion it begins to burn. Vaseline does not irritate but tends to start to flare and clear fluid begins to come out. Vanicream burns.  Review of Systems:    No other skin or systemic complaints except as noted in HPI or Assessment and Plan.  The following portions of the chart were reviewed this encounter and updated as appropriate: medications, allergies, medical history  Relevant Medical History:  n/a   Objective  (SKPE) Well appearing patient in no apparent distress; mood and affect are within normal limits. Examination was performed of the: Focused Exam of: Face   Examination notable for: - Monomorphic erythematous papules on perialar skin and chin Examination limited by: Undergarments, Shoes or socks , and Clothing     Assessment & Plan  (SKAP)   Perioral (periorificial) dermatitis - severe, flaring  - Explained to patient that its a common facial skin problem in which groups of itchy or tender small red papules (bumps) appear around the mouth. - Recommended patient discontinue applying all face creams including topical steroids, cosmetics. Gentle cleansers and gentle moisturizers morning and night. - The rash may get worse for a few days before it starts to improve. - Start doxycycline  100 mg BID x 2-3 months  - Discussed side effects and precautions with doxycycline  including taking with meal, waiting at least 30 minutes before lying down at night, increased sun sensitivity, and to stop medication  if becomes pregnant or breastfeeding Start pimecrolimus 1% cream, apply to areas at face.     Level of service outlined above   Patient instructions (SKPI)   Procedures, orders, diagnosis for this visit:    There are no diagnoses linked to this encounter.  Return to clinic: Return in about 3 months (around 12/28/2024) for w/ Dr. Raymund.  I, Almetta Nora, RMA, am acting as scribe for Lauraine JAYSON Raymund, MD .   Documentation: I have reviewed the above documentation for accuracy and completeness, and I agree with the above.  Lauraine JAYSON Raymund, MD

## 2024-09-30 ENCOUNTER — Other Ambulatory Visit: Payer: Self-pay

## 2024-09-30 MED ORDER — DOXYCYCLINE MONOHYDRATE 100 MG PO CAPS: 100.0000 mg | ORAL_CAPSULE | Freq: Two times a day (BID) | ORAL | 2 refills | Status: AC

## 2024-10-12 DIAGNOSIS — Z03818 Encounter for observation for suspected exposure to other biological agents ruled out: Secondary | ICD-10-CM | POA: Diagnosis not present

## 2024-10-12 DIAGNOSIS — J019 Acute sinusitis, unspecified: Secondary | ICD-10-CM | POA: Diagnosis not present

## 2024-12-29 ENCOUNTER — Ambulatory Visit

## 2025-01-19 ENCOUNTER — Encounter
